# Patient Record
Sex: Female | Born: 1985 | Race: Black or African American | Hispanic: No | Marital: Single | Smoking: Former smoker
Health system: Southern US, Community
[De-identification: ages and names within clinical notes are randomized; demographics above are authoritative.]

## PROBLEM LIST (undated history)

## (undated) ENCOUNTER — Inpatient Hospital Stay (HOSPITAL_COMMUNITY): Payer: Self-pay

## (undated) ENCOUNTER — Emergency Department (HOSPITAL_COMMUNITY): Admission: EM | Discharge status: Against Medical Advice | Payer: Self-pay

## (undated) DIAGNOSIS — R12 Heartburn: Secondary | ICD-10-CM

## (undated) DIAGNOSIS — Z789 Other specified health status: Secondary | ICD-10-CM

## (undated) DIAGNOSIS — O3432 Maternal care for cervical incompetence, second trimester: Secondary | ICD-10-CM

## (undated) DIAGNOSIS — O26899 Other specified pregnancy related conditions, unspecified trimester: Secondary | ICD-10-CM

## (undated) DIAGNOSIS — O3440 Maternal care for other abnormalities of cervix, unspecified trimester: Secondary | ICD-10-CM

## (undated) DIAGNOSIS — O26872 Cervical shortening, second trimester: Secondary | ICD-10-CM

## (undated) DIAGNOSIS — R519 Headache, unspecified: Secondary | ICD-10-CM

## (undated) DIAGNOSIS — O2230 Deep phlebothrombosis in pregnancy, unspecified trimester: Secondary | ICD-10-CM

## (undated) DIAGNOSIS — R51 Headache: Secondary | ICD-10-CM

## (undated) HISTORY — PX: OTHER SURGICAL HISTORY: SHX169

## (undated) HISTORY — PX: INDUCED ABORTION: SHX677

## (undated) HISTORY — PX: AUGMENTATION MAMMAPLASTY: SUR837

## (undated) HISTORY — PX: WISDOM TOOTH EXTRACTION: SHX21

---
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
## 2007-01-24 ENCOUNTER — Emergency Department (HOSPITAL_COMMUNITY): Admission: EM | Admit: 2007-01-24 | Discharge: 2007-01-24 | Payer: Self-pay | Admitting: Emergency Medicine

## 2007-08-02 ENCOUNTER — Ambulatory Visit (HOSPITAL_COMMUNITY): Admission: RE | Admit: 2007-08-02 | Discharge: 2007-08-02 | Payer: Self-pay | Admitting: Obstetrics and Gynecology

## 2007-08-29 ENCOUNTER — Inpatient Hospital Stay (HOSPITAL_COMMUNITY): Admission: AD | Admit: 2007-08-29 | Discharge: 2007-08-29 | Payer: Self-pay | Admitting: Obstetrics and Gynecology

## 2007-09-02 ENCOUNTER — Inpatient Hospital Stay (HOSPITAL_COMMUNITY): Admission: AD | Admit: 2007-09-02 | Discharge: 2007-09-04 | Payer: Self-pay | Admitting: Obstetrics and Gynecology

## 2007-09-02 ENCOUNTER — Encounter (INDEPENDENT_AMBULATORY_CARE_PROVIDER_SITE_OTHER): Payer: Self-pay | Admitting: Obstetrics and Gynecology

## 2008-08-10 ENCOUNTER — Emergency Department (HOSPITAL_COMMUNITY): Admission: EM | Admit: 2008-08-10 | Discharge: 2008-08-10 | Payer: Self-pay | Admitting: Emergency Medicine

## 2010-07-23 ENCOUNTER — Inpatient Hospital Stay (INDEPENDENT_AMBULATORY_CARE_PROVIDER_SITE_OTHER)
Admission: RE | Admit: 2010-07-23 | Discharge: 2010-07-23 | Disposition: A | Discharge status: Home / Self Care | Payer: Self-pay | Source: Ambulatory Visit | Attending: Emergency Medicine | Admitting: Emergency Medicine

## 2010-07-23 DIAGNOSIS — J020 Streptococcal pharyngitis: Secondary | ICD-10-CM

## 2010-07-23 LAB — POCT URINALYSIS DIPSTICK
Bilirubin Urine: NEGATIVE
Hgb urine dipstick: NEGATIVE
Ketones, ur: NEGATIVE mg/dL
Nitrite: NEGATIVE
Protein, ur: 30 mg/dL — AB
Specific Gravity, Urine: 1.025 (ref 1.005–1.030)
Urine Glucose, Fasting: NEGATIVE mg/dL
Urobilinogen, UA: 1 mg/dL (ref 0.0–1.0)
pH: 6 (ref 5.0–8.0)

## 2010-07-23 LAB — POCT RAPID STREP A (OFFICE): Streptococcus, Group A Screen (Direct): POSITIVE — AB

## 2010-07-23 LAB — POCT PREGNANCY, URINE: Preg Test, Ur: NEGATIVE

## 2010-10-21 NOTE — Discharge Summary (Signed)
Erin Boyd, Erin Boyd       ACCOUNT NO.:  [id number]   MEDICAL RECORD NO.:  [id number]          PATIENT TYPE:  INP   LOCATION:  9145                          FACILITY:  [facility]   PHYSICIAN:  Malachi Pro. Ambrose Mantle, M.D. DATE OF BIRTH:  11-21-1985   DATE OF ADMISSION:  09/02/2007  DATE OF DISCHARGE:  09/04/2007                               DISCHARGE SUMMARY   HISTORY OF PRESENT ILLNESS:  A 25 year old black single female para 0-0-  1-0, gravida 2, EDC September 08, 2007, admitted in active labor.  Blood  group and type A positive, negative antibody, nonreactive serology,  rubella none immune, hepatitis B surface antigen negative, HIV negative,  hemoglobin AA, GC and Chlamydia negative, 1-hour glucola 113, group B  strep positive, and at 36 weeks, the Chlamydia was positive.   Ultrasound on February 01, 2007 showed [facility] of September 08, 2007.  Pap smear  was ASCUS-positive, high risk.  Colposcopy showed no significant  lesions.  Ultrasound on April 20, 2007, showed an average gestational  age of [redacted] weeks and 4 days with an EDC of _March 30, 2009.  The patient  was treated with azithromycin on August 08, 2007, for positive Chlamydia.  She began contracting on the day of admission.  Office exam showed the  cervix to be 4 cm.  She came to maternity admission for evaluation.  She  progressed and received an epidural.   PAST MEDICAL HISTORY:  Revealed no known allergies.  No operations, no  illnesses.  She did have a chlamydia in 2008 and again at [redacted] weeks  gestation.   FAMILY HISTORY:  Negative.   SOCIAL HISTORY:  Alcohol, tobacco, and drugs none.   OBSTETRIC HISTORY:  In 2008, she had an early abortion.   PHYSICAL EXAMINATION:  On admission was normal.  VITAL SIGNS:  Normal.  HEART: Normal.  LUNGS: Normal.  ABDOMEN: Soft.  Fundal height 39 cm.  Fetal heart tones normal.  The  cervix was 10 cm when I first examined her in the hospital.  Vertex was  OA at +3 station.   She pushed great and  delivered spontaneously OA over an intact  peritoneum a living female infant of 7 pounds and 7 ounces with Apgars  of 9 at 1 and 9 at 5 minutes.  The placenta was intact.  The uterus was  normal.  Blood loss about 400 mL.  Bilateral labial lacerations were  hemostatic and not repaired.  Postpartum, the patient did well and was  discharged on the second postpartum day.   LABORATORY DATA:  She had an initial hemoglobin of 12.2, hematocrit of  36.3, white count 10,900, and platelet count 180,000.  Followup  hemoglobin 11.3.   FINAL DIAGNOSIS:  Intrauterine pregnancy, 39 weeks, delivered  occipitoanterior.   OPERATION:  Spontaneous delivery OA.   FINAL CONDITION:  Improved.   INSTRUCTIONS:  1. Include a regular discharge instruction booklet.  2. The patient is advised to return to the office in 6 weeks.  3. Percocet 5/325, 30 tablets 1 q.4-6 h. as needed for pain.  4. Motrin 600 mg, 30 tablets 1 q.6 h. as needed for pain  given at      discharge.      Malachi Pro. Ambrose Mantle, M.D.  Electronically Signed     TFH/MEDQ  D:  09/04/2007  T:  09/05/2007  Job:  [id number]

## 2011-02-26 ENCOUNTER — Inpatient Hospital Stay (HOSPITAL_COMMUNITY)
Admission: AD | Admit: 2011-02-26 | Discharge: 2011-02-27 | Disposition: A | Discharge status: Home / Self Care | Payer: Self-pay | Source: Ambulatory Visit | Attending: Obstetrics and Gynecology | Admitting: Obstetrics and Gynecology

## 2011-02-26 DIAGNOSIS — N760 Acute vaginitis: Secondary | ICD-10-CM

## 2011-02-26 DIAGNOSIS — A499 Bacterial infection, unspecified: Secondary | ICD-10-CM

## 2011-02-26 DIAGNOSIS — B9689 Other specified bacterial agents as the cause of diseases classified elsewhere: Secondary | ICD-10-CM

## 2011-02-26 DIAGNOSIS — R109 Unspecified abdominal pain: Secondary | ICD-10-CM | POA: Insufficient documentation

## 2011-02-26 NOTE — Progress Notes (Signed)
Pt LMP 01/09/2011, G2 P1, having lower abd and back pain, pain with urination and vaginal discharge.

## 2011-02-27 ENCOUNTER — Encounter (HOSPITAL_COMMUNITY): Payer: Self-pay | Admitting: *Deleted

## 2011-02-27 LAB — URINALYSIS, ROUTINE W REFLEX MICROSCOPIC
Glucose, UA: NEGATIVE mg/dL
Ketones, ur: NEGATIVE mg/dL
Protein, ur: NEGATIVE mg/dL
Urobilinogen, UA: 1 mg/dL (ref 0.0–1.0)

## 2011-02-27 LAB — CBC
HCT: 39.7 % (ref 36.0–46.0)
MCHC: 32.5 g/dL (ref 30.0–36.0)
MCV: 90.2 fL (ref 78.0–100.0)
RDW: 14 % (ref 11.5–15.5)

## 2011-02-27 LAB — WET PREP, GENITAL

## 2011-02-27 LAB — URINE MICROSCOPIC-ADD ON

## 2011-02-27 MED ORDER — METRONIDAZOLE 500 MG PO TABS: 500.0000 mg | ORAL_TABLET | Freq: Two times a day (BID) | ORAL | Status: AC

## 2011-02-27 MED ORDER — IBUPROFEN 800 MG PO TABS: 800.0000 mg | ORAL_TABLET | Freq: Once | ORAL | Status: DC

## 2011-02-27 NOTE — Progress Notes (Signed)
Patient states she has been having lower abdominal and back pain . Pt also noticed blood in her urine

## 2011-02-27 NOTE — ED Provider Notes (Signed)
History     Chief Complaint  Patient presents with  . Abdominal Pain  . Back Pain  . Dysuria  . Vaginal Discharge   HPI C/O low abd and back pain, + dysuria. No fever, chills, nausea/vomiting. Unsure if pregnant, LMP 02/05/11.   OB History    Grav Para Term Preterm Abortions TAB SAB Ect Mult Living   2 1 1  0 1 0 1 0 0 1      No past medical history on file.  Past Surgical History  Procedure Date  . Pediatric dental surgery     No family history on file.  History  Substance Use Topics  . Smoking status: Current Everyday Smoker -- 0.2 packs/day  . Smokeless tobacco: Never Used  . Alcohol Use: 2.4 oz/week    4 Glasses of wine per week    Allergies: No Known Allergies  No prescriptions prior to admission    Review of Systems  Constitutional: Negative.  Negative for fever and chills.  Respiratory: Negative.   Cardiovascular: Negative.   Gastrointestinal: Positive for abdominal pain. Negative for nausea, vomiting, diarrhea and constipation.  Genitourinary: Positive for dysuria and hematuria. Negative for urgency, frequency and flank pain.       Negative for vaginal bleeding, vaginal discharge, dyspareunia  Musculoskeletal: Positive for back pain.  Neurological: Negative.   Psychiatric/Behavioral: Negative.    Physical Exam   Blood pressure 123/67, pulse 69, temperature 98.8 F (37.1 C), temperature source Oral, resp. rate 18, height 5' 9.5" (1.765 m), weight 98.068 kg (216 lb 3.2 oz), last menstrual period 01/09/2011.  Physical Exam  Nursing note and vitals reviewed. Constitutional: She is oriented to person, place, and time. She appears well-developed and well-nourished. No distress.  HENT:  Head: Normocephalic and atraumatic.  Cardiovascular: Normal rate.   Respiratory: Effort normal.  GI: Soft. Bowel sounds are normal. She exhibits no mass. There is no tenderness. There is no rebound and no guarding.  Genitourinary: There is no rash or lesion on the right  labia. There is no rash or lesion on the left labia. Uterus is not enlarged and not tender. Cervix exhibits no motion tenderness, no discharge and no friability. Right adnexum displays no mass, no tenderness and no fullness. Left adnexum displays no mass, no tenderness and no fullness. No tenderness or bleeding around the vagina. Vaginal discharge (white) found.  Musculoskeletal: Normal range of motion.  Neurological: She is alert and oriented to person, place, and time.  Skin: Skin is warm and dry.  Psychiatric: She has a normal mood and affect.    MAU Course  Procedures  Results for orders placed during the hospital encounter of 02/26/11 (from the past 24 hour(s))  URINALYSIS, ROUTINE W REFLEX MICROSCOPIC     Status: Abnormal   Collection Time   02/26/11 11:50 PM      Component Value Range   Color, Urine YELLOW  YELLOW    Appearance HAZY (*) CLEAR    Specific Gravity, Urine 1.015  1.005 - 1.030    pH 7.5  5.0 - 8.0    Glucose, UA NEGATIVE  NEGATIVE (mg/dL)   Hgb urine dipstick SMALL (*) NEGATIVE    Bilirubin Urine NEGATIVE  NEGATIVE    Ketones, ur NEGATIVE  NEGATIVE (mg/dL)   Protein, ur NEGATIVE  NEGATIVE (mg/dL)   Urobilinogen, UA 1.0  0.0 - 1.0 (mg/dL)   Nitrite NEGATIVE  NEGATIVE    Leukocytes, UA SMALL (*) NEGATIVE   URINE MICROSCOPIC-ADD ON  Status: Abnormal   Collection Time   02/26/11 11:50 PM      Component Value Range   Squamous Epithelial / LPF FEW (*) RARE    WBC, UA 11-20  <3 (WBC/hpf)   RBC / HPF 3-6  <3 (RBC/hpf)   Bacteria, UA MANY (*) RARE   CBC     Status: Normal   Collection Time   02/27/11  1:05 AM      Component Value Range   WBC 8.2  4.0 - 10.5 (K/uL)   RBC 4.40  3.87 - 5.11 (MIL/uL)   Hemoglobin 12.9  12.0 - 15.0 (g/dL)   HCT 16.1  09.6 - 04.5 (%)   MCV 90.2  78.0 - 100.0 (fL)   MCH 29.3  26.0 - 34.0 (pg)   MCHC 32.5  30.0 - 36.0 (g/dL)   RDW 40.9  81.1 - 91.4 (%)   Platelets 183  150 - 400 (K/uL)  WET PREP, GENITAL     Status: Abnormal    Collection Time   02/27/11  1:20 AM      Component Value Range   Yeast, Wet Prep NONE SEEN  NONE SEEN    Trich, Wet Prep NONE SEEN  NONE SEEN    Clue Cells, Wet Prep MODERATE (*) NONE SEEN    WBC, Wet Prep HPF POC MODERATE (*) NONE SEEN   POCT PREGNANCY, URINE     Status: Normal   Collection Time   02/27/11  1:52 AM      Component Value Range   Preg Test, Ur NEGATIVE       Assessment and Plan  Bacterial vaginosis - rx flagyl F/U PRN   Abdelaziz Westenberger 02/27/2011, 3:32 AM

## 2011-02-28 LAB — GC/CHLAMYDIA PROBE AMP, GENITAL: Chlamydia, DNA Probe: NEGATIVE

## 2011-03-02 LAB — CBC
HCT: 33 — ABNORMAL LOW
HCT: 36.3
Hemoglobin: 11.3 — ABNORMAL LOW
MCV: 88.9
RBC: 3.74 — ABNORMAL LOW
RBC: 4.08
RDW: 14.4
WBC: 10.9 — ABNORMAL HIGH

## 2011-03-02 LAB — RPR: RPR Ser Ql: NONREACTIVE

## 2011-03-04 NOTE — ED Provider Notes (Signed)
Agree with above note.  Erin Boyd 03/04/2011 9:11 AM

## 2011-04-15 ENCOUNTER — Emergency Department (INDEPENDENT_AMBULATORY_CARE_PROVIDER_SITE_OTHER)
Admission: EM | Admit: 2011-04-15 | Discharge: 2011-04-15 | Disposition: A | Discharge status: Home / Self Care | Payer: Self-pay | Source: Home / Self Care | Attending: Emergency Medicine | Admitting: Emergency Medicine

## 2011-04-15 ENCOUNTER — Encounter (HOSPITAL_COMMUNITY): Payer: Self-pay | Admitting: *Deleted

## 2011-04-15 DIAGNOSIS — N898 Other specified noninflammatory disorders of vagina: Secondary | ICD-10-CM

## 2011-04-15 DIAGNOSIS — R05 Cough: Secondary | ICD-10-CM

## 2011-04-15 DIAGNOSIS — M778 Other enthesopathies, not elsewhere classified: Secondary | ICD-10-CM

## 2011-04-15 DIAGNOSIS — R059 Cough, unspecified: Secondary | ICD-10-CM

## 2011-04-15 DIAGNOSIS — M65849 Other synovitis and tenosynovitis, unspecified hand: Secondary | ICD-10-CM

## 2011-04-15 LAB — POCT URINALYSIS DIP (DEVICE)
Ketones, ur: NEGATIVE mg/dL
Leukocytes, UA: NEGATIVE
Nitrite: NEGATIVE
Protein, ur: NEGATIVE mg/dL
Urobilinogen, UA: 1 mg/dL (ref 0.0–1.0)
pH: 5.5 (ref 5.0–8.0)

## 2011-04-15 LAB — WET PREP, GENITAL
Trich, Wet Prep: NONE SEEN
Yeast Wet Prep HPF POC: NONE SEEN

## 2011-04-15 LAB — POCT PREGNANCY, URINE: Preg Test, Ur: NEGATIVE

## 2011-04-15 MED ORDER — DICLOFENAC SODIUM 75 MG PO TBEC: 75.0000 mg | DELAYED_RELEASE_TABLET | ORAL | Status: DC

## 2011-04-15 MED ORDER — BENZONATATE 100 MG PO CAPS: 100.0000 mg | ORAL_CAPSULE | Freq: Three times a day (TID) | ORAL | Status: AC | PRN

## 2011-04-15 MED ORDER — METRONIDAZOLE 500 MG PO TABS: 500.0000 mg | ORAL_TABLET | Freq: Two times a day (BID) | ORAL | Status: AC

## 2011-04-15 NOTE — ED Provider Notes (Addendum)
History     CSN: [id number] Arrival date & time: 04/15/2011 11:08 AM   First MD Initiated Contact with Patient 04/15/11 1124      Chief Complaint  Patient presents with  . Vaginal Discharge    pt with onset of vaginal discharge with odor x 5 days - dry cough x 2 days and pain right thumb  -  . Cough   COUGH, x 1 day "dry",,,,no fevers no congestion (CHPI Comments: R thumb pain and swelling...with tingling sensation points to dorsal aspect ...of R thumb, the swelling has gone down.....     Patient is a 25 y.o. female presenting with vaginal discharge, cough, and vaginal itching. The history is provided by the patient.  Vaginal Discharge This is a new problem. The current episode started more than 1 week ago. The problem occurs constantly. The problem has not changed since onset.Pertinent negatives include no chest pain, no abdominal pain and no headaches. The symptoms are aggravated by nothing. She has tried nothing for the symptoms. The treatment provided no relief.  Cough This is a new problem. The current episode started 12 to 24 hours ago. The problem has not changed since onset.There has been no fever. Pertinent negatives include no chest pain, no chills, no ear congestion, no headaches and no myalgias. She has tried nothing for the symptoms. She is not a smoker.  Vaginal Itching This is a new problem. The problem occurs constantly. Pertinent negatives include no chest pain, no abdominal pain and no headaches.   Having a strong odor with this discharge,,, History reviewed. No pertinent past medical history.  Past Surgical History  Procedure Date  . Pediatric dental surgery     History reviewed. No pertinent family history.  History  Substance Use Topics  . Smoking status: Current Everyday Smoker -- 0.2 packs/day  . Smokeless tobacco: Never Used  . Alcohol Use: 2.4 oz/week    4 Glasses of wine per week    OB History    Grav Para Term Preterm Abortions TAB SAB Ect Mult  Living   2 1 1  0 1 0 1 0 0 1      Review of Systems  Constitutional: Negative.  Negative for fever and chills.  Respiratory: Positive for cough.   Cardiovascular: Negative for chest pain.  Gastrointestinal: Negative for abdominal pain.  Genitourinary: Positive for vaginal discharge.  Musculoskeletal: Negative for myalgias.  Neurological: Negative for weakness and headaches.    Allergies  Review of patient's allergies indicates no known allergies.  Home Medications  No current outpatient prescriptions on file.  LMP 03/25/2011  Physical Exam  Nursing note reviewed. Constitutional: She appears well-developed and well-nourished.  Genitourinary: Vaginal discharge found.  Musculoskeletal:       Right wrist: She exhibits tenderness. She exhibits normal range of motion, no crepitus and no deformity.       Arms: Neurological: She is alert.  Skin: Skin is warm. No erythema.    ED Course  Procedures (including critical care time)   Labs Reviewed  POCT URINALYSIS DIPSTICK  POCT PREGNANCY, URINE  WET PREP, GENITAL  GC/CHLAMYDIA PROBE AMP, GENITAL   No results found.   No diagnosis found.    MDM  3 CONDITIONS- COUGH < 24 HRS- THUMB PAIN- AND VAGINAL DISCHARGE        Jimmie Molly, MD 04/15/11 1224  Jimmie Molly, MD 04/15/11 1227  Jimmie Molly, MD 04/15/11 1749  Jimmie Molly, MD 04/15/11 1752  Jimmie Molly, MD 04/15/11 1755

## 2011-04-16 LAB — GC/CHLAMYDIA PROBE AMP, GENITAL
Chlamydia, DNA Probe: NEGATIVE
GC Probe Amp, Genital: NEGATIVE

## 2011-04-17 NOTE — ED Notes (Signed)
GC neg., Chlamydia neg., Wet prep: Mod. Clue cells, mod. WBC's. Pt. adequately treated with Flagyl. No further treatment needed.

## 2011-06-03 ENCOUNTER — Inpatient Hospital Stay (HOSPITAL_BASED_OUTPATIENT_CLINIC_OR_DEPARTMENT_OTHER)
Admission: EM | Admit: 2011-06-03 | Discharge: 2011-06-03 | Disposition: A | Discharge status: Home / Self Care | Payer: Self-pay | Attending: Obstetrics and Gynecology | Admitting: Obstetrics and Gynecology

## 2011-06-03 ENCOUNTER — Encounter (HOSPITAL_COMMUNITY): Payer: Self-pay | Admitting: *Deleted

## 2011-06-03 ENCOUNTER — Inpatient Hospital Stay (HOSPITAL_COMMUNITY): Payer: Self-pay

## 2011-06-03 ENCOUNTER — Other Ambulatory Visit: Payer: Self-pay | Admitting: Obstetrics and Gynecology

## 2011-06-03 ENCOUNTER — Inpatient Hospital Stay (HOSPITAL_COMMUNITY)
Admission: AD | Admit: 2011-06-03 | Discharge: 2011-06-03 | Disposition: A | Discharge status: Home / Self Care | Payer: Self-pay | Source: Ambulatory Visit | Attending: Obstetrics and Gynecology | Admitting: Obstetrics and Gynecology

## 2011-06-03 ENCOUNTER — Encounter (HOSPITAL_BASED_OUTPATIENT_CLINIC_OR_DEPARTMENT_OTHER): Payer: Self-pay | Admitting: *Deleted

## 2011-06-03 DIAGNOSIS — O209 Hemorrhage in early pregnancy, unspecified: Secondary | ICD-10-CM | POA: Insufficient documentation

## 2011-06-03 DIAGNOSIS — O26899 Other specified pregnancy related conditions, unspecified trimester: Secondary | ICD-10-CM

## 2011-06-03 DIAGNOSIS — R102 Pelvic and perineal pain: Secondary | ICD-10-CM

## 2011-06-03 DIAGNOSIS — N949 Unspecified condition associated with female genital organs and menstrual cycle: Secondary | ICD-10-CM

## 2011-06-03 HISTORY — DX: Other specified health status: Z78.9

## 2011-06-03 LAB — CBC
HCT: 37 % (ref 36.0–46.0)
MCH: 30.6 pg (ref 26.0–34.0)
MCV: 87.7 fL (ref 78.0–100.0)
MCV: 89.4 fL (ref 78.0–100.0)
Platelets: 169 10*3/uL (ref 150–400)
RBC: 4.22 MIL/uL (ref 3.87–5.11)
RDW: 13.2 % (ref 11.5–15.5)
RDW: 13.2 % (ref 11.5–15.5)
WBC: 7.8 10*3/uL (ref 4.0–10.5)
WBC: 7.9 10*3/uL (ref 4.0–10.5)

## 2011-06-03 LAB — WET PREP, GENITAL
Clue Cells Wet Prep HPF POC: NONE SEEN
Trich, Wet Prep: NONE SEEN
Yeast Wet Prep HPF POC: NONE SEEN

## 2011-06-03 LAB — URINALYSIS, ROUTINE W REFLEX MICROSCOPIC
Bilirubin Urine: NEGATIVE
Glucose, UA: NEGATIVE mg/dL
Ketones, ur: NEGATIVE mg/dL
Protein, ur: NEGATIVE mg/dL
Urobilinogen, UA: 1 mg/dL (ref 0.0–1.0)

## 2011-06-03 LAB — BASIC METABOLIC PANEL
CO2: 22 mEq/L (ref 19–32)
Calcium: 9.1 mg/dL (ref 8.4–10.5)
Creatinine, Ser: 0.6 mg/dL (ref 0.50–1.10)
GFR calc Af Amer: >90 mL/min (ref >90)
GFR calc non Af Amer: >90 mL/min (ref >90)
Sodium: 138 mEq/L (ref 135–145)

## 2011-06-03 LAB — DIFFERENTIAL
Basophils Absolute: 0 10*3/uL (ref 0.0–0.1)
Eosinophils Relative: 2 % (ref 0–5)
Lymphocytes Relative: 39 % (ref 12–46)
Lymphs Abs: 3.1 10*3/uL (ref 0.7–4.0)
Monocytes Absolute: 0.8 10*3/uL (ref 0.1–1.0)
Neutro Abs: 3.8 10*3/uL (ref 1.7–7.7)

## 2011-06-03 LAB — PROTIME-INR: Prothrombin Time: 12.8 seconds (ref 11.6–15.2)

## 2011-06-03 LAB — ABO/RH: ABO/RH(D): A POS

## 2011-06-03 LAB — URINE MICROSCOPIC-ADD ON

## 2011-06-03 MED ORDER — ONDANSETRON HCL 4 MG/2ML IJ SOLN
4.0000 mg | Freq: Once | INTRAMUSCULAR | Status: AC
  Administered 2011-06-03: 4 mg via INTRAVENOUS

## 2011-06-03 MED ORDER — KETOROLAC TROMETHAMINE 60 MG/2ML IM SOLN
60.0000 mg | Freq: Once | INTRAMUSCULAR | Status: AC
  Administered 2011-06-03: 60 mg via INTRAMUSCULAR
  Filled 2011-06-03: qty 2

## 2011-06-03 MED ORDER — OXYCODONE-ACETAMINOPHEN 5-325 MG PO TABS: 1.0000 | ORAL_TABLET | ORAL | Status: AC | PRN

## 2011-06-03 MED ORDER — ONDANSETRON HCL 4 MG/2ML IJ SOLN
INTRAMUSCULAR | Status: AC
  Administered 2011-06-03: 4 mg via INTRAVENOUS
  Filled 2011-06-03: qty 2

## 2011-06-03 MED ORDER — MORPHINE SULFATE 4 MG/ML IJ SOLN
4.0000 mg | Freq: Once | INTRAMUSCULAR | Status: AC
  Administered 2011-06-03: 4 mg via INTRAVENOUS
  Filled 2011-06-03: qty 1

## 2011-06-03 NOTE — ED Provider Notes (Signed)
History     Chief Complaint  Patient presents with  . Abdominal Pain  . Vaginal Bleeding   HPI Erin Boyd 25 y.o. G3P1 at [redacted]w[redacted]d sent from [facility] for ultrasound and further evaluation.  [facility] had consulted with Dr. Senaida Ores who accepted client for transfer.   OB History    Grav Para Term Preterm Abortions TAB SAB Ect Mult Living   3 1 1  0 1 0 1 0 0 1      History reviewed. No pertinent past medical history.  Past Surgical History  Procedure Date  . Pediatric dental surgery     History reviewed. No pertinent family history.  History  Substance Use Topics  . Smoking status: Current Everyday Smoker -- 0.2 packs/day  . Smokeless tobacco: Never Used  . Alcohol Use: 2.4 oz/week    4 Glasses of wine per week    Allergies: No Known Allergies   (Not in a hospital admission)  Review of Systems  Gastrointestinal: Positive for abdominal pain. Negative for nausea, vomiting and diarrhea.   Physical Exam   Blood pressure 114/52, pulse 68, temperature 98.8 F (37.1 C), temperature source Oral, resp. rate 18, height 5\' 9"  (1.753 m), weight 220 lb (99.791 kg), last menstrual period 03/28/2011, SpO2 98.00%.  Physical Exam  Nursing note and vitals reviewed. Constitutional: She is oriented to person, place, and time. She appears well-developed and well-nourished.       Client very drowsy.    HENT:  Head: Normocephalic.  Eyes: EOM are normal.  Neck: Neck supple.  Musculoskeletal: Normal range of motion.  Neurological: She is alert and oriented to person, place, and time.  Skin: Skin is warm and dry.  Psychiatric: She has a normal mood and affect.    MAU Course  Procedures TRANSVAGINAL OBSTETRIC US  Technique: Transvaginal ultrasound was performed for complete  evaluation of the gestation as well as the maternal uterus, adnexal  regions, and pelvic cul-de-sac.  Comparison: Prior ultrasound of pregnancy performed 06/30/2006    Intrauterine gestational sac: Visualized/normal in shape.  Yolk sac: No  Embryo: No  Cardiac Activity: N/A  MSD: 10.9 mm 5 w 6 d [place] EDC: 01/28/2012  Subchorionic hemorrhage: None seen.  Maternal uterus/adnexae:  The uterus is otherwise unremarkable in appearance.  The patient demonstrates significant left-sided tenderness.  Thorough evaluation of the left adnexa demonstrates no evidence for  ectopic pregnancy.  The ovaries are unremarkable in appearance. The right ovary  measures 3.6 x 2.7 x 1.6 cm, while the left ovary measures 3.7 x  2.3 x 2.2 cm. There is no evidence for ovarian torsion; no  suspicious adnexal masses are seen.  No free fluid is seen within the pelvic cul-de-sac.  IMPRESSION:  1. Single intrauterine gestational sac noted, with a mean sac  diameter of 10.9 mm. No embryo is yet seen. This corresponds to a  gestational age of [redacted] weeks 6 days, which does not match the  gestational age by LMP, and reflects a new estimated date of  delivery of January 28, 2012.  2. Significant left-sided adnexal tenderness; no corresponding  abnormality characterized on ultrasound. No evidence for ectopic  pregnancy at this time  MDM Dr. Ambrose Mantle in to see client and discussed plan of care.  Client decided to be seen in the office on Friday for repeat quant there.  Assessment and Plan  Bleeding in early pregnancy.  Plan Be seen in the office on Friday for repeat quant.  Call the office today to schedule an appointment. Note for work given for today. BURLESON,TERRI 06/03/2011, 8:43 AM   Nolene Bernheim, NP 06/03/11 [phone]

## 2011-06-03 NOTE — Progress Notes (Signed)
Pt presents to [facility].  Arrives for lower abdominal pain and cramping and bleeding that started around 11pm last night.

## 2011-06-03 NOTE — Progress Notes (Signed)
Pt states was seen this am for bleeding, now having clots. Noted clot tennis ball sized. Changing pad q30 minutes.

## 2011-06-03 NOTE — ED Notes (Signed)
Pt given zofran prior to transfer d/t nausea.

## 2011-06-03 NOTE — ED Provider Notes (Signed)
History     Chief Complaint  Patient presents with  . Vaginal Bleeding   HPI Erin Boyd 25 y.o. left earlier this morning. 5w 6d gestation.   Went to bed and slept.  Awakened with heavy bleeding and clotting.  Returns for further evaluation.   OB History    Grav Para Term Preterm Abortions TAB SAB Ect Mult Living   4 1 1  0 1 0 1 0 0 1      Past Medical History  Diagnosis Date  . No pertinent past medical history     Past Surgical History  Procedure Date  . Pediatric dental surgery   . Induced abortion     No family history on file.  History  Substance Use Topics  . Smoking status: Current Everyday Smoker -- 0.2 packs/day  . Smokeless tobacco: Never Used  . Alcohol Use: 2.4 oz/week    4 Glasses of wine per week    Allergies: No Known Allergies  Prescriptions prior to admission  Medication Sig Dispense Refill  . ibuprofen (ADVIL,MOTRIN) 200 MG tablet Take 400 mg by mouth every 6 (six) hours as needed. Takes for headache         ROS Physical Exam   Blood pressure 117/55, pulse 69, temperature 98.5 F (36.9 C), resp. rate 18, height 5\' 9"  (1.753 m), weight 220 lb 6 oz (99.961 kg), last menstrual period 03/28/2011.  Physical Exam  MAU Course  Procedures  MDM Care assumed by M. Mayford Knife, CNM  Assessment and Plan    Ugo Thoma 06/03/2011, 7:40 PM   Nolene Bernheim, NP 06/03/11 2000

## 2011-06-03 NOTE — ED Notes (Signed)
Pt was seen in MAU earlier today with u/s and labs done; pt continues to have moderate bleeding and cramping; pt rates her cramps at 5-10; pt had intercourse yesterday; G4P1;

## 2011-06-03 NOTE — ED Provider Notes (Addendum)
History     CSN: [id number]  Arrival date & time 06/03/11  0051   First MD Initiated Contact with Patient 06/03/11 [phone]      Chief Complaint  Patient presents with  . Abdominal Pain  . Vaginal Bleeding    (Consider location/radiation/quality/duration/timing/severity/associated sxs/prior treatment) HPI Comments: The patient is a 25 year old female, G3 P1 A2 (1 spontaneous miscarriage, one induced abortion) with a last menstrual period in late October who tells me that she took a urine pregnancy test a month ago that was positive. She reports that 2 weeks ago she was having some brown vaginal discharge that stopped after a day, and 2 days ago she said that she expelled a "slimy plug" from her vagina, and reports that today at approximately 11 PM, she had onset of left lower abdominal pain, sharp, cramping, and vaginal bleeding with flow approximately equal to the menstrual period for her. She denies any abdominal pain otherwise, nausea, vomiting, diarrhea, reports last normal bowel movement yesterday morning. She is awake, alert, and oriented appropriately, with warm and dry skin, in no apparent distress. Vital signs are stable.  Patient is a 25 y.o. female presenting with abdominal pain and vaginal bleeding. The history is provided by the patient.  Abdominal Pain The primary symptoms of the illness include abdominal pain and vaginal bleeding. The primary symptoms of the illness do not include fever, fatigue, shortness of breath, nausea, vomiting, diarrhea, dysuria or vaginal discharge.  Symptoms associated with the illness do not include chills, diaphoresis, urgency, hematuria or frequency.  Vaginal Bleeding Associated symptoms include abdominal pain. Pertinent negatives include no chest pain, no headaches and no shortness of breath.    History reviewed. No pertinent past medical history.  Past Surgical History  Procedure Date  . Pediatric dental surgery     History reviewed. No  pertinent family history.  History  Substance Use Topics  . Smoking status: Current Everyday Smoker -- 0.2 packs/day  . Smokeless tobacco: Never Used  . Alcohol Use: 2.4 oz/week    4 Glasses of wine per week    OB History    Grav Para Term Preterm Abortions TAB SAB Ect Mult Living   3 1 1  0 1 0 1 0 0 1      Review of Systems  Constitutional: Negative for fever, chills, diaphoresis, activity change, appetite change, fatigue and unexpected weight change.  HENT: Negative for facial swelling.   Eyes: Negative.   Respiratory: Negative.  Negative for cough and shortness of breath.   Cardiovascular: Negative.  Negative for chest pain.  Gastrointestinal: Positive for abdominal pain. Negative for nausea, vomiting, diarrhea, blood in stool, abdominal distention, anal bleeding and rectal pain.  Genitourinary: Positive for vaginal bleeding and menstrual problem. Negative for dysuria, urgency, frequency, hematuria, flank pain, decreased urine volume, vaginal discharge, difficulty urinating, genital sores, vaginal pain and pelvic pain.  Musculoskeletal: Negative.   Skin: Negative for color change, pallor and rash.  Neurological: Negative for weakness, light-headedness, numbness and headaches.  Hematological: Does not bruise/bleed easily.  Psychiatric/Behavioral: Negative.     Allergies  Review of patient's allergies indicates no known allergies.  Home Medications   Current Outpatient Rx  Name Route Sig Dispense Refill  . DICLOFENAC SODIUM 75 MG PO TBEC Oral Take 1 tablet (75 mg total) by mouth 1 day or 1 dose. 10 tablet 0    BP 129/61  Pulse 90  Temp(Src) 97.9 F (36.6 C) (Oral)  Resp 18  Ht 5\' 9"  (1.753 m)  Wt 220 lb (99.791 kg)  BMI 32.49 kg/m2  SpO2 99%  LMP 03/28/2011  Physical Exam  Nursing note and vitals reviewed. Constitutional: She is oriented to person, place, and time. She appears well-developed and well-nourished. No distress.  HENT:  Head: Normocephalic and  atraumatic.  Nose: Nose normal.  Mouth/Throat: Oropharynx is clear and moist. No oropharyngeal exudate.  Eyes: Conjunctivae and EOM are normal. Pupils are equal, round, and reactive to light. Right eye exhibits no discharge. Left eye exhibits no discharge.  Neck: Normal range of motion. Neck supple. No JVD present.  Cardiovascular: Normal rate, regular rhythm, normal heart sounds and intact distal pulses.  Exam reveals no gallop and no friction rub.   No murmur heard. Pulmonary/Chest: Effort normal and breath sounds normal. No respiratory distress. She has no wheezes. She has no rales. She exhibits no tenderness.  Abdominal: Soft. Bowel sounds are normal. She exhibits no distension, no fluid wave, no ascites and no mass. There is tenderness in the left lower quadrant. There is no rebound, no guarding and no CVA tenderness.  Genitourinary: Uterus normal. Pelvic exam was performed with patient supine. There is no rash, tenderness or lesion on the right labia. There is no rash, tenderness or lesion on the left labia. Uterus is not enlarged and not tender. Cervix exhibits no motion tenderness, no discharge and no friability. Right adnexum displays no mass, no tenderness and no fullness. Left adnexum displays tenderness and fullness. Left adnexum displays no mass. There is bleeding around the vagina. No erythema or tenderness around the vagina. No signs of injury around the vagina. No vaginal discharge found.  Musculoskeletal: Normal range of motion. She exhibits no edema and no tenderness.  Neurological: She is alert and oriented to person, place, and time. No cranial nerve deficit. She exhibits normal muscle tone. Coordination normal.  Skin: Skin is warm and dry. No rash noted. No erythema. No pallor.  Psychiatric: She has a normal mood and affect. Her behavior is normal. Judgment and thought content normal.    ED Course  Procedures (including critical care time)  Labs Reviewed  URINALYSIS, ROUTINE  W REFLEX MICROSCOPIC - Abnormal; Notable for the following:    Hgb urine dipstick LARGE (*)    All other components within normal limits  URINE MICROSCOPIC-ADD ON - Abnormal; Notable for the following:    Bacteria, UA FEW (*)    All other components within normal limits  PREGNANCY, URINE  CBC  DIFFERENTIAL  BASIC METABOLIC PANEL  PROTIME-INR  APTT  HCG, QUANTITATIVE, PREGNANCY  ABO/RH   No results found.   No diagnosis found.    MDM  The differential diagnosis for this patient includes spontaneous miscarriage of an intrauterine pregnancy, but more worrisome, the possibility of a left sided ectopic pregnancy. The patient will require transfer to [facility] emergency Department for any emergent ultrasound to exclude this possibility.   4:14 AM I spoke by telephone with on-call OB/GYN Dr. Senaida Ores at [facility] about the patient's presentation, evaluation, and my concern for ectopic pregnancy. Dr. Senaida Ores states her agreement with that concern as being appropriate as well as with having the patient transferred emergently to [facility] for further evaluation including ultrasound to evaluate for ectopic pregnancy. I've inform the patient of this treatment plan and my concern behind it, and she states her understanding of and agreement with this plan of care.     Felisa Bonier, MD 06/03/11 1610  Felisa Bonier, MD 06/03/11 [phone]

## 2011-06-03 NOTE — ED Provider Notes (Signed)
History     Chief Complaint  Patient presents with  . Vaginal Bleeding   HPI    Chief Complaint   Patient presents with   .  Vaginal Bleeding   HPI  Erin Boyd 25 y.o. left earlier this morning. 5w 6d gestation. Went to bed and slept. Awakened with heavy bleeding and clotting. Returns for further evaluation.   OB History    Grav Para Term Preterm Abortions TAB SAB Ect Mult Living   4 1 1  0 1 0 1 0 0 1      Past Medical History  Diagnosis Date  . No pertinent past medical history     Past Surgical History  Procedure Date  . Pediatric dental surgery   . Induced abortion     No family history on file.  History  Substance Use Topics  . Smoking status: Current Everyday Smoker -- 0.2 packs/day  . Smokeless tobacco: Never Used  . Alcohol Use: 2.4 oz/week    4 Glasses of wine per week    Allergies: No Known Allergies  Prescriptions prior to admission  Medication Sig Dispense Refill  . ibuprofen (ADVIL,MOTRIN) 200 MG tablet Take 400 mg by mouth every 6 (six) hours as needed. Takes for headache         ROS See above  Physical Exam   Blood pressure 117/55, pulse 69, temperature 98.5 F (36.9 C), resp. rate 18, height 5\' 9"  (1.753 m), weight 220 lb 6 oz (99.961 kg), last menstrual period 03/28/2011.  Physical Exam  Constitutional: She is oriented to person, place, and time. She appears well-developed and well-nourished. No distress.  HENT:  Head: Normocephalic.  Cardiovascular: Normal rate.   Respiratory: Effort normal.  GI: Soft. She exhibits no distension. There is no tenderness. There is no rebound and no guarding.  Genitourinary: Vaginal discharge (Large clot/tissue in vault. Cervix open one centimeter, long, firm. Uterus small.) found.  Musculoskeletal: Normal range of motion.  Neurological: She is alert and oriented to person, place, and time.  Skin: Skin is warm and dry.  Psychiatric: She has a normal mood and affect.   Results for orders  placed during the hospital encounter of 06/03/11 (from the past 24 hour(s))  CBC     Status: Normal   Collection Time   06/03/11  7:49 PM      Component Value Range   WBC 7.9  4.0 - 10.5 (K/uL)   RBC 4.32  3.87 - 5.11 (MIL/uL)   Hemoglobin 13.2  12.0 - 15.0 (g/dL)   HCT 16.1  09.6 - 04.5 (%)   MCV 89.4  78.0 - 100.0 (fL)   MCH 30.6  26.0 - 34.0 (pg)   MCHC 34.2  30.0 - 36.0 (g/dL)   RDW 40.9  81.1 - 91.4 (%)   Platelets 169  150 - 400 (K/uL)   MAU Course  Procedures  Assessment and Plan  A:  Probably incomplete Abortion at [redacted]w[redacted]d P:  Clot/tissue sent to pathology       Repeat Quant tomorrow.Pt to call office to see if they want to do the quant there or here.       Bleeding precautions      Plan reviewed with Dr Bedelia Person 06/03/2011, 8:47 PM

## 2011-06-03 NOTE — ED Notes (Signed)
C/o lower abd pain with bleeding x 1 hour. Pt states she had + home pregnancy test in November.

## 2011-06-03 NOTE — ED Notes (Signed)
Pt has an OBGYN but has not scheduled an appt for her first OB visit d/t she does not have medicaid at this time. Pt is waiting for it to go through

## 2011-06-03 NOTE — Progress Notes (Signed)
Jenean Lindau, PA at bedside for medical screening.

## 2011-06-03 NOTE — ED Notes (Signed)
Pt is unknown weeks pregnant, but states she is less than two months probably. States approx 2 weeks, she noticed one episode of brownish vaginal discharge, but it went away. Then three days ago she noticed a gush of clear thick fluid. Yesterday she had nausea and a headache all day. Then today, she noticed a moderate amount of bright red bleeding with lower abdominal cramping. Pt is G4P1-one elective abortion and one spontaneous.

## 2011-06-03 NOTE — ED Notes (Signed)
Report given to [facility] on MAU at [facility]

## 2011-06-03 NOTE — Progress Notes (Signed)
Moderate amount of blood noted on pad in room 9.

## 2011-06-03 NOTE — Progress Notes (Signed)
Dr. Senaida Ores notified of pt arrival. Notified of quant result.  Orders for transvaginal US. Call with [place] results.

## 2011-06-05 ENCOUNTER — Inpatient Hospital Stay (HOSPITAL_COMMUNITY)
Admission: AD | Admit: 2011-06-05 | Discharge: 2011-06-05 | Disposition: A | Discharge status: Home / Self Care | Payer: Self-pay | Source: Ambulatory Visit | Attending: Obstetrics and Gynecology | Admitting: Obstetrics and Gynecology

## 2011-06-05 DIAGNOSIS — O039 Complete or unspecified spontaneous abortion without complication: Secondary | ICD-10-CM | POA: Insufficient documentation

## 2011-06-05 LAB — HCG, QUANTITATIVE, PREGNANCY: hCG, Beta Chain, Quant, S: 511 m[IU]/mL — ABNORMAL HIGH (ref <5)

## 2011-06-05 NOTE — Progress Notes (Signed)
Patient to MAU for repeat BHCG. States she is having mild cramping and slight spotting. Had a clot last night and the bleeding slowed down.

## 2012-07-11 ENCOUNTER — Encounter (HOSPITAL_BASED_OUTPATIENT_CLINIC_OR_DEPARTMENT_OTHER): Payer: Self-pay | Admitting: *Deleted

## 2012-07-11 ENCOUNTER — Emergency Department (HOSPITAL_BASED_OUTPATIENT_CLINIC_OR_DEPARTMENT_OTHER)
Admission: EM | Admit: 2012-07-11 | Discharge: 2012-07-11 | Disposition: A | Discharge status: Home / Self Care | Payer: Self-pay | Attending: Emergency Medicine | Admitting: Emergency Medicine

## 2012-07-11 ENCOUNTER — Emergency Department (HOSPITAL_BASED_OUTPATIENT_CLINIC_OR_DEPARTMENT_OTHER): Payer: Self-pay

## 2012-07-11 DIAGNOSIS — R1032 Left lower quadrant pain: Secondary | ICD-10-CM | POA: Insufficient documentation

## 2012-07-11 DIAGNOSIS — Z3202 Encounter for pregnancy test, result negative: Secondary | ICD-10-CM | POA: Insufficient documentation

## 2012-07-11 DIAGNOSIS — R109 Unspecified abdominal pain: Secondary | ICD-10-CM

## 2012-07-11 DIAGNOSIS — F17200 Nicotine dependence, unspecified, uncomplicated: Secondary | ICD-10-CM | POA: Insufficient documentation

## 2012-07-11 LAB — URINALYSIS, ROUTINE W REFLEX MICROSCOPIC
Bilirubin Urine: NEGATIVE
Hgb urine dipstick: NEGATIVE
Protein, ur: NEGATIVE mg/dL
Urobilinogen, UA: 0.2 mg/dL (ref 0.0–1.0)

## 2012-07-11 LAB — COMPREHENSIVE METABOLIC PANEL
BUN: 7 mg/dL (ref 6–23)
CO2: 21 mEq/L (ref 19–32)
Calcium: 9.4 mg/dL (ref 8.4–10.5)
Creatinine, Ser: 0.7 mg/dL (ref 0.50–1.10)
GFR calc Af Amer: >90 mL/min (ref >90)
GFR calc non Af Amer: >90 mL/min (ref >90)
Glucose, Bld: 89 mg/dL (ref 70–99)

## 2012-07-11 LAB — CBC WITH DIFFERENTIAL/PLATELET
Basophils Relative: 0 % (ref 0–1)
Hemoglobin: 14 g/dL (ref 12.0–15.0)
Lymphs Abs: 2.1 10*3/uL (ref 0.7–4.0)
MCHC: 33.7 g/dL (ref 30.0–36.0)
Monocytes Relative: 9 % (ref 3–12)
Neutro Abs: 2.6 10*3/uL (ref 1.7–7.7)
Neutrophils Relative %: 49 % (ref 43–77)
RBC: 4.77 MIL/uL (ref 3.87–5.11)

## 2012-07-11 LAB — URINE MICROSCOPIC-ADD ON

## 2012-07-11 MED ORDER — ACETAMINOPHEN 325 MG PO TABS
975.0000 mg | ORAL_TABLET | Freq: Once | ORAL | Status: AC
  Administered 2012-07-11: 975 mg via ORAL
  Filled 2012-07-11: qty 3

## 2012-07-11 NOTE — ED Notes (Signed)
Has been wearing a body suit to help her loose weight for the past month. C.o abdominal pain.

## 2012-07-11 NOTE — ED Provider Notes (Signed)
History     CSN: [id number]  Arrival date & time 07/11/12  1252   First MD Initiated Contact with Patient 07/11/12 1321      Chief Complaint  Patient presents with  . Abdominal Pain    (Consider location/radiation/quality/duration/timing/severity/associated sxs/prior treatment) HPI This young female presents with abdominal pain.  Pain began seemingly yesterday, though there has been a mild associated complaints for the past several weeks.  Since yesterday she has had pain focally in her left lower corner.  The pain is sore, nonradiating, worse with palpation.  There is no associated nausea, vomiting, diarrhea, vaginal pain or discharge. Note the patient has been wearing a garment designed for weight loss over the past month.  This is a mechanical device which closes tightly over the midsection.  Past Medical History  Diagnosis Date  . No pertinent past medical history     Past Surgical History  Procedure Date  . Pediatric dental surgery   . Induced abortion     No family history on file.  History  Substance Use Topics  . Smoking status: Current Every Day Smoker -- 0.2 packs/day  . Smokeless tobacco: Never Used  . Alcohol Use: 2.4 oz/week    4 Glasses of wine per week    OB History    Grav Para Term Preterm Abortions TAB SAB Ect Mult Living   4 1 1  0 1 0 1 0 0 1      Review of Systems  Constitutional:       Per HPI, otherwise negative  HENT:       Per HPI, otherwise negative  Eyes: Negative.   Respiratory:       Per HPI, otherwise negative  Cardiovascular:       Per HPI, otherwise negative  Gastrointestinal: Negative for vomiting.  Genitourinary: Negative.   Musculoskeletal:       Per HPI, otherwise negative  Skin: Negative.   Neurological: Negative for syncope.    Allergies  Review of patient's allergies indicates no known allergies.  Home Medications   Current Outpatient Rx  Name  Route  Sig  Dispense  Refill  . IBUPROFEN 200 MG PO TABS   Oral   Take 400 mg by mouth every 6 (six) hours as needed. Takes for headache            BP 123/77  Pulse 60  Temp 98.3 F (36.8 C) (Oral)  Resp 18  Ht 5\' 11"  (1.803 m)  Wt 215 lb (97.523 kg)  BMI 29.99 kg/m2  SpO2 100%  LMP 06/16/2012  Breastfeeding? Unknown  Physical Exam  Nursing note and vitals reviewed. Constitutional: She is oriented to person, place, and time. She appears well-developed and well-nourished. No distress.  HENT:  Head: Normocephalic and atraumatic.  Eyes: Conjunctivae normal and EOM are normal.  Cardiovascular: Normal rate and regular rhythm.   Pulmonary/Chest: Effort normal and breath sounds normal. No stridor. No respiratory distress.  Abdominal: She exhibits no distension.    Musculoskeletal: She exhibits no edema.  Neurological: She is alert and oriented to person, place, and time. No cranial nerve deficit.  Skin: Skin is warm and dry.  Psychiatric: She has a normal mood and affect.    ED Course  Procedures (including critical care time)  Labs Reviewed  URINALYSIS, ROUTINE W REFLEX MICROSCOPIC - Abnormal; Notable for the following:    APPearance CLOUDY (*)     Leukocytes, UA SMALL (*)     All other components within normal  limits  URINE MICROSCOPIC-ADD ON - Abnormal; Notable for the following:    Squamous Epithelial / LPF MANY (*)     Bacteria, UA FEW (*)     All other components within normal limits  PREGNANCY, URINE  COMPREHENSIVE METABOLIC PANEL  CBC WITH DIFFERENTIAL  URINE CULTURE   US Transvaginal Non-ob  07/11/2012  *RADIOLOGY REPORT*  Clinical Data: Left lower quadrant abdominal pain.  TRANSABDOMINAL AND TRANSVAGINAL ULTRASOUND OF PELVIS Technique:  Both transabdominal and transvaginal ultrasound examinations of the pelvis were performed. Transabdominal technique was performed for global imaging of the pelvis including uterus, ovaries, adnexal regions, and pelvic cul-de-sac.  It was necessary to proceed with endovaginal exam following the  transabdominal exam to visualize the uterus, endometrium, and ovaries.  Comparison:  06/03/2011  Findings:  Uterus: Anteverted uterus measures 9.9 x 4.6 x 5.4 cm, with unremarkable appearance the myometrium.  Endometrium: Measures 12 mm in thickness, without focal prominence.  Right ovary:  Measures 2.6 x 1.9 x 2.3 cm and appears normal.  Left ovary: Measures 2.4 x 1.7 x 1.7 cm and appears normal.  Other findings: Trace free pelvic fluid noted in the cul-de-sac.  IMPRESSION:  1.  Trace free pelvic fluid in the cul-de-sac, nonspecific and possibly physiologic. 2.  Unremarkable sonographic appearance uterus and ovaries.   Original Report Authenticated By: Gaylyn Rong, M.D.    US Pelvis Complete  07/11/2012  *RADIOLOGY REPORT*  Clinical Data: Left lower quadrant abdominal pain.  TRANSABDOMINAL AND TRANSVAGINAL ULTRASOUND OF PELVIS Technique:  Both transabdominal and transvaginal ultrasound examinations of the pelvis were performed. Transabdominal technique was performed for global imaging of the pelvis including uterus, ovaries, adnexal regions, and pelvic cul-de-sac.  It was necessary to proceed with endovaginal exam following the transabdominal exam to visualize the uterus, endometrium, and ovaries.  Comparison:  06/03/2011  Findings:  Uterus: Anteverted uterus measures 9.9 x 4.6 x 5.4 cm, with unremarkable appearance the myometrium.  Endometrium: Measures 12 mm in thickness, without focal prominence.  Right ovary:  Measures 2.6 x 1.9 x 2.3 cm and appears normal.  Left ovary: Measures 2.4 x 1.7 x 1.7 cm and appears normal.  Other findings: Trace free pelvic fluid noted in the cul-de-sac.  IMPRESSION:  1.  Trace free pelvic fluid in the cul-de-sac, nonspecific and possibly physiologic. 2.  Unremarkable sonographic appearance uterus and ovaries.   Original Report Authenticated By: Gaylyn Rong, M.D.      No diagnosis found.    MDM  This young female presents with new abdominal pain in her left  lower quadrant.  Absent discharge, bleeding, vaginal pain, GYN exam not indicated.  The patient's evaluation, including ultrasound was largely reassuring.  Vital signs are stable.  She is discharged in stable condition to follow up with her primary care physician.  She suffered one here.    Gerhard Munch, MD 07/11/12 [phone]

## 2012-07-12 LAB — URINE CULTURE

## 2012-07-15 ENCOUNTER — Emergency Department (INDEPENDENT_AMBULATORY_CARE_PROVIDER_SITE_OTHER)
Admission: EM | Admit: 2012-07-15 | Discharge: 2012-07-15 | Disposition: A | Discharge status: Home / Self Care | Payer: Self-pay | Source: Home / Self Care | Attending: Family Medicine | Admitting: Family Medicine

## 2012-07-15 ENCOUNTER — Encounter (HOSPITAL_COMMUNITY): Payer: Self-pay | Admitting: Emergency Medicine

## 2012-07-15 DIAGNOSIS — J111 Influenza due to unidentified influenza virus with other respiratory manifestations: Secondary | ICD-10-CM

## 2012-07-15 LAB — POCT I-STAT, CHEM 8
BUN: 6 mg/dL (ref 6–23)
Chloride: 108 mEq/L (ref 96–112)
Creatinine, Ser: 0.7 mg/dL (ref 0.50–1.10)
Sodium: 139 mEq/L (ref 135–145)

## 2012-07-15 LAB — POCT RAPID STREP A: Streptococcus, Group A Screen (Direct): NEGATIVE

## 2012-07-15 MED ORDER — ONDANSETRON HCL 4 MG PO TABS: 4.0000 mg | ORAL_TABLET | Freq: Four times a day (QID) | ORAL | Status: DC

## 2012-07-15 MED ORDER — ONDANSETRON HCL 4 MG/2ML IJ SOLN
4.0000 mg | Freq: Once | INTRAMUSCULAR | Status: AC
  Administered 2012-07-15: 4 mg via INTRAVENOUS

## 2012-07-15 MED ORDER — ONDANSETRON HCL 4 MG/2ML IJ SOLN
INTRAMUSCULAR | Status: AC
  Filled 2012-07-15: qty 2

## 2012-07-15 MED ORDER — SODIUM CHLORIDE 0.9 % IV SOLN
Freq: Once | INTRAVENOUS | Status: AC
  Administered 2012-07-15: 12:00:00 via INTRAVENOUS

## 2012-07-15 NOTE — ED Notes (Signed)
Pt is here for cold/flu like sx since Tuesday Sx include: sore throat, nasal/chest congestion, chills, vomiting this am, diarrhea last night around 2100, cough Has been taking dayquil and mucinex for the discomfort  She is alert w/no signs of acute distress.

## 2012-07-15 NOTE — ED Provider Notes (Signed)
History     CSN: [id number]  Arrival date & time 07/15/12  1018   First MD Initiated Contact with Patient 07/15/12 1034      Chief Complaint  Patient presents with  . URI    (Consider location/radiation/quality/duration/timing/severity/associated sxs/prior treatment) Patient is a 27 y.o. female presenting with URI. The history is provided by the patient.  URI The primary symptoms include fever, fatigue, sore throat, abdominal pain, nausea, vomiting and myalgias. Primary symptoms do not include cough. The problem has been gradually worsening.  Symptoms associated with the illness include congestion and rhinorrhea.    Past Medical History  Diagnosis Date  . No pertinent past medical history     Past Surgical History  Procedure Date  . Pediatric dental surgery   . Induced abortion     No family history on file.  History  Substance Use Topics  . Smoking status: Current Every Day Smoker -- 0.2 packs/day  . Smokeless tobacco: Never Used  . Alcohol Use: 2.4 oz/week    4 Glasses of wine per week    OB History    Grav Para Term Preterm Abortions TAB SAB Ect Mult Living   4 1 1  0 1 0 1 0 0 1      Review of Systems  Constitutional: Positive for fever and fatigue.  HENT: Positive for congestion, sore throat and rhinorrhea.   Respiratory: Negative for cough.   Gastrointestinal: Positive for nausea, vomiting, abdominal pain and diarrhea. Negative for constipation.  Musculoskeletal: Positive for myalgias.    Allergies  Review of patient's allergies indicates no known allergies.  Home Medications   Current Outpatient Rx  Name  Route  Sig  Dispense  Refill  . IBUPROFEN 200 MG PO TABS   Oral   Take 400 mg by mouth every 6 (six) hours as needed. Takes for headache          . ONDANSETRON HCL 4 MG PO TABS   Oral   Take 1 tablet (4 mg total) by mouth every 6 (six) hours.   8 tablet   0     BP 145/99  Pulse 71  Temp 98.9 F (37.2 C) (Oral)  Resp 18  SpO2 100%   LMP 07/15/2012  Breastfeeding? No  Physical Exam  Nursing note and vitals reviewed. Constitutional: She is oriented to person, place, and time. She appears well-developed and well-nourished.  HENT:  Head: Normocephalic.  Right Ear: External ear normal.  Left Ear: External ear normal.  Mouth/Throat: Oropharynx is clear and moist.  Eyes: Conjunctivae normal are normal. Pupils are equal, round, and reactive to light.  Neck: Normal range of motion. Neck supple.  Cardiovascular: Normal rate, regular rhythm, normal heart sounds and intact distal pulses.   Pulmonary/Chest: Effort normal and breath sounds normal.  Abdominal: Soft. Bowel sounds are normal. She exhibits no distension and no mass. There is generalized tenderness. There is no rigidity, no rebound and no guarding.  Lymphadenopathy:    She has no cervical adenopathy.  Neurological: She is alert and oriented to person, place, and time.  Skin: Skin is warm and dry.    ED Course  Procedures (including critical care time)   Labs Reviewed  POCT RAPID STREP A ([facility] CARE ONLY)  POCT I-STAT, CHEM 8   No results found.   1. Influenza-like illness       MDM  Sx improved with ivf and meds.        Linna Hoff, MD 07/15/12  1254 

## 2012-10-10 ENCOUNTER — Emergency Department (INDEPENDENT_AMBULATORY_CARE_PROVIDER_SITE_OTHER)
Admission: EM | Admit: 2012-10-10 | Discharge: 2012-10-10 | Disposition: A | Discharge status: Home / Self Care | Payer: Self-pay | Source: Home / Self Care | Attending: Emergency Medicine | Admitting: Emergency Medicine

## 2012-10-10 ENCOUNTER — Encounter (HOSPITAL_COMMUNITY): Payer: Self-pay

## 2012-10-10 DIAGNOSIS — L02415 Cutaneous abscess of right lower limb: Secondary | ICD-10-CM

## 2012-10-10 DIAGNOSIS — L02419 Cutaneous abscess of limb, unspecified: Secondary | ICD-10-CM

## 2012-10-10 LAB — POCT PREGNANCY, URINE: Preg Test, Ur: NEGATIVE

## 2012-10-10 NOTE — ED Notes (Signed)
Boil on right thigh

## 2012-10-10 NOTE — ED Provider Notes (Signed)
History     CSN: [id number]  Arrival date & time 10/10/12  1659   First MD Initiated Contact with Patient 10/10/12 1835      No chief complaint on file.   (Consider location/radiation/quality/duration/timing/severity/associated sxs/prior treatment) Patient is a 27 y.o. female presenting with abscess. The history is provided by the patient.  Abscess Location:  Leg Leg abscess location:  R upper leg Abscess quality: fluctuance and painful   Red streaking: no   Duration:  4 days Progression:  Worsening Pain details:    Quality:  Sharp   Severity:  Moderate Chronicity:  New Relieved by:  None tried Worsened by:  Nothing tried Ineffective treatments:  None tried Risk factors: no prior abscess     Past Medical History  Diagnosis Date  . No pertinent past medical history     Past Surgical History  Procedure Laterality Date  . Pediatric dental surgery    . Induced abortion      No family history on file.  History  Substance Use Topics  . Smoking status: Current Every Day Smoker -- 0.25 packs/day  . Smokeless tobacco: Never Used  . Alcohol Use: 2.4 oz/week    4 Glasses of wine per week    OB History   Grav Para Term Preterm Abortions TAB SAB Ect Mult Living   4 1 1  0 1 0 1 0 0 1      Review of Systems  Constitutional: Negative.   Skin: Positive for rash.    Allergies  Review of patient's allergies indicates no known allergies.  Home Medications   Current Outpatient Rx  Name  Route  Sig  Dispense  Refill  . ibuprofen (ADVIL,MOTRIN) 200 MG tablet   Oral   Take 400 mg by mouth every 6 (six) hours as needed. Takes for headache          . ondansetron (ZOFRAN) 4 MG tablet   Oral   Take 1 tablet (4 mg total) by mouth every 6 (six) hours.   8 tablet   0     BP 116/76  Pulse 123  Temp(Src) 98.2 F (36.8 C) (Oral)  Resp 20  SpO2 98%  Physical Exam  Nursing note and vitals reviewed. Constitutional: She is oriented to person, place, and time. She  appears well-developed and well-nourished.  Neurological: She is alert and oriented to person, place, and time.  Skin: Skin is warm and dry.  1.5 cm fluctuant round abscess to right thigh     ED Course  INCISION AND DRAINAGE Date/Time: 10/10/2012 7:03 PM Performed by: Linna Hoff Authorized by: Bradd Canary D Consent: Verbal consent obtained. Risks and benefits: risks, benefits and alternatives were discussed Consent given by: patient Type: abscess Body area: lower extremity Location details: right leg Local anesthetic: topical anesthetic Patient sedated: no Scalpel size: 11 Incision type: single straight Complexity: simple Drainage: purulent Drainage amount: moderate Wound treatment: wound left open Packing material: none Patient tolerance: Patient tolerated the procedure well with no immediate complications.   (including critical care time)  Labs Reviewed  CULTURE, ROUTINE-ABSCESS  POCT PREGNANCY, URINE   No results found.   1. Abscess of right thigh       MDM          Linna Hoff, MD 10/10/12 [phone]

## 2012-10-12 LAB — POCT URINALYSIS DIP (DEVICE)
Leukocytes, UA: NEGATIVE
Nitrite: NEGATIVE
Protein, ur: 30 mg/dL — AB
Urobilinogen, UA: 0.2 mg/dL (ref 0.0–1.0)
pH: 5.5 (ref 5.0–8.0)

## 2012-10-14 LAB — CULTURE, ROUTINE-ABSCESS

## 2012-11-23 ENCOUNTER — Encounter (HOSPITAL_BASED_OUTPATIENT_CLINIC_OR_DEPARTMENT_OTHER): Payer: Self-pay

## 2012-11-23 ENCOUNTER — Emergency Department (HOSPITAL_BASED_OUTPATIENT_CLINIC_OR_DEPARTMENT_OTHER)
Admission: EM | Admit: 2012-11-23 | Discharge: 2012-11-23 | Disposition: A | Discharge status: Home / Self Care | Payer: Self-pay | Attending: Emergency Medicine | Admitting: Emergency Medicine

## 2012-11-23 DIAGNOSIS — Z3202 Encounter for pregnancy test, result negative: Secondary | ICD-10-CM | POA: Insufficient documentation

## 2012-11-23 DIAGNOSIS — F17200 Nicotine dependence, unspecified, uncomplicated: Secondary | ICD-10-CM | POA: Insufficient documentation

## 2012-11-23 DIAGNOSIS — N898 Other specified noninflammatory disorders of vagina: Secondary | ICD-10-CM | POA: Insufficient documentation

## 2012-11-23 DIAGNOSIS — L509 Urticaria, unspecified: Secondary | ICD-10-CM | POA: Insufficient documentation

## 2012-11-23 DIAGNOSIS — B9689 Other specified bacterial agents as the cause of diseases classified elsewhere: Secondary | ICD-10-CM

## 2012-11-23 DIAGNOSIS — N760 Acute vaginitis: Secondary | ICD-10-CM | POA: Insufficient documentation

## 2012-11-23 LAB — URINALYSIS, ROUTINE W REFLEX MICROSCOPIC
Bilirubin Urine: NEGATIVE
Hgb urine dipstick: NEGATIVE
Ketones, ur: NEGATIVE mg/dL
Protein, ur: NEGATIVE mg/dL
Specific Gravity, Urine: 1.016 (ref 1.005–1.030)
Urobilinogen, UA: 0.2 mg/dL (ref 0.0–1.0)

## 2012-11-23 LAB — WET PREP, GENITAL: Trich, Wet Prep: NONE SEEN

## 2012-11-23 LAB — PREGNANCY, URINE: Preg Test, Ur: NEGATIVE

## 2012-11-23 MED ORDER — EPINEPHRINE 0.3 MG/0.3ML IJ SOAJ
0.3000 mg | Freq: Once | INTRAMUSCULAR | Status: AC
  Administered 2012-11-23: 0.3 mg via SUBCUTANEOUS
  Filled 2012-11-23: qty 0.3

## 2012-11-23 MED ORDER — DIPHENHYDRAMINE HCL 25 MG PO CAPS
25.0000 mg | ORAL_CAPSULE | Freq: Once | ORAL | Status: AC
  Administered 2012-11-23: 25 mg via ORAL
  Filled 2012-11-23: qty 1

## 2012-11-23 MED ORDER — PREDNISOLONE 5 MG PO TABS
40.0000 mg | ORAL_TABLET | Freq: Once | ORAL | Status: AC
  Filled 2012-11-23: qty 8

## 2012-11-23 MED ORDER — METRONIDAZOLE 500 MG PO TABS: 500.0000 mg | ORAL_TABLET | Freq: Two times a day (BID) | ORAL | Status: DC

## 2012-11-23 MED ORDER — PREDNISONE 20 MG PO TABS
ORAL_TABLET | ORAL | Status: AC
  Administered 2012-11-23: 40 mg
  Filled 2012-11-23: qty 2

## 2012-11-23 MED ORDER — PREDNISONE 10 MG PO TABS: 20.0000 mg | ORAL_TABLET | Freq: Two times a day (BID) | ORAL | Status: DC

## 2012-11-23 NOTE — ED Provider Notes (Signed)
History     CSN: [id number]  Arrival date & time 11/23/12  4098   First MD Initiated Contact with Patient 11/23/12 [phone]      Chief Complaint  Patient presents with  . Urticaria  . Vaginal Discharge    (Consider location/radiation/quality/duration/timing/severity/associated sxs/prior treatment) HPI Comments: The patient presents with complaints of hives on the arms, legs, and torso that started this morning.  She denies any difficulty breathing or airway swelling.  There is a good deal of itching.  She also complains of vaginal discharge but no pelvic pain or bleeding.  She is sexually active with one partner.  LMP one month ago.    Patient is a 27 y.o. female presenting with urticaria. The history is provided by the patient.  Urticaria This is a new problem. The current episode started 3 to 5 hours ago. The problem occurs constantly. The problem has been rapidly worsening. Pertinent negatives include no chest pain and no shortness of breath. Nothing aggravates the symptoms. Nothing relieves the symptoms. She has tried nothing for the symptoms. The treatment provided no relief.    Past Medical History  Diagnosis Date  . No pertinent past medical history     Past Surgical History  Procedure Laterality Date  . Pediatric dental surgery    . Induced abortion      No family history on file.  History  Substance Use Topics  . Smoking status: Current Every Day Smoker -- 0.50 packs/day    Types: Cigarettes  . Smokeless tobacco: Never Used  . Alcohol Use: 2.4 oz/week    4 Glasses of wine per week     Comment: occasional    OB History   Grav Para Term Preterm Abortions TAB SAB Ect Mult Living   4 1 1  0 1 0 1 0 0 1      Review of Systems  Respiratory: Negative for shortness of breath.   Cardiovascular: Negative for chest pain.  All other systems reviewed and are negative.    Allergies  Review of patient's allergies indicates no known allergies.  Home Medications    Current Outpatient Rx  Name  Route  Sig  Dispense  Refill  . metroNIDAZOLE (FLAGYL) 500 MG tablet   Oral   Take 1 tablet (500 mg total) by mouth 2 (two) times daily. One po bid x 7 days   14 tablet   0   . predniSONE (DELTASONE) 10 MG tablet   Oral   Take 2 tablets (20 mg total) by mouth 2 (two) times daily.   12 tablet   0     BP 105/82  Pulse 86  Temp(Src) 98.7 F (37.1 C) (Oral)  Resp 16  Ht 5\' 11"  (1.803 m)  Wt 236 lb 3.2 oz (107.14 kg)  BMI 32.96 kg/m2  SpO2 100%  LMP 11/06/2012  Physical Exam  Nursing note and vitals reviewed. Constitutional: She is oriented to person, place, and time. She appears well-developed and well-nourished. No distress.  HENT:  Head: Normocephalic and atraumatic.  Neck: Normal range of motion. Neck supple.  Cardiovascular: Normal rate and regular rhythm.  Exam reveals no gallop and no friction rub.   No murmur heard. Pulmonary/Chest: Effort normal and breath sounds normal. No respiratory distress. She has no wheezes.  Abdominal: Soft. Bowel sounds are normal. She exhibits no distension. There is no tenderness.  Genitourinary: Uterus normal. Vaginal discharge found.  There is a slight whitish vaginal discharge present but no cmt or adnexal masses  or discomfort.  Musculoskeletal: Normal range of motion.  Neurological: She is alert and oriented to person, place, and time.  Skin: Skin is warm and dry. She is not diaphoretic.    ED Course  Procedures (including critical care time)  Labs Reviewed  WET PREP, GENITAL - Abnormal; Notable for the following:    Clue Cells Wet Prep HPF POC FEW (*)    WBC, Wet Prep HPF POC MODERATE (*)    All other components within normal limits  GC/CHLAMYDIA PROBE AMP  PREGNANCY, URINE  URINALYSIS, ROUTINE W REFLEX MICROSCOPIC   No results found.   1. Urticaria   2. BV (bacterial vaginosis)       MDM  The wet prep is suggestive of BV.  Will treat for this.  She was also given meds for urticaria  which did seem to improve.  Will discharge with flagyl, prednisone, and benadryl.  Return prn.        Geoffery Lyons, MD 11/23/12 2127

## 2012-11-23 NOTE — ED Notes (Signed)
Pt reports she developed hives on legs this am after showering and now has generalized hives.  Denies SHOB and tongue appears WNL.  She also reports a vaginal d/c x 1 week.

## 2012-11-23 NOTE — ED Notes (Signed)
MD at bedside. 

## 2012-11-24 LAB — GC/CHLAMYDIA PROBE AMP
CT Probe RNA: NEGATIVE
GC Probe RNA: NEGATIVE

## 2013-05-22 ENCOUNTER — Emergency Department (HOSPITAL_COMMUNITY)
Admission: EM | Admit: 2013-05-22 | Discharge: 2013-05-23 | Disposition: A | Discharge status: Home / Self Care | Payer: Self-pay | Attending: Emergency Medicine | Admitting: Emergency Medicine

## 2013-05-22 ENCOUNTER — Encounter (HOSPITAL_COMMUNITY): Payer: Self-pay | Admitting: Emergency Medicine

## 2013-05-22 DIAGNOSIS — F17200 Nicotine dependence, unspecified, uncomplicated: Secondary | ICD-10-CM | POA: Insufficient documentation

## 2013-05-22 DIAGNOSIS — Z3202 Encounter for pregnancy test, result negative: Secondary | ICD-10-CM | POA: Insufficient documentation

## 2013-05-22 DIAGNOSIS — N898 Other specified noninflammatory disorders of vagina: Secondary | ICD-10-CM | POA: Insufficient documentation

## 2013-05-22 DIAGNOSIS — Z91040 Latex allergy status: Secondary | ICD-10-CM | POA: Insufficient documentation

## 2013-05-22 DIAGNOSIS — Z792 Long term (current) use of antibiotics: Secondary | ICD-10-CM | POA: Insufficient documentation

## 2013-05-22 DIAGNOSIS — R110 Nausea: Secondary | ICD-10-CM | POA: Insufficient documentation

## 2013-05-22 DIAGNOSIS — N390 Urinary tract infection, site not specified: Secondary | ICD-10-CM | POA: Insufficient documentation

## 2013-05-22 DIAGNOSIS — Z79899 Other long term (current) drug therapy: Secondary | ICD-10-CM | POA: Insufficient documentation

## 2013-05-22 LAB — URINE MICROSCOPIC-ADD ON

## 2013-05-22 LAB — URINALYSIS, ROUTINE W REFLEX MICROSCOPIC
Nitrite: NEGATIVE
Protein, ur: 100 mg/dL — AB
Specific Gravity, Urine: 1.026 (ref 1.005–1.030)
Urobilinogen, UA: 0.2 mg/dL (ref 0.0–1.0)
pH: 5.5 (ref 5.0–8.0)

## 2013-05-22 LAB — POCT PREGNANCY, URINE: Preg Test, Ur: NEGATIVE

## 2013-05-22 MED ORDER — ONDANSETRON 8 MG PO TBDP
8.0000 mg | ORAL_TABLET | Freq: Once | ORAL | Status: AC
  Administered 2013-05-22: 8 mg via ORAL
  Filled 2013-05-22: qty 1

## 2013-05-22 NOTE — ED Notes (Signed)
Pt reports she has lower abdominal burning sensation since today, reports burning with urination and bloody stool x1 tonight. Pt reports no previous episodes of abdominal pain like this. Pt reports nausea but denies vomiting or diarrhea. Pt a&o x4, ambulatory to triage, NAD noted.

## 2013-05-23 LAB — CBC WITH DIFFERENTIAL/PLATELET
Basophils Absolute: 0 10*3/uL (ref 0.0–0.1)
Basophils Relative: 0 % (ref 0–1)
Hemoglobin: 12.7 g/dL (ref 12.0–15.0)
Lymphocytes Relative: 27 % (ref 12–46)
MCHC: 33.5 g/dL (ref 30.0–36.0)
Monocytes Relative: 10 % (ref 3–12)
Neutro Abs: 6.2 10*3/uL (ref 1.7–7.7)
Neutrophils Relative %: 62 % (ref 43–77)
WBC: 10.1 10*3/uL (ref 4.0–10.5)

## 2013-05-23 LAB — WET PREP, GENITAL
Trich, Wet Prep: NONE SEEN
Yeast Wet Prep HPF POC: NONE SEEN

## 2013-05-23 LAB — COMPREHENSIVE METABOLIC PANEL
ALT: 17 U/L (ref 0–35)
AST: 18 U/L (ref 0–37)
Albumin: 3.4 g/dL — ABNORMAL LOW (ref 3.5–5.2)
Alkaline Phosphatase: 49 U/L (ref 39–117)
Calcium: 9.1 mg/dL (ref 8.4–10.5)
GFR calc Af Amer: >90 mL/min (ref >90)
Potassium: 3.8 mEq/L (ref 3.5–5.1)
Sodium: 135 mEq/L (ref 135–145)
Total Protein: 6.8 g/dL (ref 6.0–8.3)

## 2013-05-23 LAB — GC/CHLAMYDIA PROBE AMP
CT Probe RNA: NEGATIVE
GC Probe RNA: NEGATIVE

## 2013-05-23 MED ORDER — CIPROFLOXACIN HCL 500 MG PO TABS: 500.0000 mg | ORAL_TABLET | Freq: Two times a day (BID) | ORAL | Status: DC

## 2013-05-23 MED ORDER — LIDOCAINE HCL 1 % IJ SOLN
INTRAMUSCULAR | Status: AC
  Administered 2013-05-23: 20 mL
  Filled 2013-05-23: qty 20

## 2013-05-23 MED ORDER — KETOROLAC TROMETHAMINE 30 MG/ML IJ SOLN
30.0000 mg | Freq: Once | INTRAMUSCULAR | Status: AC
  Administered 2013-05-23: 30 mg via INTRAVENOUS
  Filled 2013-05-23: qty 1

## 2013-05-23 MED ORDER — SODIUM CHLORIDE 0.9 % IV BOLUS (SEPSIS): 1000.0000 mL | INTRAVENOUS | Status: DC

## 2013-05-23 MED ORDER — KETOROLAC TROMETHAMINE 60 MG/2ML IM SOLN: 60.0000 mg | Freq: Once | INTRAMUSCULAR | Status: DC

## 2013-05-23 MED ORDER — CEFTRIAXONE SODIUM 1 G IJ SOLR
1.0000 g | Freq: Once | INTRAMUSCULAR | Status: AC
  Administered 2013-05-23: 1 g via INTRAMUSCULAR
  Filled 2013-05-23: qty 10

## 2013-05-23 MED ORDER — PHENAZOPYRIDINE HCL 200 MG PO TABS: 200.0000 mg | ORAL_TABLET | Freq: Three times a day (TID) | ORAL | Status: DC

## 2013-05-23 NOTE — ED Provider Notes (Signed)
Medical screening examination/treatment/procedure(s) were performed by non-physician practitioner and as supervising physician I was immediately available for consultation/collaboration.    Candiace West, MD 05/23/13 0625 

## 2013-05-23 NOTE — ED Provider Notes (Signed)
CSN: [id number]     Arrival date & time 05/22/13  2122 History   First MD Initiated Contact with Patient 05/22/13 2340     Chief Complaint  Patient presents with  . Abdominal Pain   (Consider location/radiation/quality/duration/timing/severity/associated sxs/prior Treatment) HPI Pt is a 27yo female c/o 1 day hx of lower abdominal pain and cramping with burning sensation, associated with dysuria and hematuria. Pt reports when she peed earlier today the tissue paper was light pink but throughout the day it became darker in color.  Also c/o increased frequency and urgency. Reports nausea but no vomiting.  Lower abdominal pain is 8/10. Also c/o lower back pain but states she thought it was due to lifting weights earlier.  Pt is sexually active, not on birth control. Last had intercourse 4 days ago. Pt states she is not concerned for STDs. Reports vaginal odor but no pain or discharge. Denies fever, vomiting or diarrhea. Denies hx of UTI. No pain medication PTA.  Past Medical History  Diagnosis Date  . No pertinent past medical history    Past Surgical History  Procedure Laterality Date  . Pediatric dental surgery    . Induced abortion     History reviewed. No pertinent family history. History  Substance Use Topics  . Smoking status: Current Every Day Smoker -- 0.50 packs/day    Types: Cigarettes  . Smokeless tobacco: Never Used  . Alcohol Use: 2.4 oz/week    4 Glasses of wine per week     Comment: occasional   OB History   Grav Para Term Preterm Abortions TAB SAB Ect Mult Living   4 1 1  0 1 0 1 0 0 1     Review of Systems  Constitutional: Negative for fever and chills.  Gastrointestinal: Positive for nausea and abdominal pain ( lower abdomen). Negative for vomiting and diarrhea.  Genitourinary: Positive for dysuria, urgency, frequency, hematuria, vaginal discharge ( "odor") and pelvic pain. Negative for flank pain, decreased urine volume, vaginal bleeding, vaginal pain and  menstrual problem.  Musculoskeletal: Positive for back pain ( lower back).  All other systems reviewed and are negative.    Allergies  Latex  Home Medications   Current Outpatient Rx  Name  Route  Sig  Dispense  Refill  . ciprofloxacin (CIPRO) 500 MG tablet   Oral   Take 1 tablet (500 mg total) by mouth 2 (two) times daily.   6 tablet   0   . phenazopyridine (PYRIDIUM) 200 MG tablet   Oral   Take 1 tablet (200 mg total) by mouth 3 (three) times daily.   6 tablet   0    BP 128/75  Pulse 81  Temp(Src) 98.1 F (36.7 C) (Oral)  Resp 18  Ht 5\' 11"  (1.803 m)  Wt 230 lb (104.327 kg)  BMI 32.09 kg/m2  SpO2 98%  LMP 05/10/2013 Physical Exam  Nursing note and vitals reviewed. Constitutional: She appears well-developed and well-nourished. No distress.  HENT:  Head: Normocephalic and atraumatic.  Eyes: Conjunctivae are normal. No scleral icterus.  Neck: Normal range of motion.  Cardiovascular: Normal rate, regular rhythm and normal heart sounds.   Pulmonary/Chest: Effort normal and breath sounds normal. No respiratory distress. She has no wheezes. She has no rales. She exhibits no tenderness.  Abdominal: Soft. Bowel sounds are normal. She exhibits no distension and no mass. There is tenderness ( lower abdomen). There is no rebound, no guarding and no CVA tenderness.  Genitourinary: Uterus normal. No labial  fusion. There is no rash, tenderness, lesion or injury on the right labia. There is no rash, tenderness, lesion or injury on the left labia. Cervix exhibits no motion tenderness, no discharge and no friability. Right adnexum displays no mass, no tenderness and no fullness. Left adnexum displays no mass, no tenderness and no fullness. No erythema, tenderness or bleeding around the vagina. No foreign body around the vagina. No signs of injury around the vagina. No vaginal discharge found.  No vaginal discharge, no CMT, adnexal tenderness or masses  Musculoskeletal: Normal range of  motion.  Neurological: She is alert.  Skin: Skin is warm and dry. She is not diaphoretic.    ED Course  Procedures (including critical care time) Labs Review Labs Reviewed  WET PREP, GENITAL - Abnormal; Notable for the following:    Clue Cells Wet Prep HPF POC FEW (*)    WBC, Wet Prep HPF POC FEW (*)    All other components within normal limits  COMPREHENSIVE METABOLIC PANEL - Abnormal; Notable for the following:    Glucose, Bld 110 (*)    Albumin 3.4 (*)    Total Bilirubin 0.2 (*)    All other components within normal limits  URINALYSIS, ROUTINE W REFLEX MICROSCOPIC - Abnormal; Notable for the following:    Color, Urine RED (*)    APPearance TURBID (*)    Hgb urine dipstick LARGE (*)    Bilirubin Urine SMALL (*)    Protein, ur 100 (*)    Leukocytes, UA LARGE (*)    All other components within normal limits  URINE MICROSCOPIC-ADD ON - Abnormal; Notable for the following:    Bacteria, UA MANY (*)    All other components within normal limits  URINE CULTURE  GC/CHLAMYDIA PROBE AMP  CBC WITH DIFFERENTIAL  LIPASE, BLOOD  POCT PREGNANCY, URINE   Imaging Review No results found.  EKG Interpretation   None       MDM   1. UTI (urinary tract infection)    Pt with signs and symptoms consistent with UTI.  Pelvic exam: unremarkable.   Urine preg: negative UA: significant for UTI.    Will give 1st dose antibiotics here: 1g Rocephin.  Rx: Cipro. All labs/imaging/findings discussed with patient. All questions answered and concerns addressed. Will discharge pt home and have pt f/u with [facility] and [facility] info provided. Return precautions given. Pt verbalized understanding and agreement with tx plan. Vitals: unremarkable. Discharged in stable condition.    Discussed pt with attending during ED encounter and agrees with plan.     Junius Finner, PA-C 05/23/13 [phone]

## 2013-05-24 LAB — URINE CULTURE: Colony Count: >=100000

## 2013-07-12 ENCOUNTER — Encounter (HOSPITAL_COMMUNITY): Payer: Self-pay | Admitting: Emergency Medicine

## 2013-07-12 ENCOUNTER — Emergency Department (HOSPITAL_COMMUNITY)
Admission: EM | Admit: 2013-07-12 | Discharge: 2013-07-12 | Disposition: A | Discharge status: Home / Self Care | Payer: Self-pay | Attending: Emergency Medicine | Admitting: Emergency Medicine

## 2013-07-12 DIAGNOSIS — Z91040 Latex allergy status: Secondary | ICD-10-CM | POA: Insufficient documentation

## 2013-07-12 DIAGNOSIS — Z3202 Encounter for pregnancy test, result negative: Secondary | ICD-10-CM | POA: Insufficient documentation

## 2013-07-12 DIAGNOSIS — Z79899 Other long term (current) drug therapy: Secondary | ICD-10-CM | POA: Insufficient documentation

## 2013-07-12 DIAGNOSIS — N390 Urinary tract infection, site not specified: Secondary | ICD-10-CM | POA: Insufficient documentation

## 2013-07-12 DIAGNOSIS — F17200 Nicotine dependence, unspecified, uncomplicated: Secondary | ICD-10-CM | POA: Insufficient documentation

## 2013-07-12 LAB — URINE MICROSCOPIC-ADD ON

## 2013-07-12 LAB — URINALYSIS, ROUTINE W REFLEX MICROSCOPIC
Bilirubin Urine: NEGATIVE
GLUCOSE, UA: NEGATIVE mg/dL
Hgb urine dipstick: NEGATIVE
Ketones, ur: NEGATIVE mg/dL
Nitrite: NEGATIVE
PROTEIN: NEGATIVE mg/dL
Specific Gravity, Urine: 1.015 (ref 1.005–1.030)
UROBILINOGEN UA: 0.2 mg/dL (ref 0.0–1.0)
pH: 5.5 (ref 5.0–8.0)

## 2013-07-12 LAB — GLUCOSE, CAPILLARY: Glucose-Capillary: 100 mg/dL — ABNORMAL HIGH (ref 70–99)

## 2013-07-12 LAB — POCT PREGNANCY, URINE: Preg Test, Ur: NEGATIVE

## 2013-07-12 MED ORDER — SULFAMETHOXAZOLE-TRIMETHOPRIM 800-160 MG PO TABS: 1.0000 | ORAL_TABLET | Freq: Two times a day (BID) | ORAL | Status: DC

## 2013-07-12 MED ORDER — PHENAZOPYRIDINE HCL 200 MG PO TABS: 200.0000 mg | ORAL_TABLET | Freq: Three times a day (TID) | ORAL | Status: DC

## 2013-07-12 NOTE — ED Provider Notes (Signed)
CSN: [id number]     Arrival date & time 07/12/13  1354 History   First MD Initiated Contact with Patient 07/12/13 1406 This chart was scribed for non-physician practitioner Wylene Simmer, PA-C working with Richardean Canal, MD by Valera Castle, ED scribe. This patient was seen in room WTR5/WTR5 and the patient's care was started at 2:36 PM.     Chief Complaint  Patient presents with  . Urinary Tract Infection   (Consider location/radiation/quality/duration/timing/severity/associated sxs/prior Treatment) The history is provided by the patient. No language interpreter was used.   HPI Comments: Erin Boyd is a 28 y.o. female who presents to the Emergency Department complaining of gradually worsening dysuria, stating it burns to urinate, with associated worsening urinary frequency, onset yesterday. She reports h/o first UTI 2 months ago. She reports she drinks lots of water every day. She reports associated lower, left back pain. She denies fever, chills, hematuria, vaginal bleeding, vaginal discharge, and any other associated symptoms.   PCP - No PCP Per Patient  Past Medical History  Diagnosis Date  . No pertinent past medical history    Past Surgical History  Procedure Laterality Date  . Pediatric dental surgery    . Induced abortion     No family history on file. History  Substance Use Topics  . Smoking status: Current Every Day Smoker -- 0.50 packs/day    Types: Cigarettes  . Smokeless tobacco: Never Used  . Alcohol Use: 2.4 oz/week    4 Glasses of wine per week     Comment: occasional   OB History   Grav Para Term Preterm Abortions TAB SAB Ect Mult Living   4 1 1  0 1 0 1 0 0 1     Review of Systems  All other systems reviewed and are negative.   Allergies  Latex  Home Medications   Current Outpatient Rx  Name  Route  Sig  Dispense  Refill  . phenazopyridine (PYRIDIUM) 200 MG tablet   Oral   Take 1 tablet (200 mg total) by mouth 3 (three) times daily.  6 tablet   0   . tetrahydrozoline (VISINE) 0.05 % ophthalmic solution   Both Eyes   Place 2 drops into both eyes daily.          BP 126/74  Pulse 99  Temp(Src) 98.2 F (36.8 C) (Oral)  Resp 16  SpO2 100%  LMP 07/09/2013  Physical Exam  Nursing note and vitals reviewed. Constitutional: She is oriented to person, place, and time. She appears well-developed and well-nourished. No distress.  HENT:  Head: Normocephalic and atraumatic.  Mouth/Throat: Oropharynx is clear and moist.  Eyes: Conjunctivae are normal. No scleral icterus.  Pulmonary/Chest: Effort normal.  Abdominal:  No cva tenderness  Musculoskeletal: Normal range of motion. She exhibits no edema and no tenderness.  Neurological: She is alert and oriented to person, place, and time. She exhibits normal muscle tone. Coordination normal.  Skin: Skin is warm and dry. No rash noted. No erythema. No pallor.  Psychiatric: She has a normal mood and affect. Her behavior is normal. Judgment and thought content normal.    ED Course  Procedures (including critical care time)  DIAGNOSTIC STUDIES: Oxygen Saturation is 100% on room air, normal by my interpretation.    COORDINATION OF CARE: 2:40 PM-Discussed treatment plan which includes glucose screening with pt at bedside and pt agreed to plan.   Results for orders placed during the hospital encounter of 07/12/13  URINALYSIS, ROUTINE W  REFLEX MICROSCOPIC      Result Value Range   Color, Urine YELLOW  YELLOW   APPearance CLOUDY (*) CLEAR   Specific Gravity, Urine 1.015  1.005 - 1.030   pH 5.5  5.0 - 8.0   Glucose, UA NEGATIVE  NEGATIVE mg/dL   Hgb urine dipstick NEGATIVE  NEGATIVE   Bilirubin Urine NEGATIVE  NEGATIVE   Ketones, ur NEGATIVE  NEGATIVE mg/dL   Protein, ur NEGATIVE  NEGATIVE mg/dL   Urobilinogen, UA 0.2  0.0 - 1.0 mg/dL   Nitrite NEGATIVE  NEGATIVE   Leukocytes, UA SMALL (*) NEGATIVE  URINE MICROSCOPIC-ADD ON      Result Value Range   Squamous  Epithelial / LPF FEW (*) RARE   WBC, UA 7-10  <3 WBC/hpf   RBC / HPF 0-2  <3 RBC/hpf   Bacteria, UA RARE  RARE   Urine-Other MUCOUS PRESENT    POCT PREGNANCY, URINE      Result Value Range   Preg Test, Ur NEGATIVE  NEGATIVE   No results found.   Medications - No data to display  MDM  UTI  Patient here with symptoms c/w UTI - CBG 100, urine culture sent - no clinical suspicion for pyelonephritis - no SIRS criteria.  I personally performed the services described in this documentation, which was scribed in my presence. The recorded information has been reviewed and is accurate.   Izola PriceFrances C. Marisue HumbleSanford, New JerseyPA-C 07/12/13 [phone]

## 2013-07-12 NOTE — Discharge Instructions (Signed)
Antibiotic Medication °Antibiotics are among the most frequently prescribed medicines. Antibiotics cure illness by assisting our body to injure or kill the bacteria that cause infection. While antibiotics are useful to treat a wide variety of infections they are useless against viruses. Antibiotics cannot cure colds, flu, or other viral infections.  °There are many types of antibiotics available. Your caregiver will decide which antibiotic will be useful for an illness. Never take or give someone else's antibiotics or left over medicine. °Your caregiver may also take into account: °· Allergies. °· The cost of the medicine. °· Dosing schedules. °· Taste. °· Common side effects when choosing an antibiotic for an infection. °Ask your caregiver if you have questions about why a certain medicine was chosen. °HOME CARE INSTRUCTIONS °Read all instructions and labels on medicine bottles carefully. Some antibiotics should be taken on an empty stomach while others should be taken with food. Taking antibiotics incorrectly may reduce how well they work. Some antibiotics need to be kept in the refrigerator. Others should be kept at room temperature. Ask your caregiver or pharmacist if you do not understand how to give the medicine. °Be sure to give the amount of medicine your caregiver has prescribed. Even if you feel better and your symptoms improve, bacteria may still remain alive in the body. Taking all of the medicine will prevent: °· The infection from returning and becoming harder to treat. °· Complications from partially treated infections. °If there is any medicine left over after you have taken the medicine as your caregiver has instructed, throw the medicine away. °Be sure to tell your caregiver if you: °· Are allergic to any medicines. °· Are pregnant or intend to become pregnant while using this medicine. °· Are breastfeeding. °· Are taking any other prescription, non-prescription medicine, or herbal  remedies. °· Have any other medical conditions or problems you have not already discussed. °If you are taking birth control pills, they may not work while you are on antibiotics. To avoid unwanted pregnancy: °· Continue taking your birth control pills as usual. °· Use a second form of birth control (such as condoms) while you are taking antibiotic medicine. °· When you finish taking the antibiotic medicine, continue using the second form of birth control until you are finished with your current 1 month cycle of birth control pills. °Try not to miss any doses of medicine. If you miss a dose, take it as soon as possible. However, if it is almost time for the next dose and the dosing schedule is: °· 2 doses a day, take the missed dose and the next dose 5 to 6 hours apart. °· 3 or more doses a day, take the missed dose and the next dose 2 to 4 hours apart, then go back to the normal schedule. °· If you are unable to make up a missed dose, take the next scheduled dose on time and complete the missed dose at the end of the prescribed time for your medicine. °SIDE EFFECTS TO TAKING ANTIBIOTICS °Common side effects to antibiotic use include: °· Soft stools or diarrhea. °· Mild stomach upset. °· Sun sensitivity. °SEEK MEDICAL CARE IF:  °· If you get worse or do not improve within a few days of starting the medicine. °· Vomiting develops. °· Diaper rash or rash on the genitals appears. °· Vaginal itching occurs. °· White patches appear on the tongue or in the mouth. °· Severe watery diarrhea and abdominal cramps occur. °· Signs of an allergy develop (hives, unknown   itchy rash appears). STOP TAKING THE ANTIBIOTIC. °SEEK IMMEDIATE MEDICAL CARE IF:  °· Urine turns dark or blood colored. °· Skin turns yellow. °· Easy bruising or bleeding occurs. °· Joint pain or muscle aches occur. °· Fever returns. °· Severe headache occurs. °· Signs of an allergy develop (trouble breathing, wheezing, swelling of the lips, face or tongue,  fainting, or blisters on the skin or in the mouth). STOP TAKING THE ANTIBIOTIC. °Document Released: 02/05/2004 Document Revised: 08/17/2011 Document Reviewed: 02/14/2009 °ExitCare® Patient Information ©2014 [employer]. ° °Urinary Tract Infection °Urinary tract infections (UTIs) can develop anywhere along your urinary tract. Your urinary tract is your body's drainage system for removing wastes and extra water. Your urinary tract includes two kidneys, two ureters, a bladder, and a urethra. Your kidneys are a pair of bean-shaped organs. Each kidney is about the size of your fist. They are located below your ribs, one on each side of your spine. °CAUSES °Infections are caused by microbes, which are microscopic organisms, including fungi, viruses, and bacteria. These organisms are so small that they can only be seen through a microscope. Bacteria are the microbes that most commonly cause UTIs. °SYMPTOMS  °Symptoms of UTIs may vary by age and gender of the patient and by the location of the infection. Symptoms in young women typically include a frequent and intense urge to urinate and a painful, burning feeling in the bladder or urethra during urination. Older women and men are more likely to be tired, shaky, and weak and have muscle aches and abdominal pain. A fever may mean the infection is in your kidneys. Other symptoms of a kidney infection include pain in your back or sides below the ribs, nausea, and vomiting. °DIAGNOSIS °To diagnose a UTI, your caregiver will ask you about your symptoms. Your caregiver also will ask to provide a urine sample. The urine sample will be tested for bacteria and white blood cells. White blood cells are made by your body to help fight infection. °TREATMENT  °Typically, UTIs can be treated with medication. Because most UTIs are caused by a bacterial infection, they usually can be treated with the use of antibiotics. The choice of antibiotic and length of treatment depend on your  symptoms and the type of bacteria causing your infection. °HOME CARE INSTRUCTIONS °· If you were prescribed antibiotics, take them exactly as your caregiver instructs you. Finish the medication even if you feel better after you have only taken some of the medication. °· Drink enough water and fluids to keep your urine clear or pale yellow. °· Avoid caffeine, tea, and carbonated beverages. They tend to irritate your bladder. °· Empty your bladder often. Avoid holding urine for long periods of time. °· Empty your bladder before and after sexual intercourse. °· After a bowel movement, women should cleanse from front to back. Use each tissue only once. °SEEK MEDICAL CARE IF:  °· You have back pain. °· You develop a fever. °· Your symptoms do not begin to resolve within 3 days. °SEEK IMMEDIATE MEDICAL CARE IF:  °· You have severe back pain or lower abdominal pain. °· You develop chills. °· You have nausea or vomiting. °· You have continued burning or discomfort with urination. °MAKE SURE YOU:  °· Understand these instructions. °· Will watch your condition. °· Will get help right away if you are not doing well or get worse. °Document Released: 03/04/2005 Document Revised: 11/24/2011 Document Reviewed: 07/03/2011 °ExitCare® Patient Information ©2014 [employer]. ° °

## 2013-07-12 NOTE — ED Notes (Signed)
Per pt, states she has UTI in December-started having symptoms yesterday, burning after urination

## 2013-07-12 NOTE — ED Notes (Signed)
CBG 100 

## 2013-07-12 NOTE — Progress Notes (Signed)
[facility] provided pt with a list of primary care resources, [facility] information, and a [facility] [employer] Card application.  °

## 2013-07-13 LAB — URINE CULTURE: Special Requests: NORMAL

## 2013-07-13 NOTE — ED Provider Notes (Signed)
Medical screening examination/treatment/procedure(s) were performed by non-physician practitioner and as supervising physician I was immediately available for consultation/collaboration.  EKG Interpretation   None         David H Yao, MD 07/13/13 0652 

## 2013-08-08 ENCOUNTER — Encounter (HOSPITAL_BASED_OUTPATIENT_CLINIC_OR_DEPARTMENT_OTHER): Payer: Self-pay | Admitting: Emergency Medicine

## 2013-08-08 ENCOUNTER — Emergency Department (HOSPITAL_BASED_OUTPATIENT_CLINIC_OR_DEPARTMENT_OTHER)
Admission: EM | Admit: 2013-08-08 | Discharge: 2013-08-08 | Disposition: A | Discharge status: Home / Self Care | Payer: Self-pay | Attending: Emergency Medicine | Admitting: Emergency Medicine

## 2013-08-08 DIAGNOSIS — J3489 Other specified disorders of nose and nasal sinuses: Secondary | ICD-10-CM | POA: Insufficient documentation

## 2013-08-08 DIAGNOSIS — Z3202 Encounter for pregnancy test, result negative: Secondary | ICD-10-CM | POA: Insufficient documentation

## 2013-08-08 DIAGNOSIS — R109 Unspecified abdominal pain: Secondary | ICD-10-CM | POA: Insufficient documentation

## 2013-08-08 DIAGNOSIS — Z79899 Other long term (current) drug therapy: Secondary | ICD-10-CM | POA: Insufficient documentation

## 2013-08-08 DIAGNOSIS — R6889 Other general symptoms and signs: Secondary | ICD-10-CM | POA: Insufficient documentation

## 2013-08-08 DIAGNOSIS — J029 Acute pharyngitis, unspecified: Secondary | ICD-10-CM | POA: Insufficient documentation

## 2013-08-08 DIAGNOSIS — R197 Diarrhea, unspecified: Secondary | ICD-10-CM | POA: Insufficient documentation

## 2013-08-08 DIAGNOSIS — A5901 Trichomonal vulvovaginitis: Secondary | ICD-10-CM | POA: Insufficient documentation

## 2013-08-08 DIAGNOSIS — R05 Cough: Secondary | ICD-10-CM | POA: Insufficient documentation

## 2013-08-08 DIAGNOSIS — Z91040 Latex allergy status: Secondary | ICD-10-CM | POA: Insufficient documentation

## 2013-08-08 DIAGNOSIS — R059 Cough, unspecified: Secondary | ICD-10-CM | POA: Insufficient documentation

## 2013-08-08 DIAGNOSIS — Z87891 Personal history of nicotine dependence: Secondary | ICD-10-CM | POA: Insufficient documentation

## 2013-08-08 DIAGNOSIS — Z792 Long term (current) use of antibiotics: Secondary | ICD-10-CM | POA: Insufficient documentation

## 2013-08-08 LAB — URINALYSIS, ROUTINE W REFLEX MICROSCOPIC
BILIRUBIN URINE: NEGATIVE
GLUCOSE, UA: NEGATIVE mg/dL
Ketones, ur: NEGATIVE mg/dL
Nitrite: NEGATIVE
PROTEIN: NEGATIVE mg/dL
Specific Gravity, Urine: 1.016 (ref 1.005–1.030)
UROBILINOGEN UA: 0.2 mg/dL (ref 0.0–1.0)
pH: 5.5 (ref 5.0–8.0)

## 2013-08-08 LAB — WET PREP, GENITAL: Yeast Wet Prep HPF POC: NONE SEEN

## 2013-08-08 LAB — PREGNANCY, URINE: Preg Test, Ur: NEGATIVE

## 2013-08-08 LAB — URINE MICROSCOPIC-ADD ON

## 2013-08-08 MED ORDER — AZITHROMYCIN 250 MG PO TABS
1000.0000 mg | ORAL_TABLET | Freq: Once | ORAL | Status: AC
  Administered 2013-08-08: 1000 mg via ORAL
  Filled 2013-08-08: qty 4

## 2013-08-08 MED ORDER — AZITHROMYCIN 1 G PO PACK: 1.0000 g | PACK | Freq: Once | ORAL | Status: DC

## 2013-08-08 MED ORDER — CEFTRIAXONE SODIUM 250 MG IJ SOLR
250.0000 mg | Freq: Once | INTRAMUSCULAR | Status: AC
  Administered 2013-08-08: 250 mg via INTRAMUSCULAR
  Filled 2013-08-08: qty 250

## 2013-08-08 MED ORDER — METRONIDAZOLE 500 MG PO TABS: 2000.0000 mg | ORAL_TABLET | Freq: Three times a day (TID) | ORAL | Status: DC

## 2013-08-08 NOTE — ED Notes (Signed)
MD at bedside. 

## 2013-08-08 NOTE — ED Provider Notes (Signed)
I saw and evaluated the patient, reviewed the resident's note and I agree with the findings and plan.   EKG Interpretation None      Pt with discharge and some mild burnign with urination and intermittent protected sex.  UA positive for trich and pt treated for STI.  Gwyneth SproutWhitney Londin Antone, MD 08/08/13 1535

## 2013-08-08 NOTE — ED Provider Notes (Signed)
CSN: [id number]     Arrival date & time 08/08/13  0907 History   First MD Initiated Contact with Patient 08/08/13 [phone]     Chief Complaint  Patient presents with  . Dysuria   HPI Erin Boyd is a 28 y.o. female presents the ED with dysuria, with red/cloudish color. Patient has had 2 prior UTI in the past 2 months. She has noticed last night and this morning a sharp pain with urination. She has noticed diarrhea that started last night, watery X3. She is with a new partner over the past two months, and has unprotected sex. She is not on BC. She iendorses frequency of urination. She endorses LLQ pain and back pain. She denies fever. No history of kidney stones in pt or family. Patient is able to eat and drink without complication. She denies vomit or nausea. She has not had STI in the past, that she can recall. She endorses increase in vaginal discharge with odd odor.  In addition she has felt congested with sinus pressure for 3 days. No fevers. She endorses a sore throat that started last night, with mild headache, cough and sneeze.  Past Medical History  Diagnosis Date  . No pertinent past medical history    Past Surgical History  Procedure Laterality Date  . Pediatric dental surgery    . Induced abortion     No family history on file. History  Substance Use Topics  . Smoking status: Former Smoker -- 0.50 packs/day    Types: Cigarettes  . Smokeless tobacco: Never Used  . Alcohol Use: 0.0 oz/week     Comment: occasional   OB History   Grav Para Term Preterm Abortions TAB SAB Ect Mult Living   4 1 1  0 1 0 1 0 0 1     Review of Systems  Constitutional: Negative for fever, chills, activity change, appetite change, fatigue and unexpected weight change.  HENT: Positive for congestion, sinus pressure, sneezing and sore throat. Negative for ear pain and rhinorrhea.   Eyes: Negative for pain and redness.  Respiratory: Positive for cough. Negative for shortness of breath and  wheezing.   Cardiovascular: Negative for chest pain.  Gastrointestinal: Positive for abdominal pain and diarrhea. Negative for nausea and vomiting.  Genitourinary: Positive for dysuria, frequency, hematuria, flank pain and vaginal discharge. Negative for genital sores.    Allergies  Latex  Home Medications   Current Outpatient Rx  Name  Route  Sig  Dispense  Refill  . phenazopyridine (PYRIDIUM) 200 MG tablet   Oral   Take 1 tablet (200 mg total) by mouth 3 (three) times daily.   6 tablet   0   . phenazopyridine (PYRIDIUM) 200 MG tablet   Oral   Take 1 tablet (200 mg total) by mouth 3 (three) times daily.   6 tablet   0   . sulfamethoxazole-trimethoprim (SEPTRA DS) 800-160 MG per tablet   Oral   Take 1 tablet by mouth every 12 (twelve) hours.   10 tablet   0   . tetrahydrozoline (VISINE) 0.05 % ophthalmic solution   Both Eyes   Place 2 drops into both eyes daily.          BP 126/76  Pulse 82  Temp(Src) 97.6 F (36.4 C) (Oral)  Resp 18  Ht 5\' 10"  (1.778 m)  Wt 230 lb (104.327 kg)  BMI 33.00 kg/m2  SpO2 100%  LMP 07/09/2013 Physical Exam Gen: Afebrile. NAD.  HEENT: AT. . Bilateral  TM visualized and normal in appearance. Bilateral eyes without injections or icterus. MMM. Bilateral nares with erythema and swelling of the left turbinate. Throat with mild erythema, no exudates. Moderate facial pressure over left maxillary and frontal sinus.  CV: RRR  Chest: CTAB, no wheeze or crackles Abd: Soft. Mild suprapubic tenderness. Mild LLQ tenderness NTND. BS present. No masses.   Neuro: PERLA. EOMi. Alert. Grossly intact.   GYN:  External genitalia within normal limits.  Vaginal mucosa pink, moist, normal rugae.  Nonfriable cervix without lesions, moderate thin red tinged frothy discharge, no discharge, No bleeding on speculum.   Bimanual exam revealed normal, nongravid uterus.  Positive  cervical motion tenderness. No adnexal masses bilaterally.     ED Course   Procedures (including critical care time) Labs Review Labs Reviewed  WET PREP, GENITAL - Abnormal; Notable for the following:    Trich, Wet Prep FEW (*)    Clue Cells Wet Prep HPF POC MODERATE (*)    WBC, Wet Prep HPF POC MODERATE (*)    All other components within normal limits  URINALYSIS, ROUTINE W REFLEX MICROSCOPIC - Abnormal; Notable for the following:    Hgb urine dipstick TRACE (*)    Leukocytes, UA SMALL (*)    All other components within normal limits  URINE MICROSCOPIC-ADD ON - Abnormal; Notable for the following:    Bacteria, UA FEW (*)    All other components within normal limits  GC/CHLAMYDIA PROBE AMP  PREGNANCY, URINE   Imaging Review No results found.   EKG Interpretation None      MDM   Final diagnoses:  None   Patient with positive trichmonas and positive BV per wet prep. Negative pregnancy test and urine without concern for UTI. Gonorrhea and chlamydia cultures were obtained today. Patient was treated with azith 1 g and ceftriaxone IM. She was sent with a prescription for flagyl for BV and trich.  Sinus infection: likely viral, she was advised to use nasal spray. Patient was encouraged to find a PCP and follow up for test of cure. She was encourage to refrain from sexual activity for 7 days, and inform her partner of need of treatment.      Natalia Leatherwood, DO 08/08/13 1124

## 2013-08-08 NOTE — ED Notes (Signed)
Pt reports dysuria and possible blood in urine, flank pain, cough and congestion, sore throat.

## 2013-08-08 NOTE — Discharge Instructions (Signed)
Trichomoniasis Trichomoniasis is an infection, caused by the Trichomonas organism, that affects both women and men. In women, the outer female genitalia and the vagina are affected. In men, the penis is mainly affected, but the prostate and other reproductive organs can also be involved. Trichomoniasis is a sexually transmitted disease (STD) and is most often passed to another person through sexual contact. The majority of people who get trichomoniasis do so from a sexual encounter and are also at risk for other STDs. CAUSES   Sexual intercourse with an infected partner.  It can be present in swimming pools or hot tubs. SYMPTOMS   Abnormal gray-green frothy vaginal discharge in women.  Vaginal itching and irritation in women.  Itching and irritation of the area outside the vagina in women.  Penile discharge with or without pain in males.  Inflammation of the urethra (urethritis), causing painful urination.  Bleeding after sexual intercourse. RELATED COMPLICATIONS  Pelvic inflammatory disease.  Infection of the uterus (endometritis).  Infertility.  Tubal (ectopic) pregnancy.  It can be associated with other STDs, including gonorrhea and chlamydia, hepatitis B, and HIV. COMPLICATIONS DURING PREGNANCY  Early (premature) delivery.  Premature rupture of the membranes (PROM).  Low birth weight. DIAGNOSIS   Visualization of Trichomonas under the microscope from the vagina discharge.  Ph of the vagina greater than 4.5, tested with a test tape.  Trich Rapid Test.  Culture of the organism, but this is not usually needed.  It may be found on a Pap test.  Having a "strawberry cervix,"which means the cervix looks very red like a strawberry. TREATMENT   You may be given medication to fight the infection. Inform your caregiver if you could be or are pregnant. Some medications used to treat the infection should not be taken during pregnancy.  Over-the-counter medications or  creams to decrease itching or irritation may be recommended.  Your sexual partner will need to be treated if infected. HOME CARE INSTRUCTIONS   Take all medication prescribed by your caregiver.  Take over-the-counter medication for itching or irritation as directed by your caregiver.  Do not have sexual intercourse while you have the infection.  Do not douche or wear tampons.  Discuss your infection with your partner, as your partner may have acquired the infection from you. Or, your partner may have been the person who transmitted the infection to you.  Have your sex partner examined and treated if necessary.  Practice safe, informed, and protected sex.  See your caregiver for other STD testing. SEEK MEDICAL CARE IF:   You still have symptoms after you finish the medication.  You have an oral temperature above 102 F (38.9 C).  You develop belly (abdominal) pain.  You have pain when you urinate.  You have bleeding after sexual intercourse.  You develop a rash.  The medication makes you sick or makes you throw up (vomit). Document Released: 11/18/2000 Document Revised: 08/17/2011 Document Reviewed: 12/14/2008 [facility] Patient Information 2014 [place], [place].  Safe Sex Safe sex is about reducing the risk of giving or getting a sexually transmitted disease (STD). STDs are spread through sexual contact involving the genitals, mouth, or rectum. Some STDS can be cured and others cannot. Safe sex can also prevent unintended pregnancies.  SAFE SEX PRACTICES  Limit your sexual activity to only one partner who is only having sex with you.  Talk to your partner about their past partners, past STDs, and drug use.  Use a condom every time you have sexual intercourse. This  vaginal, oral, and anal sexual activity. Both females and males should wear condoms during oral sex. Only use latex or polyurethane condoms and water-based lubricants. Petroleum-based lubricants or oils  used to lubricate a condom will weaken the condom and increase the chance that it will break. The condom should be in place from the beginning to the end of sexual activity. Wearing a condom reduces, but does not completely eliminate, your risk of getting or giving a STD. STDs can be spread by contact with skin of surrounding areas. °· Get vaccinated for hepatitis B and HPV. °· Avoid alcohol and recreational drugs which can affect your judgement. You may forget to use a condom or participate in high-risk sex. °· For females, avoid douching after sexual intercourse. Douching can spread an infection farther into the reproductive tract. °· Check your body for signs of sores, blisters, rashes, or unusual discharge. See your caregiver if you notice any of these signs. °· Avoid sexual contact if you have symptoms of an infection or are being treated for an STD. If you or your partner has herpes, avoid sexual contact when blisters are present. Use condoms at all other times. °· See your caregiver for regular screenings, examinations, and tests for STDs. Before having sex with a new partner, each of you should be screened for STDs and talk about the results with your partner. °BENEFITS OF SAFE SEX  °· There is less of a chance of getting or giving an STD. °· You can prevent unwanted or unintended pregnancies. °· By discussing safer sex concerns with your partner, you may increase feelings of intimacy, comfort, trust, and honesty between the both of you. °Document Released: 07/02/2004 Document Revised: 02/17/2012 Document Reviewed: 11/16/2011 °ExitCare® Patient Information ©2014 [employer]. ° °

## 2013-08-08 NOTE — ED Notes (Signed)
Pelvic cart to bedside 

## 2013-08-09 LAB — GC/CHLAMYDIA PROBE AMP
CT Probe RNA: NEGATIVE
GC Probe RNA: NEGATIVE

## 2013-08-09 NOTE — ED Notes (Signed)
Pt presented to ED requesting a work note and to be held out for 3 days. Pt was given note to return to work today since no work note initiated by EDP at time of visit.

## 2013-12-11 ENCOUNTER — Encounter (HOSPITAL_COMMUNITY): Payer: Self-pay

## 2013-12-11 ENCOUNTER — Other Ambulatory Visit: Payer: Self-pay

## 2013-12-11 ENCOUNTER — Observation Stay (HOSPITAL_COMMUNITY)
Admission: EM | Admit: 2013-12-11 | Discharge: 2013-12-12 | Disposition: A | Discharge status: Home / Self Care | Payer: Self-pay | Attending: Internal Medicine | Admitting: Internal Medicine

## 2013-12-11 ENCOUNTER — Emergency Department (HOSPITAL_COMMUNITY): Payer: Self-pay

## 2013-12-11 DIAGNOSIS — A498 Other bacterial infections of unspecified site: Secondary | ICD-10-CM | POA: Insufficient documentation

## 2013-12-11 DIAGNOSIS — F1994 Other psychoactive substance use, unspecified with psychoactive substance-induced mood disorder: Secondary | ICD-10-CM | POA: Insufficient documentation

## 2013-12-11 DIAGNOSIS — E872 Acidosis, unspecified: Secondary | ICD-10-CM | POA: Insufficient documentation

## 2013-12-11 DIAGNOSIS — I456 Pre-excitation syndrome: Secondary | ICD-10-CM | POA: Insufficient documentation

## 2013-12-11 DIAGNOSIS — N3000 Acute cystitis without hematuria: Secondary | ICD-10-CM

## 2013-12-11 DIAGNOSIS — T50901A Poisoning by unspecified drugs, medicaments and biological substances, accidental (unintentional), initial encounter: Secondary | ICD-10-CM

## 2013-12-11 DIAGNOSIS — T40904A Poisoning by unspecified psychodysleptics [hallucinogens], undetermined, initial encounter: Secondary | ICD-10-CM | POA: Insufficient documentation

## 2013-12-11 DIAGNOSIS — T43624A Poisoning by amphetamines, undetermined, initial encounter: Principal | ICD-10-CM | POA: Insufficient documentation

## 2013-12-11 DIAGNOSIS — T43601A Poisoning by unspecified psychostimulants, accidental (unintentional), initial encounter: Secondary | ICD-10-CM | POA: Insufficient documentation

## 2013-12-11 DIAGNOSIS — F4323 Adjustment disorder with mixed anxiety and depressed mood: Secondary | ICD-10-CM

## 2013-12-11 DIAGNOSIS — Y33XXXS Other specified events, undetermined intent, sequela: Secondary | ICD-10-CM

## 2013-12-11 DIAGNOSIS — T50901S Poisoning by unspecified drugs, medicaments and biological substances, accidental (unintentional), sequela: Secondary | ICD-10-CM

## 2013-12-11 DIAGNOSIS — E860 Dehydration: Secondary | ICD-10-CM | POA: Insufficient documentation

## 2013-12-11 DIAGNOSIS — Z87891 Personal history of nicotine dependence: Secondary | ICD-10-CM | POA: Insufficient documentation

## 2013-12-11 DIAGNOSIS — T50904S Poisoning by unspecified drugs, medicaments and biological substances, undetermined, sequela: Secondary | ICD-10-CM

## 2013-12-11 DIAGNOSIS — F1910 Other psychoactive substance abuse, uncomplicated: Secondary | ICD-10-CM

## 2013-12-11 DIAGNOSIS — N390 Urinary tract infection, site not specified: Secondary | ICD-10-CM | POA: Insufficient documentation

## 2013-12-11 LAB — SALICYLATE LEVEL: Salicylate Lvl: <2 mg/dL — ABNORMAL LOW (ref 2.8–20.0)

## 2013-12-11 LAB — RAPID URINE DRUG SCREEN, HOSP PERFORMED
AMPHETAMINES: POSITIVE — AB
BENZODIAZEPINES: NOT DETECTED
Barbiturates: NOT DETECTED
Cocaine: NOT DETECTED
Opiates: NOT DETECTED
Tetrahydrocannabinol: POSITIVE — AB

## 2013-12-11 LAB — URINE MICROSCOPIC-ADD ON

## 2013-12-11 LAB — COMPREHENSIVE METABOLIC PANEL
ALT: 26 U/L (ref 0–35)
ANION GAP: 26 — AB (ref 5–15)
AST: 41 U/L — ABNORMAL HIGH (ref 0–37)
Albumin: 4.1 g/dL (ref 3.5–5.2)
Alkaline Phosphatase: 55 U/L (ref 39–117)
BUN: 8 mg/dL (ref 6–23)
CALCIUM: 10.1 mg/dL (ref 8.4–10.5)
CO2: 14 meq/L — AB (ref 19–32)
CREATININE: 0.77 mg/dL (ref 0.50–1.10)
Chloride: 97 mEq/L (ref 96–112)
Glucose, Bld: 112 mg/dL — ABNORMAL HIGH (ref 70–99)
Potassium: 3.8 mEq/L (ref 3.7–5.3)
Sodium: 137 mEq/L (ref 137–147)
TOTAL PROTEIN: 8.3 g/dL (ref 6.0–8.3)
Total Bilirubin: 0.3 mg/dL (ref 0.3–1.2)

## 2013-12-11 LAB — CBC WITH DIFFERENTIAL/PLATELET
Basophils Absolute: 0 10*3/uL (ref 0.0–0.1)
Basophils Relative: 0 % (ref 0–1)
EOS ABS: 0 10*3/uL (ref 0.0–0.7)
EOS PCT: 0 % (ref 0–5)
HEMATOCRIT: 41.7 % (ref 36.0–46.0)
Hemoglobin: 13.9 g/dL (ref 12.0–15.0)
LYMPHS ABS: 1.7 10*3/uL (ref 0.7–4.0)
Lymphocytes Relative: 21 % (ref 12–46)
MCH: 29.3 pg (ref 26.0–34.0)
MCHC: 33.3 g/dL (ref 30.0–36.0)
MCV: 87.8 fL (ref 78.0–100.0)
MONO ABS: 0.7 10*3/uL (ref 0.1–1.0)
Monocytes Relative: 8 % (ref 3–12)
Neutro Abs: 5.6 10*3/uL (ref 1.7–7.7)
Neutrophils Relative %: 71 % (ref 43–77)
Platelets: 270 10*3/uL (ref 150–400)
RBC: 4.75 MIL/uL (ref 3.87–5.11)
RDW: 13.7 % (ref 11.5–15.5)
WBC: 8 10*3/uL (ref 4.0–10.5)

## 2013-12-11 LAB — URINALYSIS, ROUTINE W REFLEX MICROSCOPIC
Bilirubin Urine: NEGATIVE
Glucose, UA: NEGATIVE mg/dL
NITRITE: POSITIVE — AB
Protein, ur: NEGATIVE mg/dL
Specific Gravity, Urine: 1.015 (ref 1.005–1.030)
Urobilinogen, UA: 0.2 mg/dL (ref 0.0–1.0)
pH: 5 (ref 5.0–8.0)

## 2013-12-11 LAB — ACETAMINOPHEN LEVEL: Acetaminophen (Tylenol), Serum: <15 ug/mL (ref 10–30)

## 2013-12-11 LAB — ETHANOL: ALCOHOL ETHYL (B): 26 mg/dL — AB (ref 0–11)

## 2013-12-11 LAB — MRSA PCR SCREENING: MRSA BY PCR: NEGATIVE

## 2013-12-11 LAB — POC URINE PREG, ED: Preg Test, Ur: NEGATIVE

## 2013-12-11 LAB — I-STAT CG4 LACTIC ACID, ED: LACTIC ACID, VENOUS: 4.06 mmol/L — AB (ref 0.5–2.2)

## 2013-12-11 MED ORDER — SODIUM CHLORIDE 0.9 % IV BOLUS (SEPSIS)
1000.0000 mL | Freq: Once | INTRAVENOUS | Status: AC
  Administered 2013-12-11: 1000 mL via INTRAVENOUS

## 2013-12-11 MED ORDER — SODIUM CHLORIDE 0.9 % IV SOLN
INTRAVENOUS | Status: DC
  Administered 2013-12-11: 07:00:00 via INTRAVENOUS

## 2013-12-11 MED ORDER — CIPROFLOXACIN HCL 500 MG PO TABS
500.0000 mg | ORAL_TABLET | Freq: Two times a day (BID) | ORAL | Status: DC
  Administered 2013-12-11 – 2013-12-12 (×2): 500 mg via ORAL
  Filled 2013-12-11 (×5): qty 1

## 2013-12-11 MED ORDER — CHLORHEXIDINE GLUCONATE 0.12 % MT SOLN
15.0000 mL | Freq: Two times a day (BID) | OROMUCOSAL | Status: DC
Start: 2013-12-11 — End: 2013-12-12
  Administered 2013-12-11 – 2013-12-12 (×2): 15 mL via OROMUCOSAL
  Filled 2013-12-11 (×4): qty 15

## 2013-12-11 MED ORDER — SODIUM CHLORIDE 0.9 % IV SOLN
INTRAVENOUS | Status: DC
  Administered 2013-12-11: 05:00:00 via INTRAVENOUS

## 2013-12-11 MED ORDER — ONDANSETRON HCL 4 MG PO TABS: 4.0000 mg | ORAL_TABLET | Freq: Four times a day (QID) | ORAL | Status: DC | PRN

## 2013-12-11 MED ORDER — ENOXAPARIN SODIUM 40 MG/0.4ML ~~LOC~~ SOLN
40.0000 mg | SUBCUTANEOUS | Status: DC
  Administered 2013-12-11: 40 mg via SUBCUTANEOUS
  Filled 2013-12-11 (×2): qty 0.4

## 2013-12-11 MED ORDER — SENNOSIDES-DOCUSATE SODIUM 8.6-50 MG PO TABS: 1.0000 | ORAL_TABLET | Freq: Every evening | ORAL | Status: DC | PRN

## 2013-12-11 MED ORDER — THIAMINE HCL 100 MG/ML IJ SOLN
Freq: Once | INTRAVENOUS | Status: AC
  Administered 2013-12-11: 10:00:00 via INTRAVENOUS
  Filled 2013-12-11: qty 1000

## 2013-12-11 MED ORDER — ACETAMINOPHEN 325 MG PO TABS: 650.0000 mg | ORAL_TABLET | Freq: Four times a day (QID) | ORAL | Status: DC | PRN

## 2013-12-11 MED ORDER — BIOTENE DRY MOUTH MT LIQD
15.0000 mL | Freq: Two times a day (BID) | OROMUCOSAL | Status: DC
  Administered 2013-12-11 – 2013-12-12 (×3): 15 mL via OROMUCOSAL

## 2013-12-11 MED ORDER — ONDANSETRON HCL 4 MG/2ML IJ SOLN: 4.0000 mg | Freq: Four times a day (QID) | INTRAMUSCULAR | Status: DC | PRN

## 2013-12-11 MED ORDER — ACETAMINOPHEN 650 MG RE SUPP: 650.0000 mg | Freq: Four times a day (QID) | RECTAL | Status: DC | PRN

## 2013-12-11 MED ORDER — CIPROFLOXACIN HCL 500 MG PO TABS: 500.0000 mg | ORAL_TABLET | Freq: Two times a day (BID) | ORAL | Status: DC

## 2013-12-11 NOTE — H&P (Signed)
[facility]          History and Physical    PCP:   No PCP Per Patient   Chief Complaint:  Drug and alcohol overdose  HPI: Patient is a 28 year old young lady without significant past medical history. Apparently she was found in a hotel room after a "friend" called EMS because she was foaming at the mouth and was incoherent. The friend says that they were using some type of drug but comes in a crystal form that they called "smiley", which we are assuming is ecstasy. Upon arrival she was nonresponsive and hypoxemic, however her O2 saturations have responded and she is now on room air, she can understand questions that are asked to her and can nod and shake her head, however is unable or unwilling to talk. She also has her hands clenched in a tight fist and is unable to move her legs. Emergency department has asked the hospitalist team to admit her for observation. She adamantly denies that this was done as a suicide attempt.  Allergies:   Allergies  Allergen Reactions  . Latex Rash      Past Medical History  Diagnosis Date  . No pertinent past medical history     Past Surgical History  Procedure Laterality Date  . Pediatric dental surgery    . Induced abortion      Prior to Admission medications   Medication Sig Start Date End Date Taking? Authorizing Provider  tetrahydrozoline (VISINE) 0.05 % ophthalmic solution Place 2 drops into both eyes 2 (two) times daily as needed (dry eyes).     Historical Provider, MD    Social History:  reports that she has quit smoking. Her smoking use included Cigarettes. She smoked 0.50 packs per day. She has never used smokeless tobacco. She reports that she drinks alcohol. She reports that she does not use illicit drugs.  No family history on file.  Review of Systems:  Unable to obtain currently given inability to speak.  Physical Exam: Blood pressure 133/77, pulse 90, temperature 100 F (37.8 C), temperature  source Axillary, resp. rate 12, height 5' 10"  (1.778 m), weight 103.9 kg (229 lb 0.9 oz), SpO2 100.00%. General: Awake, can follow commands, currently nonverbal. HEENT: Normocephalic, atraumatic, pupils equal round and reactive to light, extraocular movements are intact. Neck: Supple, no JVD, no lymphadenopathy, no bruits, no goiter. Cardiovascular: Regular rate and rhythm, no murmurs, rubs or gallops. Lungs: Clear to auscultation bilaterally.  Abdomen: Soft, nontender, nondistended, positive bowel sounds, no masses organomegaly noted. Extremities: No clubbing, cyanosis or edema, positive pedal pulses. 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  Labs on Admission:  Results for orders placed during the hospital encounter of 12/11/13 (from the past 48 hour(s))  URINE RAPID DRUG SCREEN (HOSP PERFORMED)     Status: Abnormal   Collection Time    12/11/13  5:09 AM      Result Value Ref Range   Opiates NONE DETECTED  NONE DETECTED   Cocaine NONE DETECTED  NONE DETECTED   Benzodiazepines NONE DETECTED  NONE DETECTED   Amphetamines POSITIVE (*) NONE DETECTED   Tetrahydrocannabinol POSITIVE (*) NONE DETECTED   Barbiturates NONE DETECTED  NONE DETECTED   Comment:            DRUG SCREEN FOR MEDICAL PURPOSES  ONLY.  IF CONFIRMATION IS NEEDED     FOR ANY PURPOSE, NOTIFY LAB     WITHIN 5 DAYS.                LOWEST DETECTABLE LIMITS     FOR URINE DRUG SCREEN     Drug Class       Cutoff (ng/mL)     Amphetamine      1000     Barbiturate      200     Benzodiazepine   161     Tricyclics       096     Opiates          300     Cocaine          300     THC              50  POC URINE PREG, ED     Status: None   Collection Time    12/11/13  5:13 AM      Result Value Ref Range   Preg Test, Ur NEGATIVE  NEGATIVE   Comment:            THE SENSITIVITY OF THIS     METHODOLOGY IS >24 mIU/mL  ETHANOL      Status: Abnormal   Collection Time    12/11/13  5:15 AM      Result Value Ref Range   Alcohol, Ethyl (B) 26 (*) 0 - 11 mg/dL   Comment:            LOWEST DETECTABLE LIMIT FOR     SERUM ALCOHOL IS 11 mg/dL     FOR MEDICAL PURPOSES ONLY  SALICYLATE LEVEL     Status: Abnormal   Collection Time    12/11/13  5:15 AM      Result Value Ref Range   Salicylate Lvl <0.4 (*) 2.8 - 20.0 mg/dL  ACETAMINOPHEN LEVEL     Status: None   Collection Time    12/11/13  5:15 AM      Result Value Ref Range   Acetaminophen (Tylenol), Serum <15.0  10 - 30 ug/mL   Comment:            THERAPEUTIC CONCENTRATIONS VARY     SIGNIFICANTLY. A RANGE OF 10-30     ug/mL MAY BE AN EFFECTIVE     CONCENTRATION FOR MANY PATIENTS.     HOWEVER, SOME ARE BEST TREATED     AT CONCENTRATIONS OUTSIDE THIS     RANGE.     ACETAMINOPHEN CONCENTRATIONS     >150 ug/mL AT 4 HOURS AFTER     INGESTION AND >50 ug/mL AT 12     HOURS AFTER INGESTION ARE     OFTEN ASSOCIATED WITH TOXIC     REACTIONS.  CBC WITH DIFFERENTIAL     Status: None   Collection Time    12/11/13  5:15 AM      Result Value Ref Range   WBC 8.0  4.0 - 10.5 K/uL   RBC 4.75  3.87 - 5.11 MIL/uL   Hemoglobin 13.9  12.0 - 15.0 g/dL   HCT 41.7  36.0 - 46.0 %   MCV 87.8  78.0 - 100.0 fL   MCH 29.3  26.0 - 34.0 pg   MCHC 33.3  30.0 - 36.0 g/dL   RDW 13.7  11.5 - 15.5 %   Platelets 270  150 - 400 K/uL   Neutrophils Relative %  71  43 - 77 %   Neutro Abs 5.6  1.7 - 7.7 K/uL   Lymphocytes Relative 21  12 - 46 %   Lymphs Abs 1.7  0.7 - 4.0 K/uL   Monocytes Relative 8  3 - 12 %   Monocytes Absolute 0.7  0.1 - 1.0 K/uL   Eosinophils Relative 0  0 - 5 %   Eosinophils Absolute 0.0  0.0 - 0.7 K/uL   Basophils Relative 0  0 - 1 %   Basophils Absolute 0.0  0.0 - 0.1 K/uL  COMPREHENSIVE METABOLIC PANEL     Status: Abnormal   Collection Time    12/11/13  5:15 AM      Result Value Ref Range   Sodium 137  137 - 147 mEq/L   Potassium 3.8  3.7 - 5.3 mEq/L    Comment: SLIGHT HEMOLYSIS     HEMOLYSIS AT THIS LEVEL MAY AFFECT RESULT   Chloride 97  96 - 112 mEq/L   CO2 14 (*) 19 - 32 mEq/L   Glucose, Bld 112 (*) 70 - 99 mg/dL   BUN 8  6 - 23 mg/dL   Creatinine, Ser 0.77  0.50 - 1.10 mg/dL   Calcium 10.1  8.4 - 10.5 mg/dL   Total Protein 8.3  6.0 - 8.3 g/dL   Albumin 4.1  3.5 - 5.2 g/dL   AST 41 (*) 0 - 37 U/L   Comment: SLIGHT HEMOLYSIS     HEMOLYSIS AT THIS LEVEL MAY AFFECT RESULT   ALT 26  0 - 35 U/L   Comment: SLIGHT HEMOLYSIS     HEMOLYSIS AT THIS LEVEL MAY AFFECT RESULT   Alkaline Phosphatase 55  39 - 117 U/L   Total Bilirubin 0.3  0.3 - 1.2 mg/dL   GFR calc non Af Amer >90  >90 mL/min   GFR calc Af Amer >90  >90 mL/min   Comment: (NOTE)     The eGFR has been calculated using the CKD EPI equation.     This calculation has not been validated in all clinical situations.     eGFR's persistently <90 mL/min signify possible Chronic Kidney     Disease.   Anion gap 26 (*) 5 - 15  I-STAT CG4 LACTIC ACID, ED     Status: Abnormal   Collection Time    12/11/13  6:53 AM      Result Value Ref Range   Lactic Acid, Venous 4.06 (*) 0.5 - 2.2 mmol/L    Radiological Exams on Admission: Ct Head Wo Contrast  12/11/2013   CLINICAL DATA:  Altered mental status.  EXAM: CT HEAD WITHOUT CONTRAST  TECHNIQUE: Contiguous axial images were obtained from the base of the skull through the vertex without intravenous contrast.  COMPARISON:  None.  FINDINGS: The ventricles and sulci are normal. No intraparenchymal hemorrhage, mass effect nor midline shift. No acute large vascular territory infarcts.  No abnormal extra-axial fluid collections. Basal cisterns are patent.  No skull fracture. The included ocular globes and orbital contents are non-suspicious. The mastoid aircells and included paranasal sinuses are well-aerated.  IMPRESSION: No acute intracranial process ; normal noncontrast CT of the head.   Electronically Signed   By: Elon Alas   On: 12/11/2013  06:14    Assessment/Plan Principal Problem:   Drug overdose Active Problems:   Metabolic acidosis    Drug overdose -When asked if this was done as a suicide attempt she adamantly shakes her head no. -The friend  that was with her states that they were using ecstasy and alcohol. -Her UDS confirms the presence of amphetamines, marijuana, her alcohol level is 26. -Vital signs including oxygenation are currently stable. -Will admit to the hospital for observation and psychiatric evaluation.  Metabolic acidosis -This is related to lactic acidosis likely from poor tissue perfusion from dehydration and drug use.  DVT prophylaxis -Lovenox  CODE STATUS -Full code   Time Spent on Admission: 70 minutes  Erin Boyd,Erin Boyd [facility] Pager: [phone] 12/11/2013, 9:28 AM

## 2013-12-11 NOTE — ED Provider Notes (Signed)
CSN: [id number]     Arrival date & time 12/11/13  0457 History   First MD Initiated Contact with Patient 12/11/13 [phone]     Chief Complaint  Patient presents with  . Ingestion     (Consider location/radiation/quality/duration/timing/severity/associated sxs/prior Treatment) Patient is a 28 y.o. female presenting with Ingested Medication. The history is provided by the EMS personnel. The history is limited by the condition of the patient.  Ingestion   patient was found to help to room these decreased level of consciousness. According to EMS, she had use alcohol this evening as well has used ecstasy. Her CBC was 121. She became more arousable and agitated when EMS tried to talk to her. IV established and patient transported here. No further history obtainable  Past Medical History  Diagnosis Date  . No pertinent past medical history    Past Surgical History  Procedure Laterality Date  . Pediatric dental surgery    . Induced abortion     No family history on file. History  Substance Use Topics  . Smoking status: Former Smoker -- 0.50 packs/day    Types: Cigarettes  . Smokeless tobacco: Never Used  . Alcohol Use: 0.0 oz/week     Comment: occasional   OB History   Grav Para Term Preterm Abortions TAB SAB Ect Mult Living   4 1 1  0 1 0 1 0 0 1     Review of Systems  Unable to perform ROS     Allergies  Latex  Home Medications   Prior to Admission medications   Medication Sig Start Date End Date Taking? Authorizing Provider  metroNIDAZOLE (FLAGYL) 500 MG tablet Take 4 tablets (2,000 mg total) by mouth 3 (three) times daily. 08/08/13   Renee A Kuneff, DO  phenazopyridine (PYRIDIUM) 200 MG tablet Take 1 tablet (200 mg total) by mouth 3 (three) times daily. 05/23/13   Junius FinnerErin O'Malley, PA-C  phenazopyridine (PYRIDIUM) 200 MG tablet Take 1 tablet (200 mg total) by mouth 3 (three) times daily. 07/12/13   Izola PriceFrances C. Sanford, PA-C  sulfamethoxazole-trimethoprim (SEPTRA DS) 800-160 MG per  tablet Take 1 tablet by mouth every 12 (twelve) hours. 07/12/13   Izola PriceFrances C. Sanford, PA-C  tetrahydrozoline (VISINE) 0.05 % ophthalmic solution Place 2 drops into both eyes daily.    Historical Provider, MD   BP 154/64  Pulse 116  Temp(Src) 99.1 F (37.3 C) (Oral)  Resp 30  SpO2 100% Physical Exam  Nursing note and vitals reviewed. Constitutional: She is oriented to person, place, and time. She appears well-developed and well-nourished.  Non-toxic appearance. No distress.  HENT:  Head: Normocephalic and atraumatic.  Eyes: Conjunctivae, EOM and lids are normal. Pupils are equal, round, and reactive to light.  Neck: Normal range of motion. Neck supple. No tracheal deviation present. No mass present.  Cardiovascular: Regular rhythm and normal heart sounds.  Tachycardia present.  Exam reveals no gallop.   No murmur heard. Pulmonary/Chest: Effort normal and breath sounds normal. No stridor. No respiratory distress. She has no decreased breath sounds. She has no wheezes. She has no rhonchi. She has no rales.  Abdominal: Soft. Normal appearance and bowel sounds are normal. She exhibits no distension. There is no tenderness. There is no rebound and no CVA tenderness.  Musculoskeletal: Normal range of motion. She exhibits no edema and no tenderness.  Neurological: She is alert and oriented to person, place, and time. GCS eye subscore is 3. GCS verbal subscore is 3. GCS motor subscore is 5.  Withdraws to pain in all 4 extremities  Skin: Skin is warm and dry. No abrasion and no rash noted.    ED Course  Procedures (including critical care time) Labs Review Labs Reviewed  ETHANOL  URINE RAPID DRUG SCREEN (HOSP PERFORMED)  SALICYLATE LEVEL  ACETAMINOPHEN LEVEL  CBC WITH DIFFERENTIAL  COMPREHENSIVE METABOLIC PANEL  POC URINE PREG, ED    Imaging Review No results found.   EKG Interpretation None      MDM   Final diagnoses:  None   Patient reassessed multiple times and is able to  protect her airway. Head CT without acute findings. Patient is arousable to deep stimulation. Suspect that patient's current symptoms her combination of the effects of alcohol in combination with ecstasy. We'll continue to monitor and discharge when at baseline    7:22 AM Patient reassessed and more responsive now. Still would not follow commands. The eyes are open and she is looking at her mother. Given 2 L of saline. Her lactate was elevated. Suspect dehydration. Head CT was negative. Drug screen positive for amphetamines and THC. Will admit to step down for observation for [facility]   CRITICAL CARE Performed by: Toy BakerALLEN,Divante Kotch T Total critical care time: 60 Critical care time was exclusive of separately billable procedures and treating other patients. Critical care was necessary to treat or prevent imminent or life-threatening deterioration. Critical care was time spent personally by me on the following activities: development of treatment plan with patient and/or surrogate as well as nursing, discussions with consultants, evaluation of patient's response to treatment, examination of patient, obtaining history from patient or surrogate, ordering and performing treatments and interventions, ordering and review of laboratory studies, ordering and review of radiographic studies, pulse oximetry and re-evaluation of patient's condition.   Toy BakerAnthony T Keandra Medero, MD 12/11/13 [phone]

## 2013-12-11 NOTE — Progress Notes (Signed)
  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 Dr Clearence PedSchorr notified.

## 2013-12-11 NOTE — ED Notes (Signed)
Spoke with mother and confirmed identity of pt. Mother is on her way to hospital.

## 2013-12-11 NOTE — Significant Event (Addendum)
Called by telemetry to review abnormal rhythm strip and EKG that midlevel obtained.  Patient is asymptomatic.  Rhythm strip demonstrates a WPW pattern.  Patient does not carry a diagnosis of WPW.  Given that patient is asymptomatic at this time, no need for urgent consultation at this point, day team may wish to call cardiology in AM.    On further review, there may be some question if "unresponsive episode" was due to OD vs cardiogenic syncope related to WPW.
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
## 2013-12-11 NOTE — ED Notes (Signed)
Patient transported to CT 

## 2013-12-11 NOTE — ED Notes (Signed)
Family at bedside. 

## 2013-12-11 NOTE — ED Notes (Signed)
Bed: RESA Expected date:  Expected time:  Means of arrival:  Comments: EMS 30F ETOH/ OD

## 2013-12-11 NOTE — Progress Notes (Signed)
Dr Philip AspenHernandez Acosta states patient does not need suicide sitters ,because this was not intentional by the patient.
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
## 2013-12-11 NOTE — ED Notes (Addendum)
Patient's mother and stepfather contacted and are at the bedside and are requesting to have patient XXX with password Ladybug.

## 2013-12-11 NOTE — Progress Notes (Signed)
Clinical Social Work  CSW received a referral to complete psychosocial assessment. CSW went to room but patient on the phone. Mom present and reports patient is trying to feel better and "put together all the pieces" from last night. CSW will follow up at later time to complete psychosocial assessment.  RinglingHolly Ilyanna Baillargeon, Kentucky[place]CS[place] [phone]

## 2013-12-12 ENCOUNTER — Telehealth: Payer: Self-pay | Admitting: Internal Medicine

## 2013-12-12 DIAGNOSIS — N3000 Acute cystitis without hematuria: Secondary | ICD-10-CM

## 2013-12-12 DIAGNOSIS — N390 Urinary tract infection, site not specified: Secondary | ICD-10-CM

## 2013-12-12 LAB — COMPREHENSIVE METABOLIC PANEL
ALK PHOS: 43 U/L (ref 39–117)
ALT: 17 U/L (ref 0–35)
AST: 21 U/L (ref 0–37)
Albumin: 2.9 g/dL — ABNORMAL LOW (ref 3.5–5.2)
Anion gap: 11 (ref 5–15)
BUN: 10 mg/dL (ref 6–23)
CALCIUM: 8.7 mg/dL (ref 8.4–10.5)
CO2: 21 mEq/L (ref 19–32)
Chloride: 107 mEq/L (ref 96–112)
Creatinine, Ser: 0.85 mg/dL (ref 0.50–1.10)
GFR calc Af Amer: >90 mL/min (ref >90)
Glucose, Bld: 95 mg/dL (ref 70–99)
Potassium: 3.8 mEq/L (ref 3.7–5.3)
SODIUM: 139 meq/L (ref 137–147)
Total Bilirubin: 0.4 mg/dL (ref 0.3–1.2)
Total Protein: 5.9 g/dL — ABNORMAL LOW (ref 6.0–8.3)

## 2013-12-12 LAB — CBC
HCT: 39.5 % (ref 36.0–46.0)
HEMOGLOBIN: 12.6 g/dL (ref 12.0–15.0)
MCH: 28.9 pg (ref 26.0–34.0)
MCHC: 31.9 g/dL (ref 30.0–36.0)
MCV: 90.6 fL (ref 78.0–100.0)
PLATELETS: 157 10*3/uL (ref 150–400)
RBC: 4.36 MIL/uL (ref 3.87–5.11)
RDW: 14.2 % (ref 11.5–15.5)
WBC: 8.4 10*3/uL (ref 4.0–10.5)

## 2013-12-12 MED ORDER — CIPROFLOXACIN HCL 500 MG PO TABS: 500.0000 mg | ORAL_TABLET | Freq: Two times a day (BID) | ORAL | Status: DC

## 2013-12-12 NOTE — Telephone Encounter (Signed)
Per Rosann Auerbachrish - Pt needs a 30 day monitor for ?WPW. Dr Johney FrameAllred is ordering, but results to Dr Ladona Ridgelaylor.  Spoke with Trish to let her know pt is self pay and I will need to send her the application for [employer].   Spoke with patient while she is still in the hospital and explain the policy and process for getting the monitor.  Faxed the [employer] application to the Nursing Station  and her nurse will give it to her.    I will defer the order x 2 months until I receive the application back.
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
## 2013-12-12 NOTE — Progress Notes (Signed)
Utilization Review Completed.Antwian Santaana T7/12/2013  
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
## 2013-12-12 NOTE — Progress Notes (Signed)
[facility] PROGRESS NOTE  Erin Boyd A XXXMiley-Muhammad XLK:[id number] DOB: 04/15/1986 DOA: 12/11/2013 PCP: No PCP Per Patient  Assessment/Plan: Drug Overdose -Patient now states she did not do any drugs (UDS positive for THC and amphetamines). -She does admit to occasional ETOH use. -Appreciate psych consultation. -She is completely alert/oriented.  Metabolic Acidosis -Resolved.  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 -Have asked cards for consultation. -Await their recommendations. -Unclear if unresponsive episode that led to admission related to drugs/ETOH or maybe some arrhythmia related to WPW?  UTI -Continue cipro pending cx data.    Code Status: Full Code Family Communication: Patient only  Disposition Plan: Home when ready; await cards evaluation   Consultants:  Cardiology   Antibiotics:  Cipro   Subjective: No complaints. Anxious to go home.  Objective: Filed Vitals:   12/11/13 1200 12/11/13 1412 12/11/13 2105 12/12/13 0601  BP: 140/83 119/71 133/76 121/73  Pulse: 84 68 69 64  Temp: 98.8 F (37.1 C) 98.3 F (36.8 C) 98.2 F (36.8 C) 97.9 F (36.6 C)  TempSrc:  Oral Oral Oral  Resp: 15 16 16 16   Height:  5\' 10"  (1.778 m)    Weight:  104 kg (229 lb 4.5 oz)    SpO2: 100% 99% 99% 98%    Intake/Output Summary (Last 24 hours) at 12/12/13 1020 Last data filed at 12/11/13 1440  Gross per 24 hour  Intake    500 ml  Output    950 ml  Net   -450 ml   Filed Weights   12/11/13 0810 12/11/13 1412  Weight: 103.9 kg (229 lb 0.9 oz) 104 kg (229 lb 4.5 oz)    Exam:   General:  AA Ox3  Cardiovascular: RRR, no M/R/G  Respiratory: CTA B  Abdomen: S/NT/ND/+BS  Extremities: no C/C/E, +pulses   Neurologic:  Non-focal  Data Reviewed: Basic Metabolic Panel:  Recent Labs Lab 12/11/13 0515 12/12/13 0520  NA 137 139  K 3.8 3.8  CL 97 107  CO2 14* 21  GLUCOSE 112* 95  BUN 8 10  CREATININE 0.77 0.85  CALCIUM 10.1 8.7    Liver Function Tests:  Recent Labs Lab 12/11/13 0515 12/12/13 0520  AST 41* 21  ALT 26 17  ALKPHOS 55 43  BILITOT 0.3 0.4  PROT 8.3 5.9*  ALBUMIN 4.1 2.9*   No results found for this basename: LIPASE, AMYLASE,  in the last 168 hours No results found for this basename: AMMONIA,  in the last 168 hours CBC:  Recent Labs Lab 12/11/13 0515 12/12/13 0520  WBC 8.0 8.4  NEUTROABS 5.6  --   HGB 13.9 12.6  HCT 41.7 39.5  MCV 87.8 90.6  PLT 270 157   Cardiac Enzymes: No results found for this basename: CKTOTAL, CKMB, CKMBINDEX, TROPONINI,  in the last 168 hours BNP (last 3 results) No results found for this basename: PROBNP,  in the last 8760 hours CBG: No results found for this basename: GLUCAP,  in the last 168 hours  Recent Results (from the past 240 hour(s))  MRSA PCR SCREENING     Status: None   Collection Time    12/11/13  8:15 AM      Result Value Ref Range Status   MRSA by PCR NEGATIVE  NEGATIVE Final   Comment:            The GeneXpert MRSA Assay (FDA     approved for NASAL specimens     only), is  one component of a     comprehensive MRSA colonization     surveillance program. It is not     intended to diagnose MRSA     infection nor to guide or     monitor treatment for     MRSA infections.     Studies: Ct Head Wo Contrast  12/11/2013   CLINICAL DATA:  Altered mental status.  EXAM: CT HEAD WITHOUT CONTRAST  TECHNIQUE: Contiguous axial images were obtained from the base of the skull through the vertex without intravenous contrast.  COMPARISON:  None.  FINDINGS: The ventricles and sulci are normal. No intraparenchymal hemorrhage, mass effect nor midline shift. No acute large vascular territory infarcts.  No abnormal extra-axial fluid collections. Basal cisterns are patent.  No skull fracture. The included ocular globes and orbital contents are non-suspicious. The mastoid aircells and included paranasal sinuses are well-aerated.  IMPRESSION: No acute intracranial  process ; normal noncontrast CT of the head.   Electronically Signed   By: Awilda Metroourtnay  Bloomer   On: 12/11/2013 06:14    Scheduled Meds: . antiseptic oral rinse  15 mL Mouth Rinse q12n4p  . chlorhexidine  15 mL Mouth Rinse BID  . ciprofloxacin  500 mg Oral BID  . enoxaparin (LOVENOX) injection  40 mg Subcutaneous Q24H   Continuous Infusions:   Principal Problem:   Drug overdose Active Problems:   Metabolic acidosis   Wolff-Parkinson-White (WPW) pattern   UTI (urinary tract infection)    Time spent: 35 minutes. Greater than 50% of this time was spent in direct contact with the patient coordinating care.    Erin Boyd,Erin Boyd  [facility] Pager [phone]  If 7PM-7AM, please contact night-coverage at [email or web address], password [email or web address] 12/12/2013, 10:20 AM  LOS: 1 day

## 2013-12-13 LAB — URINE CULTURE: Colony Count: >=100000

## 2014-04-09 ENCOUNTER — Encounter (HOSPITAL_COMMUNITY): Payer: Self-pay

## 2014-04-16 ENCOUNTER — Emergency Department (HOSPITAL_COMMUNITY): Payer: Self-pay

## 2014-04-16 ENCOUNTER — Emergency Department (HOSPITAL_COMMUNITY)
Admission: EM | Admit: 2014-04-16 | Discharge: 2014-04-16 | Disposition: A | Discharge status: Home / Self Care | Payer: Self-pay | Attending: Emergency Medicine | Admitting: Emergency Medicine

## 2014-04-16 ENCOUNTER — Encounter (HOSPITAL_COMMUNITY): Payer: Self-pay | Admitting: Emergency Medicine

## 2014-04-16 ENCOUNTER — Emergency Department (INDEPENDENT_AMBULATORY_CARE_PROVIDER_SITE_OTHER)
Admission: EM | Admit: 2014-04-16 | Discharge: 2014-04-16 | Disposition: A | Discharge status: Other Acute Inpatient Hospital | Payer: Self-pay | Source: Home / Self Care | Attending: Family Medicine | Admitting: Family Medicine

## 2014-04-16 DIAGNOSIS — R1110 Vomiting, unspecified: Secondary | ICD-10-CM

## 2014-04-16 DIAGNOSIS — Z3202 Encounter for pregnancy test, result negative: Secondary | ICD-10-CM | POA: Insufficient documentation

## 2014-04-16 DIAGNOSIS — Z87891 Personal history of nicotine dependence: Secondary | ICD-10-CM | POA: Insufficient documentation

## 2014-04-16 DIAGNOSIS — B9689 Other specified bacterial agents as the cause of diseases classified elsewhere: Secondary | ICD-10-CM

## 2014-04-16 DIAGNOSIS — R1031 Right lower quadrant pain: Secondary | ICD-10-CM

## 2014-04-16 DIAGNOSIS — R109 Unspecified abdominal pain: Secondary | ICD-10-CM

## 2014-04-16 DIAGNOSIS — N760 Acute vaginitis: Secondary | ICD-10-CM | POA: Insufficient documentation

## 2014-04-16 DIAGNOSIS — Z91040 Latex allergy status: Secondary | ICD-10-CM | POA: Insufficient documentation

## 2014-04-16 DIAGNOSIS — R197 Diarrhea, unspecified: Secondary | ICD-10-CM

## 2014-04-16 DIAGNOSIS — K529 Noninfective gastroenteritis and colitis, unspecified: Secondary | ICD-10-CM | POA: Insufficient documentation

## 2014-04-16 DIAGNOSIS — Z792 Long term (current) use of antibiotics: Secondary | ICD-10-CM | POA: Insufficient documentation

## 2014-04-16 LAB — URINALYSIS, ROUTINE W REFLEX MICROSCOPIC
GLUCOSE, UA: NEGATIVE mg/dL
KETONES UR: 40 mg/dL — AB
Leukocytes, UA: NEGATIVE
Nitrite: NEGATIVE
PH: 5 (ref 5.0–8.0)
Protein, ur: 30 mg/dL — AB
Specific Gravity, Urine: 1.027 (ref 1.005–1.030)
Urobilinogen, UA: 0.2 mg/dL (ref 0.0–1.0)

## 2014-04-16 LAB — WET PREP, GENITAL
TRICH WET PREP: NONE SEEN
YEAST WET PREP: NONE SEEN

## 2014-04-16 LAB — CBC WITH DIFFERENTIAL/PLATELET
Basophils Absolute: 0 10*3/uL (ref 0.0–0.1)
Basophils Relative: 0 % (ref 0–1)
EOS ABS: 0 10*3/uL (ref 0.0–0.7)
EOS PCT: 0 % (ref 0–5)
HEMATOCRIT: 44 % (ref 36.0–46.0)
HEMOGLOBIN: 15.1 g/dL — AB (ref 12.0–15.0)
LYMPHS ABS: 0.4 10*3/uL — AB (ref 0.7–4.0)
Lymphocytes Relative: 5 % — ABNORMAL LOW (ref 12–46)
MCH: 30 pg (ref 26.0–34.0)
MCHC: 34.3 g/dL (ref 30.0–36.0)
MCV: 87.5 fL (ref 78.0–100.0)
MONO ABS: 0.2 10*3/uL (ref 0.1–1.0)
MONOS PCT: 2 % — AB (ref 3–12)
Neutro Abs: 7.2 10*3/uL (ref 1.7–7.7)
Neutrophils Relative %: 93 % — ABNORMAL HIGH (ref 43–77)
Platelets: 181 10*3/uL (ref 150–400)
RBC: 5.03 MIL/uL (ref 3.87–5.11)
RDW: 13.1 % (ref 11.5–15.5)
WBC: 7.7 10*3/uL (ref 4.0–10.5)

## 2014-04-16 LAB — COMPREHENSIVE METABOLIC PANEL
ALT: 20 U/L (ref 0–35)
AST: 24 U/L (ref 0–37)
Albumin: 3.9 g/dL (ref 3.5–5.2)
Alkaline Phosphatase: 52 U/L (ref 39–117)
Anion gap: 16 — ABNORMAL HIGH (ref 5–15)
BILIRUBIN TOTAL: 0.3 mg/dL (ref 0.3–1.2)
BUN: 8 mg/dL (ref 6–23)
CO2: 21 meq/L (ref 19–32)
Calcium: 9.6 mg/dL (ref 8.4–10.5)
Chloride: 101 mEq/L (ref 96–112)
Creatinine, Ser: 0.71 mg/dL (ref 0.50–1.10)
GFR calc Af Amer: >90 mL/min (ref >90)
GLUCOSE: 141 mg/dL — AB (ref 70–99)
Potassium: 3.7 mEq/L (ref 3.7–5.3)
SODIUM: 138 meq/L (ref 137–147)
Total Protein: 7.8 g/dL (ref 6.0–8.3)

## 2014-04-16 LAB — URINE MICROSCOPIC-ADD ON

## 2014-04-16 LAB — LIPASE, BLOOD: Lipase: 18 U/L (ref 11–59)

## 2014-04-16 LAB — POC URINE PREG, ED: Preg Test, Ur: NEGATIVE

## 2014-04-16 MED ORDER — SODIUM CHLORIDE 0.9 % IV BOLUS (SEPSIS)
1000.0000 mL | Freq: Once | INTRAVENOUS | Status: AC
  Administered 2014-04-16: 1000 mL via INTRAVENOUS

## 2014-04-16 MED ORDER — ONDANSETRON 4 MG PO TBDP
8.0000 mg | ORAL_TABLET | Freq: Once | ORAL | Status: AC
  Administered 2014-04-16: 8 mg via ORAL
  Filled 2014-04-16: qty 2

## 2014-04-16 MED ORDER — ONDANSETRON 4 MG PO TBDP: 4.0000 mg | ORAL_TABLET | Freq: Three times a day (TID) | ORAL | Status: DC | PRN

## 2014-04-16 MED ORDER — METRONIDAZOLE 0.75 % VA GEL: 1.0000 | Freq: Two times a day (BID) | VAGINAL | Status: AC

## 2014-04-16 MED ORDER — IOHEXOL 300 MG/ML  SOLN
100.0000 mL | Freq: Once | INTRAMUSCULAR | Status: AC | PRN
  Administered 2014-04-16: 100 mL via INTRAVENOUS

## 2014-04-16 MED ORDER — HYDROMORPHONE HCL 1 MG/ML IJ SOLN
1.0000 mg | Freq: Once | INTRAMUSCULAR | Status: AC
  Administered 2014-04-16: 1 mg via INTRAVENOUS
  Filled 2014-04-16: qty 1

## 2014-04-16 MED ORDER — ONDANSETRON HCL 4 MG/2ML IJ SOLN
4.0000 mg | Freq: Once | INTRAMUSCULAR | Status: AC
  Administered 2014-04-16: 4 mg via INTRAVENOUS
  Filled 2014-04-16: qty 2

## 2014-04-16 MED ORDER — IOHEXOL 300 MG/ML  SOLN
25.0000 mL | INTRAMUSCULAR | Status: AC
  Administered 2014-04-16: 25 mL via ORAL

## 2014-04-16 NOTE — ED Notes (Signed)
Spoke with lab regarding CBC. States labwork needs to be redrawn.

## 2014-04-16 NOTE — ED Notes (Signed)
Pt has had episode of emesis with abd pain

## 2014-04-16 NOTE — ED Notes (Signed)
Attempted IV x 2.  Second RN to attempt.  

## 2014-04-16 NOTE — ED Provider Notes (Signed)
CSN: [id number]     Arrival date & time 04/16/14  1240 History   First MD Initiated Contact with Patient 04/16/14 1548     Chief Complaint  Patient presents with  . Emesis     (Consider location/radiation/quality/duration/timing/severity/associated sxs/prior Treatment) Patient is a 28 y.o. female presenting with vomiting. The history is provided by the patient. No language interpreter was used.  Emesis Severity:  Moderate Duration:  12 hours Timing:  Intermittent Number of daily episodes:  "too many to count" Quality:  Stomach contents Feeding tolerance: nothing. Progression:  Unchanged Chronicity:  New Recent urination:  Normal Relieved by:  Nothing Exacerbated by: PO. Associated symptoms: abdominal pain, chills and diarrhea   Abdominal pain:    Location:  Periumbilical and RLQ   Quality:  Aching and dull   Severity:  Severe   Onset quality:  Gradual   Duration:  12 hours   Timing:  Constant   Progression:  Worsening   Chronicity:  New Diarrhea:    Quality:  Semi-solid   Number of occurrences:  "too many to count"   Timing:  Intermittent   Progression:  Unchanged Risk factors: no diabetes, no prior abdominal surgery, no sick contacts and no suspect food intake     Past Medical History  Diagnosis Date  . No pertinent past medical history    Past Surgical History  Procedure Laterality Date  . Pediatric dental surgery    . Induced abortion     No family history on file. History  Substance Use Topics  . Smoking status: Former Smoker -- 0.50 packs/day    Types: Cigarettes  . Smokeless tobacco: Never Used  . Alcohol Use: 0.0 oz/week     Comment: occasional   OB History    Gravida Para Term Preterm AB TAB SAB Ectopic Multiple Living   4 1 1  0 1 0 1 0 0 1     Review of Systems  Constitutional: Positive for chills. Negative for fever.  Respiratory: Negative for chest tightness and shortness of breath.   Gastrointestinal: Positive for nausea, vomiting,  abdominal pain and diarrhea. Negative for abdominal distention.  Genitourinary: Positive for flank pain. Negative for dysuria, hematuria, vaginal bleeding and vaginal discharge.  Musculoskeletal: Negative for gait problem.  All other systems reviewed and are negative.     Allergies  Latex  Home Medications   Prior to Admission medications   Medication Sig Start Date End Date Taking? Authorizing Provider  ciprofloxacin (CIPRO) 500 MG tablet Take 1 tablet (500 mg total) by mouth 2 (two) times daily. For 5 days Patient not taking: Reported on 04/16/2014 12/12/13   Henderson Cloud, MD   BP 127/71 mmHg  Pulse 67  Temp(Src) 98.1 F (36.7 C) (Oral)  Resp 18  SpO2 100% Physical Exam  Constitutional: She is oriented to person, place, and time. She appears well-developed and well-nourished.  HENT:  Head: Normocephalic.  Mouth/Throat: Oropharynx is clear and moist.  Eyes: Conjunctivae are normal.  Cardiovascular: Normal rate, regular rhythm and intact distal pulses.   Pulmonary/Chest: Effort normal and breath sounds normal.  Abdominal: Soft. Bowel sounds are normal. She exhibits no distension. There is tenderness in the right lower quadrant, epigastric area and periumbilical area. There is tenderness at McBurney's point. There is no rigidity, no rebound, no guarding and no CVA tenderness. No hernia.  + rovsings  Neurological: She is alert and oriented to person, place, and time.  Skin: Skin is warm.  Vitals reviewed.  ED Course  Procedures (including critical care time) Labs Review Labs Reviewed  WET PREP, GENITAL - Abnormal; Notable for the following:    Clue Cells Wet Prep HPF POC RARE (*)    WBC, Wet Prep HPF POC RARE (*)    All other components within normal limits  COMPREHENSIVE METABOLIC PANEL - Abnormal; Notable for the following:    Glucose, Bld 141 (*)    Anion gap 16 (*)    All other components within normal limits  URINALYSIS, ROUTINE W REFLEX MICROSCOPIC -  Abnormal; Notable for the following:    APPearance TURBID (*)    Hgb urine dipstick TRACE (*)    Bilirubin Urine SMALL (*)    Ketones, ur 40 (*)    Protein, ur 30 (*)    All other components within normal limits  CBC WITH DIFFERENTIAL - Abnormal; Notable for the following:    Hemoglobin 15.1 (*)    Neutrophils Relative % 93 (*)    Lymphocytes Relative 5 (*)    Lymphs Abs 0.4 (*)    Monocytes Relative 2 (*)    All other components within normal limits  URINE MICROSCOPIC-ADD ON - Abnormal; Notable for the following:    Squamous Epithelial / LPF FEW (*)    Bacteria, UA FEW (*)    All other components within normal limits  GC/CHLAMYDIA PROBE AMP  LIPASE, BLOOD  CBC WITH DIFFERENTIAL  POC URINE PREG, ED    Imaging Review Ct Abdomen Pelvis W Contrast  04/16/2014   CLINICAL DATA:  Right lower quadrant abdominal pain with nausea, vomiting, and diarrhea for 1 day  EXAM: CT ABDOMEN AND PELVIS WITH CONTRAST  TECHNIQUE: Multidetector CT imaging of the abdomen and pelvis was performed using the standard protocol following bolus administration of intravenous contrast. Oral contrast was also administered.  CONTRAST:  100mL OMNIPAQUE IOHEXOL 300 MG/ML  SOLN  COMPARISON:  None.  FINDINGS: Lung bases are clear.  Liver is prominent measuring 22.0 cm in length. There is hepatic steatosis. No focal liver lesions are identified. The gallbladder wall is not thickened. There is no biliary duct dilatation.  Spleen, pancreas, and adrenals appear normal. Kidneys bilaterally show no evidence of mass or hydronephrosis on either side. There are no appreciable renal or ureteral calculi on either side.  In the pelvis, the urinary bladder is midline. There is borderline thickening of the wall of the urinary bladder. There is no pelvic mass or fluid collection. The appendix appears unremarkable. Terminal ileum appears normal.  There is no bowel obstruction.  No free air or portal venous air.  There is no ascites,  adenopathy, or abscess in the abdomen or pelvis. Aorta appears unremarkable. No aneurysm seen. There are no blastic or lytic bone lesions.  IMPRESSION: Prominent liver with hepatic steatosis.  No bowel obstruction. No abscess. Periappendiceal region appears normal. No renal or ureteral calculus. No hydronephrosis.  Borderline thickening of the wall of the urinary bladder. Question a degree of cystitis.   Electronically Signed   By: Bretta BangWilliam  Woodruff M.D.   On: 04/16/2014 18:45     EKG Interpretation None      MDM   Final diagnoses:  Abdominal pain  Gastroenteritis  BV (bacterial vaginosis)    28 y/o female with n/v/d and abdominal pain. NBNB emesis and too many to count episodes of loose stools. Unable to tolerate PO. No fevers, vaginal bleeding/discharge, or dysuria. Focal RLQ tenderness with + rovsings. Labs unremarkable. Will get CT a/p to r/o appendicitis.  CT without appendicitis or additional concerning findings. Wet prep with clue cells and given vaginal itching, will treat for BV. Likely early gastroenteritis. Plan for zofran for nausea. Pt comfortable on reassessment. Appropriate for discharge with PCP f/u if symptoms persist or return to ED.   Abagail KitchensMegan Markel Mergenthaler, MD 04/16/14 [id number]  Enid SkeensJoshua M Zavitz, MD 04/17/14 [phone]

## 2014-04-16 NOTE — ED Provider Notes (Signed)
Erin Boyd is a 28 y.o. female who presents to Urgent Care today for Abdominal pain and vomiting and diarrhea. Patient developed diarrhea yesterday afternoon and awoke at 4:00 this morning with vomiting. She has had intractable vomiting since then. She's noted a little bit of blood in her stool. She additionally notes severe diffuse abdominal pain. No fevers or chills. No history of significant abdominal surgery.   Past Medical History  Diagnosis Date  . No pertinent past medical history    Past Surgical History  Procedure Laterality Date  . Pediatric dental surgery    . Induced abortion     History  Substance Use Topics  . Smoking status: Former Smoker -- 0.50 packs/day    Types: Cigarettes  . Smokeless tobacco: Never Used  . Alcohol Use: 0.0 oz/week     Comment: occasional   ROS as above Medications: No current facility-administered medications for this encounter.   Current Outpatient Prescriptions  Medication Sig Dispense Refill  . ciprofloxacin (CIPRO) 500 MG tablet Take 1 tablet (500 mg total) by mouth 2 (two) times daily. For 5 days 10 tablet 0  . tetrahydrozoline (VISINE) 0.05 % ophthalmic solution Place 2 drops into both eyes 2 (two) times daily as needed (dry eyes).      Allergies  Allergen Reactions  . Latex Rash     Exam:  Temp(Src) 98.3 F (36.8 C) (Oral)  Resp 24  SpO2 100% Gen: in pain appearing HEENT: EOMI,  MMM Lungs: Normal work of breathing. CTABL Heart: RRR no MRG VZD:GLOVFIEPPIAbd:hypoactive bowel sounds. Guarding and rebound present in right lower quadrant. Tender palpation no masses palpated. Patient is unable to stand up due to abdominal pain. Exts: Brisk capillary refill, warm and well perfused.   No results found for this or any previous visit (from the past 24 hour(s)). No results found.  Assessment and Plan: 28 y.o. female with severe abdominal pain with vomiting and diarrhea. Concerning for appendicitis. Transfer to the emergency  department via shuttle for further evaluation and management.  Discussed warning signs or symptoms. Please see discharge instructions. Patient expresses understanding.     Rodolph BongEvan S Antony Sian, MD 04/16/14 1201

## 2014-04-16 NOTE — ED Notes (Signed)
Called [place] ED spoke with 1st nurse Jody RN, gave Chief complaint and reason for pt to be transferred to ED via shuttle

## 2014-04-16 NOTE — ED Notes (Signed)
Vomiting and diarrhea since yesterday  lmp LAST WEEK

## 2014-04-16 NOTE — Discharge Instructions (Signed)

## 2014-04-17 ENCOUNTER — Emergency Department (HOSPITAL_BASED_OUTPATIENT_CLINIC_OR_DEPARTMENT_OTHER)
Admission: EM | Admit: 2014-04-17 | Discharge: 2014-04-17 | Disposition: A | Discharge status: Home / Self Care | Payer: Self-pay | Attending: Emergency Medicine | Admitting: Emergency Medicine

## 2014-04-17 ENCOUNTER — Encounter (HOSPITAL_BASED_OUTPATIENT_CLINIC_OR_DEPARTMENT_OTHER): Payer: Self-pay | Admitting: *Deleted

## 2014-04-17 DIAGNOSIS — R630 Anorexia: Secondary | ICD-10-CM | POA: Insufficient documentation

## 2014-04-17 DIAGNOSIS — Z79899 Other long term (current) drug therapy: Secondary | ICD-10-CM | POA: Insufficient documentation

## 2014-04-17 DIAGNOSIS — Z87891 Personal history of nicotine dependence: Secondary | ICD-10-CM | POA: Insufficient documentation

## 2014-04-17 DIAGNOSIS — R197 Diarrhea, unspecified: Secondary | ICD-10-CM

## 2014-04-17 DIAGNOSIS — Z792 Long term (current) use of antibiotics: Secondary | ICD-10-CM | POA: Insufficient documentation

## 2014-04-17 DIAGNOSIS — R1084 Generalized abdominal pain: Secondary | ICD-10-CM

## 2014-04-17 DIAGNOSIS — R112 Nausea with vomiting, unspecified: Secondary | ICD-10-CM | POA: Insufficient documentation

## 2014-04-17 DIAGNOSIS — Z91040 Latex allergy status: Secondary | ICD-10-CM | POA: Insufficient documentation

## 2014-04-17 DIAGNOSIS — R1110 Vomiting, unspecified: Secondary | ICD-10-CM

## 2014-04-17 LAB — BASIC METABOLIC PANEL
ANION GAP: 16 — AB (ref 5–15)
BUN: 9 mg/dL (ref 6–23)
CALCIUM: 9.5 mg/dL (ref 8.4–10.5)
CO2: 21 mEq/L (ref 19–32)
Chloride: 102 mEq/L (ref 96–112)
Creatinine, Ser: 0.8 mg/dL (ref 0.50–1.10)
GFR calc Af Amer: >90 mL/min (ref >90)
GLUCOSE: 104 mg/dL — AB (ref 70–99)
POTASSIUM: 3.6 meq/L — AB (ref 3.7–5.3)
SODIUM: 139 meq/L (ref 137–147)

## 2014-04-17 LAB — GC/CHLAMYDIA PROBE AMP
CT PROBE, AMP APTIMA: NEGATIVE
GC PROBE AMP APTIMA: NEGATIVE

## 2014-04-17 MED ORDER — METOCLOPRAMIDE HCL 10 MG PO TABS: 10.0000 mg | ORAL_TABLET | Freq: Three times a day (TID) | ORAL | Status: DC | PRN

## 2014-04-17 MED ORDER — KETOROLAC TROMETHAMINE 15 MG/ML IJ SOLN
15.0000 mg | Freq: Once | INTRAMUSCULAR | Status: AC
  Administered 2014-04-17: 15 mg via INTRAVENOUS
  Filled 2014-04-17: qty 1

## 2014-04-17 MED ORDER — SODIUM CHLORIDE 0.9 % IV BOLUS (SEPSIS)
1000.0000 mL | Freq: Once | INTRAVENOUS | Status: AC
Start: 2014-04-17 — End: 2014-04-17
  Administered 2014-04-17: 1000 mL via INTRAVENOUS

## 2014-04-17 MED ORDER — METOCLOPRAMIDE HCL 5 MG/ML IJ SOLN
10.0000 mg | Freq: Once | INTRAMUSCULAR | Status: AC
  Administered 2014-04-17: 10 mg via INTRAVENOUS
  Filled 2014-04-17: qty 2

## 2014-04-17 MED ORDER — FENTANYL CITRATE 0.05 MG/ML IJ SOLN
50.0000 ug | Freq: Once | INTRAMUSCULAR | Status: AC
  Administered 2014-04-17: 50 ug via INTRAVENOUS
  Filled 2014-04-17: qty 2

## 2014-04-17 NOTE — ED Provider Notes (Signed)
CSN: [id number]     Arrival date & time 04/17/14  1548 History   First MD Initiated Contact with Patient 04/17/14 1602     Chief Complaint  Patient presents with  . Abdominal Pain  . Emesis     (Consider location/radiation/quality/duration/timing/severity/associated sxs/prior Treatment) HPI Comments: 28 year old female with history of WPW, urine infection presents with recurrent vomiting and diarrhea for 3 days. Myself along with the resident physician saw her yesterday [facility] for similar concerns. Patient had improvement in the ER however feels that her symptoms have persisted since being home. Subjective low-grade fever. Diffuse cramping and she feels diarrhea is improving however vomiting is worse. Nonfocal pain, no significant abdominal surgery history, past smoker. Patient denies recent antibiotics, travel, sick contacts or new foods. No specific foods worsen as she has not had an appetite for any type of food except for soup.  Patient is a 28 y.o. female presenting with abdominal pain and vomiting. The history is provided by the patient.  Abdominal Pain Associated symptoms: diarrhea, nausea and vomiting   Associated symptoms: no chest pain, no chills, no dysuria, no fever, no shortness of breath, no vaginal bleeding and no vaginal discharge   Emesis Associated symptoms: abdominal pain and diarrhea   Associated symptoms: no chills and no headaches     Past Medical History  Diagnosis Date  . No pertinent past medical history    Past Surgical History  Procedure Laterality Date  . Pediatric dental surgery    . Induced abortion     No family history on file. History  Substance Use Topics  . Smoking status: Former Smoker -- 0.50 packs/day    Types: Cigarettes  . Smokeless tobacco: Never Used  . Alcohol Use: 0.0 oz/week     Comment: occasional   OB History    Gravida Para Term Preterm AB TAB SAB Ectopic Multiple Living   4 1 1  0 1 0 1 0 0 1     Review of Systems   Constitutional: Positive for appetite change. Negative for fever and chills.  HENT: Negative for congestion.   Eyes: Negative for visual disturbance.  Respiratory: Negative for shortness of breath.   Cardiovascular: Negative for chest pain.  Gastrointestinal: Positive for nausea, vomiting, abdominal pain and diarrhea. Negative for blood in stool.  Genitourinary: Negative for dysuria, flank pain, vaginal bleeding and vaginal discharge.  Musculoskeletal: Negative for back pain, neck pain and neck stiffness.  Skin: Negative for rash.  Neurological: Negative for light-headedness and headaches.      Allergies  Latex  Home Medications   Prior to Admission medications   Medication Sig Start Date End Date Taking? Authorizing Provider  ciprofloxacin (CIPRO) 500 MG tablet Take 1 tablet (500 mg total) by mouth 2 (two) times daily. For 5 days Patient not taking: Reported on 04/16/2014 12/12/13   Henderson Cloud, MD  metoCLOPramide (REGLAN) 10 MG tablet Take 1 tablet (10 mg total) by mouth every 8 (eight) hours as needed for nausea. 04/17/14   Enid Skeens, MD  metroNIDAZOLE (METROGEL VAGINAL) 0.75 % vaginal gel Place 1 Applicatorful vaginally 2 (two) times daily. 04/16/14 04/23/14  Abagail Kitchens, MD  ondansetron (ZOFRAN ODT) 4 MG disintegrating tablet Take 1 tablet (4 mg total) by mouth every 8 (eight) hours as needed for nausea or vomiting. 04/16/14   Abagail Kitchens, MD   BP 130/100 mmHg  Pulse 87  Temp(Src) 100 F (37.8 C) (Oral)  Resp 18  Ht 5\' 11"  (1.803 m)  Wt 229 lb (103.874 kg)  BMI 31.95 kg/m2  SpO2 100%  LMP 04/13/2014 Physical Exam  Constitutional: She is oriented to person, place, and time. She appears well-developed and well-nourished.  HENT:  Head: Normocephalic and atraumatic.  Mild dry mucous membranes  Eyes: Conjunctivae are normal. Right eye exhibits no discharge. Left eye exhibits no discharge.  Neck: Normal range of motion. Neck supple. No tracheal deviation  present.  Cardiovascular: Normal rate and regular rhythm.   Pulmonary/Chest: Effort normal and breath sounds normal.  Abdominal: Soft. She exhibits no distension. There is tenderness. There is no guarding.  Diffuse mild tenderness no guarding no rigidity  Musculoskeletal: She exhibits no edema.  Neurological: She is alert and oriented to person, place, and time.  Skin: Skin is warm. No rash noted.  Psychiatric: She has a normal mood and affect.  Nursing note and vitals reviewed.   ED Course  Procedures (including critical care time) Labs Review Labs Reviewed  BASIC METABOLIC PANEL - Abnormal; Notable for the following:    Potassium 3.6 (*)    Glucose, Bld 104 (*)    Anion gap 16 (*)    All other components within normal limits  URINALYSIS, ROUTINE W REFLEX MICROSCOPIC  PREGNANCY, URINE  GI PATHOGEN PANEL BY PCR, STOOL    Imaging Review Ct Abdomen Pelvis W Contrast  04/16/2014   CLINICAL DATA:  Right lower quadrant abdominal pain with nausea, vomiting, and diarrhea for 1 day  EXAM: CT ABDOMEN AND PELVIS WITH CONTRAST  TECHNIQUE: Multidetector CT imaging of the abdomen and pelvis was performed using the standard protocol following bolus administration of intravenous contrast. Oral contrast was also administered.  CONTRAST:  100mL OMNIPAQUE IOHEXOL 300 MG/ML  SOLN  COMPARISON:  None.  FINDINGS: Lung bases are clear.  Liver is prominent measuring 22.0 cm in length. There is hepatic steatosis. No focal liver lesions are identified. The gallbladder wall is not thickened. There is no biliary duct dilatation.  Spleen, pancreas, and adrenals appear normal. Kidneys bilaterally show no evidence of mass or hydronephrosis on either side. There are no appreciable renal or ureteral calculi on either side.  In the pelvis, the urinary bladder is midline. There is borderline thickening of the wall of the urinary bladder. There is no pelvic mass or fluid collection. The appendix appears unremarkable.  Terminal ileum appears normal.  There is no bowel obstruction.  No free air or portal venous air.  There is no ascites, adenopathy, or abscess in the abdomen or pelvis. Aorta appears unremarkable. No aneurysm seen. There are no blastic or lytic bone lesions.  IMPRESSION: Prominent liver with hepatic steatosis.  No bowel obstruction. No abscess. Periappendiceal region appears normal. No renal or ureteral calculus. No hydronephrosis.  Borderline thickening of the wall of the urinary bladder. Question a degree of cystitis.   Electronically Signed   By: Bretta BangWilliam  Woodruff M.D.   On: 04/16/2014 18:45     EKG Interpretation None      MDM   Final diagnoses:  Diarrhea  Intractable vomiting with nausea, vomiting of unspecified type  Abdominal pain, generalized   Patient with recurrent vomiting and diarrhea, clinically gastroenteritis. Patient had a CT scan yesterday with no acute findings. Patient does not have any risk factors for hepatitis and no travel recently. Plan for supportive care, IV fluids, pain meds, nausea meds and electrolyte check.  Patient improved significantly in the ER. Labs unremarkable. Patient had a CT scan yesterday no indication for repeat today. Outpatient follow-up.  Results and differential diagnosis were discussed with the patient/parent/guardian. Close follow up outpatient was discussed, comfortable with the plan.   Medications  sodium chloride 0.9 % bolus 1,000 mL (0 mLs Intravenous Stopped 04/17/14 1818)  metoCLOPramide (REGLAN) injection 10 mg (10 mg Intravenous Given 04/17/14 1646)  ketorolac (TORADOL) 15 MG/ML injection 15 mg (15 mg Intravenous Given 04/17/14 1646)  fentaNYL (SUBLIMAZE) injection 50 mcg (50 mcg Intravenous Given 04/17/14 1700)    Filed Vitals:   04/17/14 1553  BP: 130/100  Pulse: 87  Temp: 100 F (37.8 C)  TempSrc: Oral  Resp: 18  Height: 5\' 11"  (1.803 m)  Weight: 229 lb (103.874 kg)  SpO2: 100%    Final diagnoses:  Diarrhea   Intractable vomiting with nausea, vomiting of unspecified type  Abdominal pain, generalized       Enid SkeensJoshua M Shonette Rhames, MD 04/17/14 1849

## 2014-04-17 NOTE — ED Notes (Signed)
Pt unable to urinate at this time.  

## 2014-04-17 NOTE — Discharge Instructions (Signed)
If you were given medicines take as directed.  If you are on coumadin or contraceptives realize their levels and effectiveness is altered by many different medicines.  If you have any reaction (rash, tongues swelling, other) to the medicines stop taking and see a physician.   Please follow up as directed and return to the ER or see a physician for new or worsening symptoms.  Thank you. Filed Vitals:   04/17/14 1553  BP: 130/100  Pulse: 87  Temp: 100 F (37.8 C)  TempSrc: Oral  Resp: 18  Height: 5\' 11"  (1.803 m)  Weight: 229 lb (103.874 kg)  SpO2: 100%

## 2014-04-17 NOTE — ED Notes (Signed)
MD at bedside. 

## 2014-04-17 NOTE — ED Notes (Signed)
Abdominal pain and vomiting. [facility]. Diagnosed with a virus.

## 2014-04-17 NOTE — ED Notes (Signed)
Pt has not had any emesis while here in the ED.

## 2014-04-18 LAB — GI PATHOGEN PANEL BY PCR, STOOL
C DIFFICILE TOXIN A/B: POSITIVE
CAMPYLOBACTER BY PCR: NEGATIVE
CRYPTOSPORIDIUM BY PCR: POSITIVE
E COLI (STEC): NEGATIVE
E coli (ETEC) LT/ST: NEGATIVE
E coli 0157 by PCR: NEGATIVE
G lamblia by PCR: NEGATIVE
NOROVIRUS G1/G2: NEGATIVE
Rotavirus A by PCR: NEGATIVE
Salmonella by PCR: NEGATIVE
Shigella by PCR: NEGATIVE

## 2014-05-11 ENCOUNTER — Encounter (HOSPITAL_BASED_OUTPATIENT_CLINIC_OR_DEPARTMENT_OTHER): Payer: Self-pay

## 2014-05-11 ENCOUNTER — Emergency Department (HOSPITAL_BASED_OUTPATIENT_CLINIC_OR_DEPARTMENT_OTHER)
Admission: EM | Admit: 2014-05-11 | Discharge: 2014-05-11 | Disposition: A | Discharge status: Home / Self Care | Payer: Self-pay | Attending: Emergency Medicine | Admitting: Emergency Medicine

## 2014-05-11 DIAGNOSIS — J020 Streptococcal pharyngitis: Secondary | ICD-10-CM | POA: Insufficient documentation

## 2014-05-11 DIAGNOSIS — Z792 Long term (current) use of antibiotics: Secondary | ICD-10-CM | POA: Insufficient documentation

## 2014-05-11 DIAGNOSIS — Z791 Long term (current) use of non-steroidal anti-inflammatories (NSAID): Secondary | ICD-10-CM | POA: Insufficient documentation

## 2014-05-11 DIAGNOSIS — Z91040 Latex allergy status: Secondary | ICD-10-CM | POA: Insufficient documentation

## 2014-05-11 DIAGNOSIS — Z3202 Encounter for pregnancy test, result negative: Secondary | ICD-10-CM | POA: Insufficient documentation

## 2014-05-11 DIAGNOSIS — Z87891 Personal history of nicotine dependence: Secondary | ICD-10-CM | POA: Insufficient documentation

## 2014-05-11 LAB — URINALYSIS, ROUTINE W REFLEX MICROSCOPIC
BILIRUBIN URINE: NEGATIVE
Glucose, UA: NEGATIVE mg/dL
HGB URINE DIPSTICK: NEGATIVE
Ketones, ur: NEGATIVE mg/dL
Leukocytes, UA: NEGATIVE
Nitrite: NEGATIVE
PROTEIN: NEGATIVE mg/dL
Specific Gravity, Urine: 1.004 — ABNORMAL LOW (ref 1.005–1.030)
Urobilinogen, UA: 1 mg/dL (ref 0.0–1.0)
pH: 6 (ref 5.0–8.0)

## 2014-05-11 LAB — RAPID STREP SCREEN (MED CTR MEBANE ONLY): STREPTOCOCCUS, GROUP A SCREEN (DIRECT): POSITIVE — AB

## 2014-05-11 LAB — PREGNANCY, URINE: Preg Test, Ur: NEGATIVE

## 2014-05-11 MED ORDER — KETOROLAC TROMETHAMINE 60 MG/2ML IM SOLN
60.0000 mg | Freq: Once | INTRAMUSCULAR | Status: AC
  Administered 2014-05-11: 60 mg via INTRAMUSCULAR

## 2014-05-11 MED ORDER — NAPROXEN 500 MG PO TABS: 500.0000 mg | ORAL_TABLET | Freq: Two times a day (BID) | ORAL | Status: DC

## 2014-05-11 MED ORDER — PENICILLIN G BENZATHINE 1200000 UNIT/2ML IM SUSP
1.2000 10*6.[IU] | Freq: Once | INTRAMUSCULAR | Status: AC
  Administered 2014-05-11: 1.2 10*6.[IU] via INTRAMUSCULAR

## 2014-05-11 MED ORDER — ACETAMINOPHEN 325 MG PO TABS
650.0000 mg | ORAL_TABLET | Freq: Once | ORAL | Status: AC
  Administered 2014-05-11: 650 mg via ORAL

## 2014-05-11 NOTE — Discharge Instructions (Signed)
Please read and follow all provided instructions.  Your diagnoses today include:  1. Strep throat     Tests performed today include:  Strep test: was POSITIVE for strep throat  Vital signs. See below for your results today.   Medications prescribed:  You were given a one-time shot of penicillin to treat your strep throat.    Naproxen - anti-inflammatory pain medication  Do not exceed 500mg  naproxen every 12 hours, take with food  You have been prescribed an anti-inflammatory medication or NSAID. Take with food. Take smallest effective dose for the shortest duration needed for your pain. Stop taking if you experience stomach pain or vomiting.   Take any medications prescribed only as directed.   Home care instructions:  Please read the educational materials provided and follow any instructions contained in this packet.  Follow-up instructions: Please follow-up with your primary care provider as needed for further evaluation of your symptoms.  Return instructions:   Please return to the Emergency Department if you experience worsening symptoms.   Return if you have worsening problems swallowing, your neck becomes swollen, you cannot swallow your saliva or your voice becomes muffled.   Return with high persistent fever, persistent vomiting, or if you have trouble breathing.   Please return if you have any other emergent concerns.  Additional Information:  Your vital signs today were: BP 121/63 mmHg   Pulse 90   Temp(Src) 99.8 F (37.7 C) (Oral)   Resp 18   Ht 5\' 10"  (1.778 m)   Wt 220 lb (99.791 kg)   BMI 31.57 kg/m2   SpO2 98%   LMP 04/13/2014 If your blood pressure (BP) was elevated above 135/85 this visit, please have this repeated by your doctor within one month. --------------

## 2014-05-11 NOTE — ED Provider Notes (Signed)
CSN: [id number]     Arrival date & time 05/11/14  1241 History   First MD Initiated Contact with Patient 05/11/14 1458     Chief Complaint  Patient presents with  . Sore Throat     (Consider location/radiation/quality/duration/timing/severity/associated sxs/prior Treatment) HPI Comments: Patient presents with three-day history of sore throat, fever, body aches, and headache. Patient reports pain with swallowing. She has been drinking fluids but not eating a lot of solids. No treatment prior to arrival. No nausea, vomiting, or diarrhea. No urinary symptoms. The onset of this condition was acute. The course is constant. Aggravating factors: swallowing. Alleviating factors: none.    Patient is a 28 y.o. female presenting with pharyngitis. The history is provided by the patient.  Sore Throat Associated symptoms include fatigue, a fever, headaches and a sore throat. Pertinent negatives include no abdominal pain, chills, congestion, coughing, myalgias, nausea, rash or vomiting.    Past Medical History  Diagnosis Date  . No pertinent past medical history    Past Surgical History  Procedure Laterality Date  . Pediatric dental surgery    . Induced abortion     No family history on file. History  Substance Use Topics  . Smoking status: Former Smoker -- 0.50 packs/day    Types: Cigarettes  . Smokeless tobacco: Never Used  . Alcohol Use: 0.0 oz/week     Comment: occasional   OB History    Gravida Para Term Preterm AB TAB SAB Ectopic Multiple Living   _0 0 1 0 1 0 0 1     Review of Systems  Constitutional: Positive for fever and fatigue. Negative for chills.  HENT: Positive for sore throat. Negative for congestion, ear pain, rhinorrhea and sinus pressure.   Eyes: Negative for redness.  Respiratory: Negative for cough and wheezing.   Gastrointestinal: Negative for nausea, vomiting, abdominal pain and diarrhea.  Genitourinary: Negative for dysuria.  Musculoskeletal: Negative for  myalgias and neck stiffness.  Skin: Negative for rash.  Neurological: Positive for headaches.  Hematological: Negative for adenopathy.      Allergies  Latex  Home Medications   Prior to Admission medications   Medication Sig Start Date End Date Taking? Authorizing Provider  ciprofloxacin (CIPRO) 500 MG tablet Take 1 tablet (500 mg total) by mouth 2 (two) times daily. For 5 days Patient not taking: Reported on 04/16/2014 12/12/13   Erline Hau, MD  metoCLOPramide (REGLAN) 10 MG tablet Take 1 tablet (10 mg total) by mouth every 8 (eight) hours as needed for nausea. 04/17/14   Mariea Clonts, MD  naproxen (NAPROSYN) 500 MG tablet Take 1 tablet (500 mg total) by mouth 2 (two) times daily. 05/11/14   Carlisle Cater, PA-C  ondansetron (ZOFRAN ODT) 4 MG disintegrating tablet Take 1 tablet (4 mg total) by mouth every 8 (eight) hours as needed for nausea or vomiting. 04/16/14   Amparo Bristol, MD   BP 121/63 mmHg  Pulse 90  Temp(Src) 99.8 F (37.7 C) (Oral)  Resp 18  Ht _1  (1.778 m)  Wt 220 lb (99.791 kg)  BMI 31.57 kg/m2  SpO2 98%  LMP 04/13/2014   Physical Exam  Constitutional: She appears well-developed and well-nourished.  HENT:  Head: Normocephalic and atraumatic.  Right Ear: Tympanic membrane, external ear and ear canal normal.  Left Ear: Tympanic membrane, external ear and ear canal normal.  Nose: Nose normal. No mucosal edema or rhinorrhea.  Mouth/Throat: Uvula is midline and mucous membranes are normal. Mucous  membranes are not dry. No oral lesions. No trismus in the jaw. No uvula swelling. Oropharyngeal exudate, posterior oropharyngeal edema and posterior oropharyngeal erythema present. No tonsillar abscesses.  Eyes: Conjunctivae are normal. Right eye exhibits no discharge. Left eye exhibits no discharge.  Neck: Normal range of motion. Neck supple.  Cardiovascular: Normal rate, regular rhythm and normal heart sounds.   Pulmonary/Chest: Effort normal and breath  sounds normal. No respiratory distress. She has no wheezes. She has no rales.  Abdominal: Soft. There is no tenderness.  Lymphadenopathy:    She has cervical adenopathy.  Neurological: She is alert.  Skin: Skin is warm and dry.  Psychiatric: She has a normal mood and affect.  Nursing note and vitals reviewed.   ED Course  Procedures (including critical care time) Labs Review Labs Reviewed  RAPID STREP SCREEN - Abnormal; Notable for the following:    Streptococcus, Group A Screen (Direct) POSITIVE (*)    All other components within normal limits  URINALYSIS, ROUTINE W REFLEX MICROSCOPIC - Abnormal; Notable for the following:    APPearance CLOUDY (*)    Specific Gravity, Urine 1.004 (*)    All other components within normal limits  PREGNANCY, URINE    Imaging Review No results found.   EKG Interpretation None       3:53 PM Patient seen and examined. Medications ordered.   Vital signs reviewed and are as follows: BP 121/63 mmHg  Pulse 90  Temp(Src) 99.8 F (37.7 C) (Oral)  Resp 18  Ht _0  (1.778 m)  Wt 220 lb (99.791 kg)  BMI 31.57 kg/m2  SpO2 98%  LMP 04/13/2014   Patient counseled on supportive care for strep throat and s/s to return including worsening symptoms, persistent fever, persistent vomiting, or if they have any other concerns. Urged to see PCP if symptoms persist for more than 3 days. Patient verbalizes understanding and agrees with plan.      MDM   Final diagnoses:  Strep throat   Four of four CENTOR criteria met (fever, cervical adenopathy, tonsillar/pharyngeal exudate, no cough).  Strep screen positive. Antibiotics given. Toradol given. No abscess.        Carlisle Cater, PA-C 05/11/14 [place], MD 05/11/14 1843

## 2014-05-11 NOTE — ED Notes (Signed)
Pt with sore throat x 3 days, reports "i can hardly swallow it hurts so bad".

## 2014-06-14 ENCOUNTER — Emergency Department (HOSPITAL_BASED_OUTPATIENT_CLINIC_OR_DEPARTMENT_OTHER)
Admission: EM | Admit: 2014-06-14 | Discharge: 2014-06-14 | Disposition: A | Discharge status: Home / Self Care | Payer: Self-pay | Attending: Emergency Medicine | Admitting: Emergency Medicine

## 2014-06-14 ENCOUNTER — Encounter (HOSPITAL_BASED_OUTPATIENT_CLINIC_OR_DEPARTMENT_OTHER): Payer: Self-pay | Admitting: Family Medicine

## 2014-06-14 DIAGNOSIS — Z3202 Encounter for pregnancy test, result negative: Secondary | ICD-10-CM | POA: Insufficient documentation

## 2014-06-14 DIAGNOSIS — Z792 Long term (current) use of antibiotics: Secondary | ICD-10-CM | POA: Insufficient documentation

## 2014-06-14 DIAGNOSIS — Z91040 Latex allergy status: Secondary | ICD-10-CM | POA: Insufficient documentation

## 2014-06-14 DIAGNOSIS — N390 Urinary tract infection, site not specified: Secondary | ICD-10-CM | POA: Insufficient documentation

## 2014-06-14 DIAGNOSIS — Z791 Long term (current) use of non-steroidal anti-inflammatories (NSAID): Secondary | ICD-10-CM | POA: Insufficient documentation

## 2014-06-14 DIAGNOSIS — Z87891 Personal history of nicotine dependence: Secondary | ICD-10-CM | POA: Insufficient documentation

## 2014-06-14 LAB — URINE MICROSCOPIC-ADD ON

## 2014-06-14 LAB — URINALYSIS, ROUTINE W REFLEX MICROSCOPIC
Bilirubin Urine: NEGATIVE
Glucose, UA: NEGATIVE mg/dL
Ketones, ur: 15 mg/dL — AB
Nitrite: POSITIVE — AB
PH: 5.5 (ref 5.0–8.0)
PROTEIN: NEGATIVE mg/dL
SPECIFIC GRAVITY, URINE: 1.023 (ref 1.005–1.030)
UROBILINOGEN UA: 1 mg/dL (ref 0.0–1.0)

## 2014-06-14 LAB — PREGNANCY, URINE: Preg Test, Ur: NEGATIVE

## 2014-06-14 MED ORDER — CEPHALEXIN 250 MG PO CAPS
500.0000 mg | ORAL_CAPSULE | Freq: Once | ORAL | Status: AC
  Administered 2014-06-14: 500 mg via ORAL
  Filled 2014-06-14: qty 2

## 2014-06-14 MED ORDER — PHENAZOPYRIDINE HCL 200 MG PO TABS: 200.0000 mg | ORAL_TABLET | Freq: Three times a day (TID) | ORAL | Status: DC

## 2014-06-14 MED ORDER — CEPHALEXIN 500 MG PO CAPS: 500.0000 mg | ORAL_CAPSULE | Freq: Three times a day (TID) | ORAL | Status: AC

## 2014-06-14 NOTE — ED Provider Notes (Signed)
CSN: [id number]     Arrival date & time 06/14/14  1842 History  This chart was scribed for Erin Munch, MD by Roxy Cedar, ED Scribe. This patient was seen in room MH01/MH01 and the patient's care was started at 7:45 PM.   Chief Complaint  Patient presents with  . Urinary Frequency   Patient is a 29 y.o. female presenting with frequency. The history is provided by the patient. No language interpreter was used.  Urinary Frequency Associated symptoms include abdominal pain.   HPI Comments: [name] is a 29 y.o. female who presents to the Emergency Department complaining of moderate dysuria, increased urinary frequency and hematuria that began earlier today. She also reports associated suprapubic abdominal pain. Patient states that she had Pyridium medication from her prior diagnosis of UTI 2 years ago. She states she tried taking the medication, but states that she was not sure of when the expiration date was.  Past Medical History  Diagnosis Date  . No pertinent past medical history    Past Surgical History  Procedure Laterality Date  . Pediatric dental surgery    . Induced abortion     No family history on file. History  Substance Use Topics  . Smoking status: Former Smoker -- 0.50 packs/day    Types: Cigarettes  . Smokeless tobacco: Never Used  . Alcohol Use: 0.0 oz/week     Comment: occasional   OB History    Gravida Para Term Preterm AB TAB SAB Ectopic Multiple Living   0 1 0 1 0 0 1     Review of Systems  Constitutional:       Per HPI, otherwise negative  HENT:       Per HPI, otherwise negative  Respiratory:       Per HPI, otherwise negative  Cardiovascular:       Per HPI, otherwise negative  Gastrointestinal: Positive for abdominal pain. Negative for vomiting.  Endocrine:       Negative aside from HPI  Genitourinary: Positive for dysuria, frequency and hematuria.       Neg aside from HPI   Musculoskeletal:       Per HPI, otherwise  negative  Skin: Negative.   Neurological: Negative for syncope.   Allergies  Latex  Home Medications   Prior to Admission medications   Medication Sig Start Date End Date Taking? Authorizing Provider  ciprofloxacin (CIPRO) 500 MG tablet Take 1 tablet (500 mg total) by mouth 2 (two) times daily. For 5 days Patient not taking: Reported on 04/16/2014 12/12/13   Henderson Cloud, MD  metoCLOPramide (REGLAN) 10 MG tablet Take 1 tablet (10 mg total) by mouth every 8 (eight) hours as needed for nausea. 04/17/14   Enid Skeens, MD  naproxen (NAPROSYN) 500 MG tablet Take 1 tablet (500 mg total) by mouth 2 (two) times daily. 05/11/14   Renne Crigler, PA-C  ondansetron (ZOFRAN ODT) 4 MG disintegrating tablet Take 1 tablet (4 mg total) by mouth every 8 (eight) hours as needed for nausea or vomiting. 04/16/14   Abagail Kitchens, MD   Triage Vitals: BP 111/65 mmHg  Pulse 81  Temp(Src) 97.6 F (36.4 C) (Oral)  Resp 16  SpO2 100%  LMP 06/04/2014  Physical Exam  Constitutional: She is oriented to person, place, and time. She appears well-developed and well-nourished. No distress.  HENT:  Head: Normocephalic and atraumatic.  Eyes: Conjunctivae and EOM are normal.  Cardiovascular: Normal rate and regular rhythm.  Pulmonary/Chest: Effort normal and breath sounds normal. No stridor. No respiratory distress.  Abdominal: She exhibits no distension. There is tenderness.  Suprapubic tenderness.  Musculoskeletal: She exhibits no edema.  Neurological: She is alert and oriented to person, place, and time. No cranial nerve deficit.  Skin: Skin is warm and dry.  Psychiatric: She has a normal mood and affect.  Nursing note and vitals reviewed.  ED Course  Procedures (including critical care time)  DIAGNOSTIC STUDIES: Oxygen Saturation is 100% on RA, normal by my interpretation.    COORDINATION OF CARE: 7:47 PM- Discussed plans to order diagnostic urinalysis. Pt advised of plan for treatment and  pt agrees.  Labs Review Labs Reviewed  URINALYSIS, ROUTINE W REFLEX MICROSCOPIC - Abnormal; Notable for the following:    Color, Urine ORANGE (*)    APPearance CLOUDY (*)    Hgb urine dipstick MODERATE (*)    Ketones, ur 15 (*)    Nitrite POSITIVE (*)    Leukocytes, UA MODERATE (*)    All other components within normal limits  URINE MICROSCOPIC-ADD ON - Abnormal; Notable for the following:    Squamous Epithelial / LPF FEW (*)    Bacteria, UA FEW (*)    All other components within normal limits  PREGNANCY, URINE    MDM   Final diagnoses:  UTI (lower urinary tract infection)   patient presents with no distress, but with suprapubic discomfort, dysuria, is found to have urinary tract infection. No clinical evidence for pyelonephritis, bacteremia, sepsis. No evidence for ovarian pathology.   I personally performed the services described in this documentation, which was scribed in my presence. The recorded information has been reviewed and is accurate.  Erin Munchobert Quinterious Walraven, MD 06/14/14 2000

## 2014-06-14 NOTE — ED Notes (Signed)
Pt c/o dysuria, frequency and hematuria since today.

## 2014-07-14 ENCOUNTER — Encounter (HOSPITAL_BASED_OUTPATIENT_CLINIC_OR_DEPARTMENT_OTHER): Payer: Self-pay | Admitting: *Deleted

## 2014-07-14 ENCOUNTER — Emergency Department (HOSPITAL_BASED_OUTPATIENT_CLINIC_OR_DEPARTMENT_OTHER)
Admission: EM | Admit: 2014-07-14 | Discharge: 2014-07-14 | Disposition: A | Discharge status: Home / Self Care | Payer: Self-pay | Attending: Emergency Medicine | Admitting: Emergency Medicine

## 2014-07-14 DIAGNOSIS — Z79899 Other long term (current) drug therapy: Secondary | ICD-10-CM | POA: Insufficient documentation

## 2014-07-14 DIAGNOSIS — Z3202 Encounter for pregnancy test, result negative: Secondary | ICD-10-CM | POA: Insufficient documentation

## 2014-07-14 DIAGNOSIS — Z91040 Latex allergy status: Secondary | ICD-10-CM | POA: Insufficient documentation

## 2014-07-14 DIAGNOSIS — Z791 Long term (current) use of non-steroidal anti-inflammatories (NSAID): Secondary | ICD-10-CM | POA: Insufficient documentation

## 2014-07-14 DIAGNOSIS — N3000 Acute cystitis without hematuria: Secondary | ICD-10-CM | POA: Insufficient documentation

## 2014-07-14 DIAGNOSIS — Z87891 Personal history of nicotine dependence: Secondary | ICD-10-CM | POA: Insufficient documentation

## 2014-07-14 LAB — URINALYSIS, ROUTINE W REFLEX MICROSCOPIC
BILIRUBIN URINE: NEGATIVE
GLUCOSE, UA: NEGATIVE mg/dL
Hgb urine dipstick: NEGATIVE
KETONES UR: NEGATIVE mg/dL
NITRITE: POSITIVE — AB
PH: 5.5 (ref 5.0–8.0)
PROTEIN: NEGATIVE mg/dL
SPECIFIC GRAVITY, URINE: 1.019 (ref 1.005–1.030)
Urobilinogen, UA: 1 mg/dL (ref 0.0–1.0)

## 2014-07-14 LAB — URINE MICROSCOPIC-ADD ON

## 2014-07-14 LAB — PREGNANCY, URINE: PREG TEST UR: NEGATIVE

## 2014-07-14 MED ORDER — CIPROFLOXACIN HCL 500 MG PO TABS: 500.0000 mg | ORAL_TABLET | Freq: Two times a day (BID) | ORAL | Status: DC

## 2014-07-14 NOTE — ED Provider Notes (Signed)
CSN: [id number]     Arrival date & time 07/14/14  0711 History   First MD Initiated Contact with Patient 07/14/14 [phone]     Chief Complaint  Patient presents with  . Urinary Frequency     (Consider location/radiation/quality/duration/timing/severity/associated sxs/prior Treatment) HPI Comments: Patient is [age] year old female who presents with urinary frequency and dysuria starting 2 days ago. She was recently treated for a UTI the beginning of January. She was prescribed Keflex and her symptoms cleared up. They're now returning. She has taken leftover Pyridium with some relief of her discomfort. Her last menstrual period was 2 weeks ago and denies the possibility of pregnancy.  Patient is a 29 y.o. female presenting with frequency. The history is provided by the patient.  Urinary Frequency This is a recurrent problem. The current episode started 2 days ago. The problem occurs constantly. The problem has been gradually worsening. Pertinent negatives include no chest pain and no abdominal pain. Exacerbated by: Urination. Relieved by: Pyridium. Treatments tried: Pyridium. The treatment provided mild relief.    Past Medical History  Diagnosis Date  . No pertinent past medical history    Past Surgical History  Procedure Laterality Date  . Pediatric dental surgery    . Induced abortion     No family history on file. History  Substance Use Topics  . Smoking status: Former Smoker -- 0.50 packs/day    Types: Cigarettes  . Smokeless tobacco: Never Used  . Alcohol Use: 0.0 oz/week     Comment: occasional   OB History    Gravida Para Term Preterm AB TAB SAB Ectopic Multiple Living   4 1 1  0 1 0 1 0 0 1     Review of Systems  Cardiovascular: Negative for chest pain.  Gastrointestinal: Negative for abdominal pain.  Genitourinary: Positive for frequency.  All other systems reviewed and are negative.     Allergies  Latex  Home Medications   Prior to Admission medications    Medication Sig Start Date End Date Taking? Authorizing Provider  metoCLOPramide (REGLAN) 10 MG tablet Take 1 tablet (10 mg total) by mouth every 8 (eight) hours as needed for nausea. 04/17/14   Enid SkeensJoshua M Zavitz, MD  naproxen (NAPROSYN) 500 MG tablet Take 1 tablet (500 mg total) by mouth 2 (two) times daily. 05/11/14   Renne CriglerJoshua Geiple, PA-C  ondansetron (ZOFRAN ODT) 4 MG disintegrating tablet Take 1 tablet (4 mg total) by mouth every 8 (eight) hours as needed for nausea or vomiting. 04/16/14   Abagail KitchensMegan Taylor, MD  phenazopyridine (PYRIDIUM) 200 MG tablet Take 1 tablet (200 mg total) by mouth 3 (three) times daily. 06/14/14   Gerhard Munchobert Lockwood, MD   BP 127/62 mmHg  Pulse 82  Temp(Src) 98.3 F (36.8 C) (Oral)  Resp 18  Ht 5\' 10"  (1.778 m)  Wt 229 lb (103.874 kg)  BMI 32.86 kg/m2  SpO2 100%  LMP 06/04/2014 Physical Exam  Constitutional: She is oriented to person, place, and time. She appears well-developed and well-nourished. No distress.  HENT:  Head: Normocephalic and atraumatic.  Neck: Normal range of motion. Neck supple.  Cardiovascular: Normal rate and regular rhythm.  Exam reveals no gallop and no friction rub.   No murmur heard. Pulmonary/Chest: Effort normal and breath sounds normal. No respiratory distress. She has no wheezes.  Abdominal: Soft. Bowel sounds are normal. She exhibits no distension. There is no tenderness.  Musculoskeletal: Normal range of motion.  Neurological: She is alert and oriented to person, place, and time.  Skin: Skin is warm and dry. She is not diaphoretic.  Nursing note and vitals reviewed.   ED Course  Procedures (including critical care time) Labs Review Labs Reviewed  URINALYSIS, ROUTINE W REFLEX MICROSCOPIC  PREGNANCY, URINE    Imaging Review No results found.   EKG Interpretation None      MDM   Final diagnoses:  None    Patient is a 29 year old female who presents with urinary frequency. She was recently treated for a UTI. Urinalysis  today reveals a recurrent urinary tract infection and she was prescribed Keflex last time. This time she will be prescribed Cipro. She is to return as needed for any problems. She inquired whether the urine tested for STDs, explained to her that this would require a pelvic exam which she has politely declined.    Geoffery Lyons, MD 07/14/14 [phone]

## 2014-07-14 NOTE — ED Notes (Signed)
Pt states she is having urinary frequency and burning with urination

## 2014-07-14 NOTE — Discharge Instructions (Signed)
Cipro as prescribed.  Pyridium from prior prescription as needed for burning and frequency.  Return to the emergency department if you develop high fever, worsening pain, or other new and concerning symptoms.   Urinary Tract Infection Urinary tract infections (UTIs) can develop anywhere along your urinary tract. Your urinary tract is your body's drainage system for removing wastes and extra water. Your urinary tract includes two kidneys, two ureters, a bladder, and a urethra. Your kidneys are a pair of bean-shaped organs. Each kidney is about the size of your fist. They are located below your ribs, one on each side of your spine. CAUSES Infections are caused by microbes, which are microscopic organisms, including fungi, viruses, and bacteria. These organisms are so small that they can only be seen through a microscope. Bacteria are the microbes that most commonly cause UTIs. SYMPTOMS  Symptoms of UTIs may vary by age and gender of the patient and by the location of the infection. Symptoms in young women typically include a frequent and intense urge to urinate and a painful, burning feeling in the bladder or urethra during urination. Older women and men are more likely to be tired, shaky, and weak and have muscle aches and abdominal pain. A fever may mean the infection is in your kidneys. Other symptoms of a kidney infection include pain in your back or sides below the ribs, nausea, and vomiting. DIAGNOSIS To diagnose a UTI, your caregiver will ask you about your symptoms. Your caregiver also will ask to provide a urine sample. The urine sample will be tested for bacteria and white blood cells. White blood cells are made by your body to help fight infection. TREATMENT  Typically, UTIs can be treated with medication. Because most UTIs are caused by a bacterial infection, they usually can be treated with the use of antibiotics. The choice of antibiotic and length of treatment depend on your symptoms and  the type of bacteria causing your infection. HOME CARE INSTRUCTIONS  If you were prescribed antibiotics, take them exactly as your caregiver instructs you. Finish the medication even if you feel better after you have only taken some of the medication.  Drink enough water and fluids to keep your urine clear or pale yellow.  Avoid caffeine, tea, and carbonated beverages. They tend to irritate your bladder.  Empty your bladder often. Avoid holding urine for long periods of time.  Empty your bladder before and after sexual intercourse.  After a bowel movement, women should cleanse from front to back. Use each tissue only once. SEEK MEDICAL CARE IF:   You have back pain.  You develop a fever.  Your symptoms do not begin to resolve within 3 days. SEEK IMMEDIATE MEDICAL CARE IF:   You have severe back pain or lower abdominal pain.  You develop chills.  You have nausea or vomiting.  You have continued burning or discomfort with urination. MAKE SURE YOU:   Understand these instructions.  Will watch your condition.  Will get help right away if you are not doing well or get worse. Document Released: 03/04/2005 Document Revised: 11/24/2011 Document Reviewed: 07/03/2011 [facility] Patient Information 2015 [place], [place]. This information is not intended to replace advice given to you by your health care provider. Make sure you discuss any questions you have with your health care provider.

## 2014-07-30 ENCOUNTER — Inpatient Hospital Stay (HOSPITAL_COMMUNITY)
Admission: AD | Admit: 2014-07-30 | Discharge: 2014-07-30 | Disposition: A | Discharge status: Home / Self Care | Payer: Self-pay | Source: Ambulatory Visit | Attending: Obstetrics & Gynecology | Admitting: Obstetrics & Gynecology

## 2014-07-30 ENCOUNTER — Encounter (HOSPITAL_COMMUNITY): Payer: Self-pay | Admitting: *Deleted

## 2014-07-30 DIAGNOSIS — O219 Vomiting of pregnancy, unspecified: Secondary | ICD-10-CM

## 2014-07-30 DIAGNOSIS — Z3A01 Less than 8 weeks gestation of pregnancy: Secondary | ICD-10-CM | POA: Insufficient documentation

## 2014-07-30 DIAGNOSIS — O210 Mild hyperemesis gravidarum: Secondary | ICD-10-CM | POA: Insufficient documentation

## 2014-07-30 LAB — URINALYSIS, ROUTINE W REFLEX MICROSCOPIC
Bilirubin Urine: NEGATIVE
Glucose, UA: NEGATIVE mg/dL
Hgb urine dipstick: NEGATIVE
KETONES UR: NEGATIVE mg/dL
LEUKOCYTES UA: NEGATIVE
Nitrite: NEGATIVE
Protein, ur: NEGATIVE mg/dL
SPECIFIC GRAVITY, URINE: 1.025 (ref 1.005–1.030)
Urobilinogen, UA: 0.2 mg/dL (ref 0.0–1.0)
pH: 6 (ref 5.0–8.0)

## 2014-07-30 MED ORDER — PROMETHAZINE HCL 25 MG PO TABS: 12.5000 mg | ORAL_TABLET | Freq: Four times a day (QID) | ORAL | Status: DC | PRN

## 2014-07-30 NOTE — Discharge Instructions (Signed)

## 2014-07-30 NOTE — MAU Provider Note (Signed)
Subjective:  Erin Boyd is a 29 y.o. female G5P1011 at 6158w4d who presents for a pregnancy test and nausea and vomiting.  The nausea and vomiting have been worse over the last 3 days. She is able to keep some foods and liquids down. She denies diarrhea today.  Denies vaginal bleeding  Denies abdominal pain.   RN note:  Pt C/O vomiting every night for the last 3 nights, denies diarrhea. Unsure if pregnant. Denies pain.  Objective:  GENERAL: Well-developed, well-nourished female in no acute distress.  HEENT: Normocephalic, atraumatic.   LUNGS: Effort normal HEART: Regular rate  SKIN: Warm, dry and without erythema PSYCH: Normal mood and affect  Filed Vitals:   07/30/14 1122  BP: 145/70  Pulse: 90  Temp: 99.2 F (37.3 C)  Resp: 18    Results for orders placed or performed during the hospital encounter of 07/30/14 (from the past 48 hour(s))  Urinalysis, Routine w reflex microscopic     Status: None   Collection Time: 07/30/14 11:25 AM  Result Value Ref Range   Color, Urine YELLOW YELLOW   APPearance CLEAR CLEAR   Specific Gravity, Urine 1.025 1.005 - 1.030   pH 6.0 5.0 - 8.0   Glucose, UA NEGATIVE NEGATIVE mg/dL   Hgb urine dipstick NEGATIVE NEGATIVE   Bilirubin Urine NEGATIVE NEGATIVE   Ketones, ur NEGATIVE NEGATIVE mg/dL   Protein, ur NEGATIVE NEGATIVE mg/dL   Urobilinogen, UA 0.2 0.0 - 1.0 mg/dL   Nitrite NEGATIVE NEGATIVE   Leukocytes, UA NEGATIVE NEGATIVE    Comment: MICROSCOPIC NOT DONE ON URINES WITH NEGATIVE PROTEIN, BLOOD, LEUKOCYTES, NITRITE, OR GLUCOSE <1000 mg/dL.   MDM Urine pregnancy test positive.     Assessment:  1. Nausea and vomiting during pregnancy     Plan:  Discharge home in stable condition RX: phenergan  A list of safe medications given to the patient Return to MAU if symptoms worsen Pregnancy verification letter given.  Iona HansenJennifer Irene Rasch, NP 07/30/2014 12:57 PM

## 2014-07-30 NOTE — MAU Note (Signed)
UPT is positive.

## 2014-07-30 NOTE — MAU Note (Signed)
Pt C/O vomiting every night for the last 3 nights, denies diarrhea.  Unsure if pregnant.  Denies pain.

## 2014-10-25 ENCOUNTER — Encounter (HOSPITAL_BASED_OUTPATIENT_CLINIC_OR_DEPARTMENT_OTHER): Payer: Self-pay | Admitting: *Deleted

## 2014-10-25 ENCOUNTER — Emergency Department (HOSPITAL_BASED_OUTPATIENT_CLINIC_OR_DEPARTMENT_OTHER)
Admission: EM | Admit: 2014-10-25 | Discharge: 2014-10-25 | Disposition: A | Discharge status: Home / Self Care | Payer: Self-pay | Attending: Emergency Medicine | Admitting: Emergency Medicine

## 2014-10-25 DIAGNOSIS — Z91040 Latex allergy status: Secondary | ICD-10-CM | POA: Insufficient documentation

## 2014-10-25 DIAGNOSIS — J020 Streptococcal pharyngitis: Secondary | ICD-10-CM | POA: Insufficient documentation

## 2014-10-25 DIAGNOSIS — Z87891 Personal history of nicotine dependence: Secondary | ICD-10-CM | POA: Insufficient documentation

## 2014-10-25 LAB — RAPID STREP SCREEN (MED CTR MEBANE ONLY): Streptococcus, Group A Screen (Direct): POSITIVE — AB

## 2014-10-25 MED ORDER — PENICILLIN G BENZATHINE 1200000 UNIT/2ML IM SUSP
1.2000 10*6.[IU] | Freq: Once | INTRAMUSCULAR | Status: AC
  Administered 2014-10-25: 1.2 10*6.[IU] via INTRAMUSCULAR
  Filled 2014-10-25: qty 2

## 2014-10-25 NOTE — Discharge Instructions (Signed)

## 2014-10-25 NOTE — ED Notes (Signed)
Sore throat this am 

## 2014-10-25 NOTE — ED Provider Notes (Signed)
CSN: [id number]     Arrival date & time 10/25/14  1614 History   First MD Initiated Contact with Patient 10/25/14 1619     Chief Complaint  Patient presents with  . Sore Throat     (Consider location/radiation/quality/duration/timing/severity/associated sxs/prior Treatment) Patient is a 29 y.o. female presenting with pharyngitis. No language interpreter was used.  Sore Throat This is a new problem. The current episode started today. The problem occurs constantly. The problem has been unchanged. Associated symptoms include a sore throat. Pertinent negatives include no fever, myalgias, neck pain or rash. The symptoms are aggravated by swallowing. She has tried nothing for the symptoms.    Past Medical History  Diagnosis Date  . No pertinent past medical history    Past Surgical History  Procedure Laterality Date  . Pediatric dental surgery    . Induced abortion     No family history on file. History  Substance Use Topics  . Smoking status: Former Smoker -- 0.50 packs/day    Types: Cigarettes  . Smokeless tobacco: Never Used  . Alcohol Use: 0.0 oz/week     Comment: occasional   OB History    Gravida Para Term Preterm AB TAB SAB Ectopic Multiple Living   5 1 1  0 1 0 1 0 0 1     Review of Systems  Constitutional: Negative for fever.  HENT: Positive for sore throat.   Musculoskeletal: Negative for myalgias and neck pain.  Skin: Negative for rash.  All other systems reviewed and are negative.     Allergies  Latex  Home Medications   Prior to Admission medications   Medication Sig Start Date End Date Taking? Authorizing Provider  promethazine (PHENERGAN) 25 MG tablet Take 0.5 tablets (12.5 mg total) by mouth every 6 (six) hours as needed for nausea or vomiting. 07/30/14   Duane LopeJennifer I Rasch, NP   BP 129/67 mmHg  Pulse 86  Temp(Src) 98.8 F (37.1 C) (Oral)  Resp 18  Ht 5\' 11"  (1.803 m)  Wt 210 lb (95.255 kg)  BMI 29.30 kg/m2  SpO2 100%  LMP 10/13/2014   Breastfeeding? Unknown Physical Exam  Constitutional: She is oriented to person, place, and time. She appears well-developed and well-nourished.  HENT:  Right Ear: External ear normal.  Left Ear: External ear normal.  Mouth/Throat: Posterior oropharyngeal edema and posterior oropharyngeal erythema present.  Eyes: Conjunctivae and EOM are normal.  Neck: Normal range of motion. Neck supple.  Cardiovascular: Normal rate and regular rhythm.   Pulmonary/Chest: Effort normal and breath sounds normal.  Abdominal: Soft. Bowel sounds are normal. There is no tenderness.  Neurological: She is alert and oriented to person, place, and time.  Nursing note and vitals reviewed.   ED Course  Procedures (including critical care time) Labs Review Labs Reviewed  RAPID STREP SCREEN - Abnormal; Notable for the following:    Streptococcus, Group A Screen (Direct) POSITIVE (*)    All other components within normal limits    Imaging Review No results found.   EKG Interpretation None      MDM   Final diagnoses:  Strep pharyngitis    Strep positive. Pt treated with bicillin here. Dicussed return precautions. No sign of pta at this time    Teressa LowerVrinda Sharlynn Seckinger, NP 10/25/14 1657  Vanetta MuldersScott Zackowski, MD 10/26/14 [phone]

## 2015-01-01 ENCOUNTER — Encounter (HOSPITAL_BASED_OUTPATIENT_CLINIC_OR_DEPARTMENT_OTHER): Payer: Self-pay | Admitting: *Deleted

## 2015-01-01 ENCOUNTER — Emergency Department (HOSPITAL_BASED_OUTPATIENT_CLINIC_OR_DEPARTMENT_OTHER)
Admission: EM | Admit: 2015-01-01 | Discharge: 2015-01-01 | Disposition: A | Discharge status: Home / Self Care | Payer: Self-pay | Attending: Emergency Medicine | Admitting: Emergency Medicine

## 2015-01-01 DIAGNOSIS — Z3490 Encounter for supervision of normal pregnancy, unspecified, unspecified trimester: Secondary | ICD-10-CM

## 2015-01-01 DIAGNOSIS — Z87891 Personal history of nicotine dependence: Secondary | ICD-10-CM | POA: Insufficient documentation

## 2015-01-01 DIAGNOSIS — Z3A01 Less than 8 weeks gestation of pregnancy: Secondary | ICD-10-CM | POA: Insufficient documentation

## 2015-01-01 DIAGNOSIS — O2341 Unspecified infection of urinary tract in pregnancy, first trimester: Secondary | ICD-10-CM | POA: Insufficient documentation

## 2015-01-01 DIAGNOSIS — N390 Urinary tract infection, site not specified: Secondary | ICD-10-CM

## 2015-01-01 DIAGNOSIS — Z91040 Latex allergy status: Secondary | ICD-10-CM | POA: Insufficient documentation

## 2015-01-01 LAB — URINE MICROSCOPIC-ADD ON

## 2015-01-01 LAB — URINALYSIS, ROUTINE W REFLEX MICROSCOPIC
Bilirubin Urine: NEGATIVE
Glucose, UA: NEGATIVE mg/dL
HGB URINE DIPSTICK: NEGATIVE
Ketones, ur: NEGATIVE mg/dL
Nitrite: NEGATIVE
Protein, ur: NEGATIVE mg/dL
Specific Gravity, Urine: 1.01 (ref 1.005–1.030)
Urobilinogen, UA: 0.2 mg/dL (ref 0.0–1.0)
pH: 5.5 (ref 5.0–8.0)

## 2015-01-01 LAB — PREGNANCY, URINE: PREG TEST UR: POSITIVE — AB

## 2015-01-01 MED ORDER — CEPHALEXIN 500 MG PO CAPS: 500.0000 mg | ORAL_CAPSULE | Freq: Three times a day (TID) | ORAL | Status: DC

## 2015-01-01 MED ORDER — CEPHALEXIN 250 MG PO CAPS
500.0000 mg | ORAL_CAPSULE | Freq: Once | ORAL | Status: AC
  Administered 2015-01-01: 500 mg via ORAL
  Filled 2015-01-01: qty 2

## 2015-01-01 NOTE — ED Provider Notes (Signed)
CSN: [id number]     Arrival date & time 01/01/15  1905 History  This chart was scribed for Rolland Porter, MD by Placido Sou, ED scribe. This patient was seen in room MH06/MH06 and the patient's care was started at 8:41 PM.   Chief Complaint  Patient presents with  . Dysuria   The history is provided by the patient. No language interpreter was used.    HPI Comments: Erin Boyd is a 29 y.o. female who presents to the Emergency Department complaining of constant, mild, dysuria with onset 2 days ago. She notes that since the onset of her symptoms she has noticed some mild spotting s/p urinating, mild, lower abd pain, 1x episode nausea s/p eating yesterday and further notes associated increased frequency of her urination. She notes taking Azo and experiencing a mild relief of her dysuria. She notes her LNMP was mid-June and further notes having 2 episodes of a menstrual cycle in June with each lasting 6 days and denies having a menstrual cycle in July. Pt notes a history of irregular menstrual cycles. She notes another pregnancy in May ending with an induced abortion. Pt denies fever, hematuria or vaginal bleeding.   Past Medical History  Diagnosis Date  . No pertinent past medical history    Past Surgical History  Procedure Laterality Date  . Pediatric dental surgery    . Induced abortion     History reviewed. No pertinent family history. History  Substance Use Topics  . Smoking status: Former Smoker -- 0.50 packs/day    Types: Cigarettes  . Smokeless tobacco: Never Used  . Alcohol Use: 0.0 oz/week     Comment: occasional   OB History    Gravida Para Term Preterm AB TAB SAB Ectopic Multiple Living   5 1 1  0 1 0 1 0 0 1     Review of Systems  Constitutional: Negative for fever, chills, diaphoresis, appetite change and fatigue.  HENT: Negative for mouth sores, sore throat and trouble swallowing.   Eyes: Negative for visual disturbance.  Respiratory: Negative for cough,  chest tightness, shortness of breath and wheezing.   Cardiovascular: Negative for chest pain.  Gastrointestinal: Positive for nausea and abdominal pain. Negative for vomiting, diarrhea and abdominal distention.  Endocrine: Negative for polydipsia, polyphagia and polyuria.  Genitourinary: Positive for dysuria and frequency. Negative for hematuria and vaginal bleeding.  Musculoskeletal: Negative for gait problem.  Skin: Negative for color change, pallor and rash.  Neurological: Negative for dizziness, syncope, light-headedness and headaches.  Hematological: Does not bruise/bleed easily.  Psychiatric/Behavioral: Negative for behavioral problems and confusion.   Allergies  Latex  Home Medications   Prior to Admission medications   Medication Sig Start Date End Date Taking? Authorizing Provider  Multiple Vitamin (MULTIVITAMIN) tablet Take 1 tablet by mouth daily.   Yes Historical Provider, MD  cephALEXin (KEFLEX) 500 MG capsule Take 1 capsule (500 mg total) by mouth 3 (three) times daily. 01/01/15   Rolland Porter, MD  promethazine (PHENERGAN) 25 MG tablet Take 0.5 tablets (12.5 mg total) by mouth every 6 (six) hours as needed for nausea or vomiting. 07/30/14   Harolyn Rutherford Rasch, NP   BP 124/65 mmHg  Pulse 80  Temp(Src) 97.3 F (36.3 C) (Oral)  Resp 16  Ht 5\' 11"  (1.803 m)  Wt 229 lb 14.4 oz (104.282 kg)  BMI 32.08 kg/m2  SpO2 99%  LMP 11/22/2014 (Exact Date) Physical Exam  Constitutional: She is oriented to person, place, and time. She appears  well-developed and well-nourished. No distress.  HENT:  Head: Normocephalic.  Eyes: Conjunctivae are normal. Pupils are equal, round, and reactive to light. No scleral icterus.  Neck: Normal range of motion. Neck supple. No thyromegaly present.  Cardiovascular: Normal rate and regular rhythm.  Exam reveals no gallop and no friction rub.   No murmur heard. Pulmonary/Chest: Effort normal and breath sounds normal. No respiratory distress. She has no  wheezes. She has no rales.  Abdominal: Soft. Bowel sounds are normal. She exhibits no distension. There is no tenderness. There is no rebound.  Musculoskeletal: Normal range of motion.  Neurological: She is alert and oriented to person, place, and time.  Skin: Skin is warm and dry. No rash noted.  Psychiatric: She has a normal mood and affect. Her behavior is normal.    ED Course  Procedures  DIAGNOSTIC STUDIES: Oxygen Saturation is 99% on RA, normal by my interpretation.    COORDINATION OF CARE: 8:46 PM Discussed treatment plan with pt at bedside and pt agreed to plan.  Labs Review Labs Reviewed  URINALYSIS, ROUTINE W REFLEX MICROSCOPIC (NOT AT [facility]) - Abnormal; Notable for the following:    Leukocytes, UA SMALL (*)    All other components within normal limits  PREGNANCY, URINE - Abnormal; Notable for the following:    Preg Test, Ur POSITIVE (*)    All other components within normal limits  URINE CULTURE  URINE MICROSCOPIC-ADD ON    Imaging Review No results found.   EKG Interpretation None      MDM   Final diagnoses:  UTI (lower urinary tract infection)  Pregnancy    I personally performed the services described in this documentation, which was scribed in my presence. The recorded information has been reviewed and is accurate.   Rolland Porter, MD 01/02/15 0000

## 2015-01-01 NOTE — Discharge Instructions (Signed)

## 2015-01-01 NOTE — ED Notes (Signed)
Pt reports dysuria that began on Sunday, pt started taking OTC azo w/ some relief - pt concerned she may have UTI. Denies fever or hematuria. Pt in no acute distress.

## 2015-01-03 ENCOUNTER — Encounter (HOSPITAL_COMMUNITY): Payer: Self-pay | Admitting: *Deleted

## 2015-01-03 ENCOUNTER — Inpatient Hospital Stay (HOSPITAL_COMMUNITY): Payer: Medicaid Other

## 2015-01-03 ENCOUNTER — Inpatient Hospital Stay (HOSPITAL_COMMUNITY)
Admission: AD | Admit: 2015-01-03 | Discharge: 2015-01-03 | Disposition: A | Discharge status: Home / Self Care | Payer: Medicaid Other | Source: Ambulatory Visit | Attending: Obstetrics & Gynecology | Admitting: Obstetrics & Gynecology

## 2015-01-03 DIAGNOSIS — R102 Pelvic and perineal pain: Secondary | ICD-10-CM | POA: Diagnosis not present

## 2015-01-03 DIAGNOSIS — Z87891 Personal history of nicotine dependence: Secondary | ICD-10-CM | POA: Insufficient documentation

## 2015-01-03 DIAGNOSIS — O9989 Other specified diseases and conditions complicating pregnancy, childbirth and the puerperium: Secondary | ICD-10-CM | POA: Diagnosis not present

## 2015-01-03 DIAGNOSIS — O26891 Other specified pregnancy related conditions, first trimester: Secondary | ICD-10-CM

## 2015-01-03 DIAGNOSIS — Z3A01 Less than 8 weeks gestation of pregnancy: Secondary | ICD-10-CM | POA: Diagnosis not present

## 2015-01-03 DIAGNOSIS — N949 Unspecified condition associated with female genital organs and menstrual cycle: Secondary | ICD-10-CM

## 2015-01-03 DIAGNOSIS — R1032 Left lower quadrant pain: Secondary | ICD-10-CM | POA: Diagnosis present

## 2015-01-03 DIAGNOSIS — O209 Hemorrhage in early pregnancy, unspecified: Secondary | ICD-10-CM

## 2015-01-03 LAB — CBC
HCT: 39.1 % (ref 36.0–46.0)
Hemoglobin: 13.3 g/dL (ref 12.0–15.0)
MCH: 29.8 pg (ref 26.0–34.0)
MCHC: 34 g/dL (ref 30.0–36.0)
MCV: 87.5 fL (ref 78.0–100.0)
Platelets: 149 10*3/uL — ABNORMAL LOW (ref 150–400)
RBC: 4.47 MIL/uL (ref 3.87–5.11)
RDW: 14.4 % (ref 11.5–15.5)
WBC: 6.3 10*3/uL (ref 4.0–10.5)

## 2015-01-03 LAB — URINALYSIS, ROUTINE W REFLEX MICROSCOPIC
Bilirubin Urine: NEGATIVE
GLUCOSE, UA: NEGATIVE mg/dL
Hgb urine dipstick: NEGATIVE
Ketones, ur: NEGATIVE mg/dL
Leukocytes, UA: NEGATIVE
Nitrite: NEGATIVE
PROTEIN: NEGATIVE mg/dL
Specific Gravity, Urine: 1.025 (ref 1.005–1.030)
UROBILINOGEN UA: 0.2 mg/dL (ref 0.0–1.0)
pH: 5.5 (ref 5.0–8.0)

## 2015-01-03 LAB — WET PREP, GENITAL
Clue Cells Wet Prep HPF POC: NONE SEEN
Trich, Wet Prep: NONE SEEN
Yeast Wet Prep HPF POC: NONE SEEN

## 2015-01-03 LAB — HCG, QUANTITATIVE, PREGNANCY: hCG, Beta Chain, Quant, S: 104 m[IU]/mL — ABNORMAL HIGH (ref <5)

## 2015-01-03 NOTE — MAU Provider Note (Signed)
History     CSN: [id number]  Arrival date and time: 01/03/15 1116   None     Chief Complaint  Patient presents with  . Pelvic Pain   HPI  [name] is a 29 y.o. G5P1031 at [redacted]w[redacted]d by approximate LMP here with report of lower pelvic pressure and lower left quadrant pain. Also reported burning with urination and was seen in urgent care.  Diagnosed with both pregnancy and a UTI and prescribed antibiotics. Reports seeing pinkish discharge when wiping today.  "Feels like period is about to start". Also reports nausea and vomiting x 3 days.  Vomited x 3 in past 24 hours.    Past Medical History  Diagnosis Date  . No pertinent past medical history     Past Surgical History  Procedure Laterality Date  . Pediatric dental surgery    . Induced abortion      No family history on file.  History  Substance Use Topics  . Smoking status: Former Smoker -- 0.50 packs/day    Types: Cigarettes  . Smokeless tobacco: Never Used  . Alcohol Use: 0.0 oz/week     Comment: occasional    Allergies:  Allergies  Allergen Reactions  . Latex Rash    Prescriptions prior to admission  Medication Sig Dispense Refill Last Dose  . cephALEXin (KEFLEX) 500 MG capsule Take 1 capsule (500 mg total) by mouth 3 (three) times daily. 9 capsule 0   . Multiple Vitamin (MULTIVITAMIN) tablet Take 1 tablet by mouth daily.     . promethazine (PHENERGAN) 25 MG tablet Take 0.5 tablets (12.5 mg total) by mouth every 6 (six) hours as needed for nausea or vomiting. 30 tablet 0   No family history on file.  Review of Systems  Constitutional: Negative for fever and chills.  Gastrointestinal: Positive for nausea, vomiting and abdominal pain.  Genitourinary: Positive for hematuria. Negative for dysuria and frequency.       Pelvic pressure  All other systems reviewed and are negative.  Physical Exam   Blood pressure 129/70, pulse 85, temperature 98.8 F (37.1 C), resp. rate 16, height 5' 9.5"  (1.765 m), weight 102.513 kg (226 lb), last menstrual period 11/22/2014, unknown if currently breastfeeding.  Physical Exam  Constitutional: She is oriented to person, place, and time. She appears well-developed and well-nourished. No distress.  HENT:  Head: Normocephalic.  Neck: Normal range of motion. Neck supple.  Cardiovascular: Normal rate, regular rhythm and normal heart sounds.   Respiratory: Effort normal and breath sounds normal. No respiratory distress.  GI: Soft.  Genitourinary: Right adnexum displays no mass, no tenderness and no fullness. Left adnexum displays no mass, no tenderness and no fullness. No bleeding in the vagina. Vaginal discharge (thick white discharge) found.  Musculoskeletal: Normal range of motion.  Neurological: She is alert and oriented to person, place, and time.  Skin: Skin is warm and dry.    MAU Course  Procedures  Results for orders placed or performed during the hospital encounter of 01/03/15 (from the past 24 hour(s))  Urinalysis, Routine w reflex microscopic (not at [facility])     Status: None   Collection Time: 01/03/15 11:38 AM  Result Value Ref Range   Color, Urine YELLOW YELLOW   APPearance CLEAR CLEAR   Specific Gravity, Urine 1.025 1.005 - 1.030   pH 5.5 5.0 - 8.0   Glucose, UA NEGATIVE NEGATIVE mg/dL   Hgb urine dipstick NEGATIVE NEGATIVE   Bilirubin Urine NEGATIVE NEGATIVE  Ketones, ur NEGATIVE NEGATIVE mg/dL   Protein, ur NEGATIVE NEGATIVE mg/dL   Urobilinogen, UA 0.2 0.0 - 1.0 mg/dL   Nitrite NEGATIVE NEGATIVE   Leukocytes, UA NEGATIVE NEGATIVE  CBC     Status: Abnormal   Collection Time: 01/03/15 11:55 AM  Result Value Ref Range   WBC 6.3 4.0 - 10.5 K/uL   RBC 4.47 3.87 - 5.11 MIL/uL   Hemoglobin 13.3 12.0 - 15.0 g/dL   HCT 16.1 09.6 - 04.5 %   MCV 87.5 78.0 - 100.0 fL   MCH 29.8 26.0 - 34.0 pg   MCHC 34.0 30.0 - 36.0 g/dL   RDW 40.9 81.1 - 91.4 %   Platelets 149 (L) 150 - 400 K/uL  hCG, quantitative, pregnancy     Status:  Abnormal   Collection Time: 01/03/15 11:55 AM  Result Value Ref Range   hCG, Beta Chain, Quant, S 104 (H) <5 mIU/mL  Wet prep, genital     Status: Abnormal   Collection Time: 01/03/15  1:40 PM  Result Value Ref Range   Yeast Wet Prep HPF POC NONE SEEN NONE SEEN   Trich, Wet Prep NONE SEEN NONE SEEN   Clue Cells Wet Prep HPF POC NONE SEEN NONE SEEN   WBC, Wet Prep HPF POC MODERATE (A) NONE SEEN   Ultrasound: IMPRESSION: Trace amount of free fluid in endometrial cavity, however no definite intrauterine gestational sac visualized.  4.6 x 2.2 cm right adnexal mass immediately adjacent to right ovary. Ectopic pregnancy cannot be excluded. No evidence of free fluid.  Critical Value/emergent results were called by telephone at the time of interpretation on 01/03/2015 at 3:05 pm to patient's MAU practicioner [id number], who verbally acknowledged these results.  1700 Consulted with Dr. Erin Fulling > reviewed HPI/exam/labs > strict ectopic precautions, return in 48 hours for repeat BHCG Assessment and Plan  Pelvic Pain in Pregnancy Right Adnexal Mass - r/o Ectopic  Plan: Discharge to home Strict ectopic precautions Return in 48 hours for repeat BHCG  KARIM, [facility] N 01/03/2015, 5:13 PM

## 2015-01-03 NOTE — MAU Note (Signed)
Was out of town over the weekend.  Had burning with urination- went to UC, was told preg and put on antibiotics.  Saw pinkish in urine today, feeling heaviness in lower abd- feels like periiod is going to come on

## 2015-01-03 NOTE — Progress Notes (Signed)
Patient presented to nurses desk stating she was going to leave, contacted W. Elenora Fender CNM to come and discuss with the patient the need to stay until she consulted with Dr. Erin Fulling.

## 2015-01-04 LAB — GC/CHLAMYDIA PROBE AMP (~~LOC~~) NOT AT ARMC
Chlamydia: NEGATIVE
Neisseria Gonorrhea: NEGATIVE

## 2015-01-04 LAB — URINE CULTURE

## 2015-01-04 LAB — HIV ANTIBODY (ROUTINE TESTING W REFLEX): HIV Screen 4th Generation wRfx: NONREACTIVE

## 2015-02-04 ENCOUNTER — Other Ambulatory Visit (HOSPITAL_COMMUNITY): Payer: Self-pay | Admitting: Nurse Practitioner

## 2015-02-04 ENCOUNTER — Telehealth: Payer: Self-pay | Admitting: Family Medicine

## 2015-02-04 ENCOUNTER — Ambulatory Visit (HOSPITAL_COMMUNITY)
Admission: RE | Admit: 2015-02-04 | Discharge: 2015-02-04 | Disposition: A | Discharge status: Home / Self Care | Payer: Medicaid Other | Source: Ambulatory Visit | Attending: Nurse Practitioner | Admitting: Nurse Practitioner

## 2015-02-04 DIAGNOSIS — O3680X1 Pregnancy with inconclusive fetal viability, fetus 1: Secondary | ICD-10-CM

## 2015-02-04 DIAGNOSIS — Z36 Encounter for antenatal screening of mother: Secondary | ICD-10-CM | POA: Diagnosis not present

## 2015-02-04 DIAGNOSIS — Z3A00 Weeks of gestation of pregnancy not specified: Secondary | ICD-10-CM | POA: Diagnosis not present

## 2015-02-04 DIAGNOSIS — IMO0002 Reserved for concepts with insufficient information to code with codable children: Secondary | ICD-10-CM

## 2015-02-04 DIAGNOSIS — O3680X0 Pregnancy with inconclusive fetal viability, not applicable or unspecified: Secondary | ICD-10-CM | POA: Insufficient documentation

## 2015-02-04 NOTE — Telephone Encounter (Signed)
Patient seen in [place] for fetal demise.  Discussed options of expectant management, cytotec, vs D&E.  Pt would like expectant management at this time.  Discussed signs of when to call or come to MAU - severe bleeding (greater than 1 pad per hour, presyncope symptoms, fever, chills, etc).  Will follow up with pt in about 1 week.  Levie Heritage, DO 02/04/2015 3:31 PM

## 2015-02-13 ENCOUNTER — Ambulatory Visit (INDEPENDENT_AMBULATORY_CARE_PROVIDER_SITE_OTHER): Payer: Self-pay | Admitting: Family Medicine

## 2015-02-13 VITALS — BP 111/62 | HR 90 | Temp 98.5°F

## 2015-02-13 DIAGNOSIS — O021 Missed abortion: Secondary | ICD-10-CM

## 2015-02-13 LAB — POCT URINALYSIS DIP (DEVICE)
Bilirubin Urine: NEGATIVE
Glucose, UA: NEGATIVE mg/dL
Ketones, ur: NEGATIVE mg/dL
Nitrite: NEGATIVE
PROTEIN: NEGATIVE mg/dL
SPECIFIC GRAVITY, URINE: 1.02 (ref 1.005–1.030)
Urobilinogen, UA: 0.2 mg/dL (ref 0.0–1.0)
pH: 6 (ref 5.0–8.0)

## 2015-02-13 MED ORDER — MISOPROSTOL 200 MCG PO TABS: 200.0000 ug | ORAL_TABLET | Freq: Once | ORAL | Status: DC

## 2015-02-14 NOTE — Progress Notes (Signed)
   Subjective:    Patient ID: Erin Boyd, female    DOB: 11-23-1985, 29 y.o.   MRN: [id number]  HPI Patient seen for missed AB. She was diagnosed approximately one week ago. She was exposed to zika virus. She has been approved for testing. She denies cramping, bleeding, abdominal pain, fevers, nausea, vomiting.   Review of Systems     Objective:   Physical Exam  Constitutional: She is oriented to person, place, and time. She appears well-developed and well-nourished.  Cardiovascular: Normal rate, regular rhythm and normal heart sounds.   Pulmonary/Chest: Effort normal and breath sounds normal. No respiratory distress. She has no wheezes. She has no rales. She exhibits no tenderness.  Abdominal: Soft. She exhibits no mass. There is no tenderness. There is no rebound and no guarding.  Neurological: She is alert and oriented to person, place, and time.  Skin: Skin is warm and dry.  Psychiatric: She has a normal mood and affect. Her behavior is normal.      Assessment & Plan:   1. Missed abortion Discussed options of medical versus surgical intervention. Patient would like to proceed with medical intervention with Cytotec. Discussed to take Cytotec and place buccally. Patient to go to [facility] each ear with profuse bleeding, severe cramping. Will obtain urine and blood samples for Zika virus testing.  - POCT urinalysis dip (device) - misoprostol (CYTOTEC) 200 MCG tablet; Take 1 tablet (200 mcg total) by mouth once.  Dispense: 3 tablet; Refill: 0

## 2015-02-20 ENCOUNTER — Ambulatory Visit: Payer: Self-pay | Admitting: Family Medicine

## 2015-02-21 ENCOUNTER — Encounter: Payer: Self-pay | Admitting: Family Medicine

## 2015-03-07 ENCOUNTER — Ambulatory Visit: Payer: Self-pay | Admitting: Family Medicine

## 2015-04-24 ENCOUNTER — Other Ambulatory Visit (HOSPITAL_COMMUNITY)
Admission: RE | Admit: 2015-04-24 | Discharge: 2015-04-24 | Disposition: A | Discharge status: Home / Self Care | Payer: Medicaid Other | Source: Ambulatory Visit | Attending: Obstetrics & Gynecology | Admitting: Obstetrics & Gynecology

## 2015-04-24 ENCOUNTER — Ambulatory Visit (INDEPENDENT_AMBULATORY_CARE_PROVIDER_SITE_OTHER): Payer: Medicaid Other | Admitting: Obstetrics & Gynecology

## 2015-04-24 ENCOUNTER — Encounter: Payer: Self-pay | Admitting: Obstetrics & Gynecology

## 2015-04-24 VITALS — BP 108/81 | HR 78 | Temp 98.5°F | Ht 70.0 in | Wt 230.0 lb

## 2015-04-24 DIAGNOSIS — Z23 Encounter for immunization: Secondary | ICD-10-CM | POA: Diagnosis not present

## 2015-04-24 DIAGNOSIS — Z124 Encounter for screening for malignant neoplasm of cervix: Secondary | ICD-10-CM | POA: Diagnosis not present

## 2015-04-24 DIAGNOSIS — Z113 Encounter for screening for infections with a predominantly sexual mode of transmission: Secondary | ICD-10-CM

## 2015-04-24 DIAGNOSIS — Z01419 Encounter for gynecological examination (general) (routine) without abnormal findings: Secondary | ICD-10-CM | POA: Diagnosis present

## 2015-04-24 MED ORDER — METRONIDAZOLE 500 MG PO TABS: 500.0000 mg | ORAL_TABLET | Freq: Two times a day (BID) | ORAL | Status: DC

## 2015-04-24 MED ORDER — NORGESTREL-ETHINYL ESTRADIOL 0.3-30 MG-MCG PO TABS: 1.0000 | ORAL_TABLET | Freq: Every day | ORAL | Status: DC

## 2015-04-24 NOTE — Progress Notes (Signed)
Subjective:    Erin Boyd is a 29 y.o. S AA P1 53(27 yo daughter)  female who presents for an annual exam. The patient has no complaints today. She has a vaginal discharge with a small odor for about 2 days. The patient is sexually active. GYN screening history: last pap: was normal. The patient wears seatbelts: yes. The patient participates in regular exercise: yes. Has the patient ever been transfused or tattooed?: yes. The patient reports that there is not domestic violence in her life.   Menstrual History: OB History    Gravida Para Term Preterm AB TAB SAB Ectopic Multiple Living   5 1 1  0 3 2 1  0 0 1      Menarche age: 89  Patient's last menstrual period was 04/12/2015 (within days).    The following portions of the patient's history were reviewed and updated as appropriate: allergies, current medications, past family history, past medical history, past social history, past surgical history and problem list.  Review of Systems Pertinent items noted in HPI and remainder of comprehensive ROS otherwise negative. Flu vaccine due. Works as a Transport plannerhair distributer. Monogamous for 2 1/2 years, denies dyspareunia. Wants OCPs for contraception.   Objective:    BP 108/81 mmHg  Pulse 78  Temp(Src) 98.5 F (36.9 C) (Oral)  Ht 5\' 10"  (1.778 m)  Wt 230 lb (104.327 kg)  BMI 33.00 kg/m2  LMP 04/12/2015 (Within Days)  Breastfeeding? No  General Appearance:    Alert, cooperative, no distress, appears stated age  Head:    Normocephalic, without obvious abnormality, atraumatic  Eyes:    PERRL, conjunctiva/corneas clear, EOM's intact, fundi    benign, both eyes  Ears:    Normal TM's and external ear canals, both ears  Nose:   Nares normal, septum midline, mucosa normal, no drainage    or sinus tenderness  Throat:   Lips, mucosa, and tongue normal; teeth and gums normal  Neck:   Supple, symmetrical, trachea midline, no adenopathy;    thyroid:  no enlargement/tenderness/nodules; no  carotid   bruit or JVD  Back:     Symmetric, no curvature, ROM normal, no CVA tenderness  Lungs:     Clear to auscultation bilaterally, respirations unlabored  Chest Wall:    No tenderness or deformity   Heart:    Regular rate and rhythm, S1 and S2 normal, no murmur, rub   or gallop  Breast Exam:    No tenderness, masses, or nipple abnormality  Abdomen:     Soft, non-tender, bowel sounds active all four quadrants,    no masses, no organomegaly  Genitalia:    Normal female without lesion, discharge or tenderness, frothy vaginal discharge c/w BV, NSS mid plane, NT, mobile, no adnexal masses     Extremities:   Extremities normal, atraumatic, no cyanosis or edema  Pulses:   2+ and symmetric all extremities  Skin:   Skin color, texture, turgor normal, no rashes or lesions  Lymph nodes:   Cervical, supraclavicular, and axillary nodes normal  Neurologic:   CNII-XII intact, normal strength, sensation and reflexes    throughout  .    Assessment:    Healthy female exam.   Contraception Discharge, probably BV   Plan:     Breast self exam technique reviewed and patient encouraged to perform self-exam monthly. Thin prep Pap smear. STI testing   Flu vaccine Lo Ovral. Back up method for 2 weeks Flagyl

## 2015-04-25 LAB — CYTOLOGY - PAP

## 2015-04-25 LAB — RPR

## 2015-04-25 LAB — WET PREP, GENITAL
Trich, Wet Prep: NONE SEEN
Yeast Wet Prep HPF POC: NONE SEEN

## 2015-04-25 LAB — HEPATITIS C ANTIBODY: HCV AB: NEGATIVE

## 2015-04-25 LAB — HEPATITIS B SURFACE ANTIGEN: Hepatitis B Surface Ag: NEGATIVE

## 2015-04-25 LAB — HIV ANTIBODY (ROUTINE TESTING W REFLEX): HIV 1&2 Ab, 4th Generation: NONREACTIVE

## 2015-04-25 LAB — GC/CHLAMYDIA PROBE AMP (~~LOC~~) NOT AT ARMC
Chlamydia: NEGATIVE
Neisseria Gonorrhea: NEGATIVE

## 2015-06-26 ENCOUNTER — Encounter (HOSPITAL_BASED_OUTPATIENT_CLINIC_OR_DEPARTMENT_OTHER): Payer: Self-pay

## 2015-06-26 ENCOUNTER — Emergency Department (HOSPITAL_BASED_OUTPATIENT_CLINIC_OR_DEPARTMENT_OTHER): Payer: Medicaid Other

## 2015-06-26 ENCOUNTER — Emergency Department (HOSPITAL_BASED_OUTPATIENT_CLINIC_OR_DEPARTMENT_OTHER)
Admission: EM | Admit: 2015-06-26 | Discharge: 2015-06-27 | Disposition: A | Discharge status: Home / Self Care | Payer: Medicaid Other | Attending: Emergency Medicine | Admitting: Emergency Medicine

## 2015-06-26 DIAGNOSIS — Z79899 Other long term (current) drug therapy: Secondary | ICD-10-CM | POA: Diagnosis not present

## 2015-06-26 DIAGNOSIS — O2230 Deep phlebothrombosis in pregnancy, unspecified trimester: Secondary | ICD-10-CM

## 2015-06-26 DIAGNOSIS — I82622 Acute embolism and thrombosis of deep veins of left upper extremity: Secondary | ICD-10-CM | POA: Diagnosis not present

## 2015-06-26 DIAGNOSIS — Z87891 Personal history of nicotine dependence: Secondary | ICD-10-CM | POA: Insufficient documentation

## 2015-06-26 DIAGNOSIS — Z3A11 11 weeks gestation of pregnancy: Secondary | ICD-10-CM | POA: Diagnosis not present

## 2015-06-26 DIAGNOSIS — O9989 Other specified diseases and conditions complicating pregnancy, childbirth and the puerperium: Secondary | ICD-10-CM | POA: Diagnosis present

## 2015-06-26 DIAGNOSIS — O2231 Deep phlebothrombosis in pregnancy, first trimester: Secondary | ICD-10-CM | POA: Diagnosis not present

## 2015-06-26 DIAGNOSIS — Z792 Long term (current) use of antibiotics: Secondary | ICD-10-CM | POA: Diagnosis not present

## 2015-06-26 DIAGNOSIS — Z91040 Latex allergy status: Secondary | ICD-10-CM | POA: Diagnosis not present

## 2015-06-26 LAB — CBC WITH DIFFERENTIAL/PLATELET
BASOS ABS: 0 10*3/uL (ref 0.0–0.1)
BASOS PCT: 0 %
EOS ABS: 0.2 10*3/uL (ref 0.0–0.7)
Eosinophils Relative: 3 %
HEMATOCRIT: 36.9 % (ref 36.0–46.0)
HEMOGLOBIN: 12.2 g/dL (ref 12.0–15.0)
Lymphocytes Relative: 34 %
Lymphs Abs: 2.8 10*3/uL (ref 0.7–4.0)
MCH: 28.1 pg (ref 26.0–34.0)
MCHC: 33.1 g/dL (ref 30.0–36.0)
MCV: 85 fL (ref 78.0–100.0)
Monocytes Absolute: 0.5 10*3/uL (ref 0.1–1.0)
Monocytes Relative: 6 %
NEUTROS ABS: 4.8 10*3/uL (ref 1.7–7.7)
NEUTROS PCT: 57 %
Platelets: 172 10*3/uL (ref 150–400)
RBC: 4.34 MIL/uL (ref 3.87–5.11)
RDW: 14.2 % (ref 11.5–15.5)
WBC: 8.4 10*3/uL (ref 4.0–10.5)

## 2015-06-26 LAB — BASIC METABOLIC PANEL
ANION GAP: 8 (ref 5–15)
BUN: 10 mg/dL (ref 6–20)
CALCIUM: 8.8 mg/dL — AB (ref 8.9–10.3)
CHLORIDE: 105 mmol/L (ref 101–111)
CO2: 21 mmol/L — AB (ref 22–32)
CREATININE: 0.54 mg/dL (ref 0.44–1.00)
GFR calc non Af Amer: >60 mL/min (ref >60)
Glucose, Bld: 92 mg/dL (ref 65–99)
Potassium: 3.5 mmol/L (ref 3.5–5.1)
SODIUM: 134 mmol/L — AB (ref 135–145)

## 2015-06-26 MED ORDER — ENOXAPARIN SODIUM 100 MG/ML ~~LOC~~ SOLN: 1.0000 mg/kg | Freq: Two times a day (BID) | SUBCUTANEOUS | Status: DC

## 2015-06-26 MED ORDER — ENOXAPARIN SODIUM 120 MG/0.8ML ~~LOC~~ SOLN
1.0000 mg/kg | Freq: Once | SUBCUTANEOUS | Status: AC
  Administered 2015-06-26: 105 mg via SUBCUTANEOUS
  Filled 2015-06-26: qty 0.8

## 2015-06-26 MED ORDER — ACETAMINOPHEN 325 MG PO TABS
650.0000 mg | ORAL_TABLET | Freq: Once | ORAL | Status: AC
  Administered 2015-06-26: 650 mg via ORAL
  Filled 2015-06-26: qty 2

## 2015-06-26 NOTE — ED Notes (Signed)
Unable to talk with pt d/t talking on cell phone.

## 2015-06-26 NOTE — ED Notes (Signed)
Patient transported to Ultrasound 

## 2015-06-26 NOTE — ED Notes (Addendum)
Pt reports urticaria on Saturday - Sunday developed left forearm pain that radiates into left upper arm. Denies known injury. Denies presence of rash. Pt is [redacted] weeks pregnant. Pt seen by [facility] - pt is G6P1 - pt denies pregnancy related complaints. Pt takes vitamins. Denies pregnancy complications.

## 2015-06-26 NOTE — Discharge Instructions (Signed)
Deep Vein Thrombosis Follow up with Dr. Ambrose Boyd tomorrow. Use the lovenox as prescribed. You must seem him for additional prescriptions of this medication. Do NOT run out of this medication. Return to the ED if you develop chest pain, shortness of breath, or any other concerns. A deep vein thrombosis (DVT) is a blood clot (thrombus) that usually occurs in a deep, larger vein of the lower leg or the pelvis, or in an upper extremity such as the arm. These are dangerous and can lead to serious and even life-threatening complications if the clot travels to the lungs. A DVT can damage the valves in your leg veins so that instead of flowing upward, the blood pools in the lower leg. This is called post-thrombotic syndrome, and it can result in pain, swelling, discoloration, and sores on the leg. CAUSES A DVT is caused by the formation of a blood clot in your leg, pelvis, or arm. Usually, several things contribute to the formation of blood clots. A clot may develop when:  Your blood flow slows down.  Your vein becomes damaged in some way.  You have a condition that makes your blood clot more easily. RISK FACTORS A DVT is more likely to develop in:  People who are older, especially over 66 years of age.  People who are overweight (obese).  People who sit or lie still for a long time, such as during long-distance travel (over 4 hours), bed rest, hospitalization, or during recovery from certain medical conditions like a stroke.  People who do not engage in much physical activity (sedentary lifestyle).  People who have chronic breathing disorders.  People who have a personal or family history of blood clots or blood clotting disease.  People who have peripheral vascular disease (PVD), diabetes, or some types of cancer.  People who have heart disease, especially if the person had a recent heart attack or has congestive heart failure.  People who have neurological diseases that affect the legs (leg  paresis).  People who have had a traumatic injury, such as breaking a hip or leg.  People who have recently had major or lengthy surgery, especially on the hip, knee, or abdomen.  People who have had a central line placed inside a large vein.  People who take medicines that contain the hormone estrogen. These include birth control pills and hormone replacement therapy.  Pregnancy or during childbirth or the postpartum period.  Long plane flights (over 8 hours). SIGNS AND SYMPTOMS Symptoms of a DVT can include:   Swelling of your leg or arm, especially if one side is much worse.  Warmth and redness of your leg or arm, especially if one side is much worse.  Pain in your arm or leg. If the clot is in your leg, symptoms may be more noticeable or worse when you stand or walk.  A feeling of pins and needles, if the clot is in the arm. The symptoms of a DVT that has traveled to the lungs (pulmonary embolism, PE) usually start suddenly and include:  Shortness of breath while active or at rest.  Coughing or coughing up blood or blood-tinged mucus.  Chest pain that is often worse with deep breaths.  Rapid or irregular heartbeat.  Feeling light-headed or dizzy.  Fainting.  Feeling anxious.  Sweating. There may also be pain and swelling in a leg if that is where the blood clot started. These symptoms may represent a serious problem that is an emergency. Do not wait to see if  the symptoms will go away. Get medical help right away. Call your local emergency services (911 in the [place] Do not drive yourself to the hospital. DIAGNOSIS Your health care provider will take a medical history and perform a physical exam. You may also have other tests, including:  Blood tests to assess the clotting properties of your blood.  Imaging tests, such as CT, ultrasound, MRI, X-ray, and other tests to see if you have clots anywhere in your body. TREATMENT After a DVT is identified, it can be  treated. The type of treatment that you receive depends on many factors, such as the cause of your DVT, your risk for bleeding or developing more clots, and other medical conditions that you have. Sometimes, a combination of treatments is necessary. Treatment options may be combined and include:  Monitoring the blood clot with ultrasound.  Taking medicines by mouth, such as newer blood thinners (anticoagulants), thrombolytics, or warfarin.  Taking anticoagulant medicine by injection or through an IV tube.  Wearing compression stockings or using different types ofdevices.  Surgery (rare) to remove the blood clot or to place a filter in your abdomen to stop the blood clot from traveling to your lungs. Treatments for a DVT are often divided into immediate treatment and long-term treatment (up to 3 months after DVT). You can work with your health care provider to choose the treatment program that is best for you. HOME CARE INSTRUCTIONS If you are taking a newer oral anticoagulant:  Take the medicine every single day at the same time each day.  Understand what foods and drugs interact with this medicine.  Understand that there are no regular blood tests required when using this medicine.  Understand the side effects of this medicine, including excessive bruising or bleeding. Ask your health care provider or pharmacist about other possible side effects. If you are taking warfarin:  Understand how to take warfarin and know which foods can affect how warfarin works in Public relations account executive.  Understand that it is dangerous to take too much or too little warfarin. Too much warfarin increases the risk of bleeding. Too little warfarin continues to allow the risk for blood clots.  Follow your PT and INR blood testing schedule. The PT and INR results allow your health care provider to adjust your dose of warfarin. It is very important that you have your PT and INR tested as often as told by your health care  provider.  Avoid major changes in your diet, or tell your health care provider before you change your diet. Arrange a visit with a registered dietitian to answer your questions. Many foods, especially foods that are high in vitamin K, can interfere with warfarin and affect the PT and INR results. Eat a consistent amount of foods that are high in vitamin K, such as:  Spinach, kale, broccoli, cabbage, collard greens, turnip greens, Brussels sprouts, peas, cauliflower, seaweed, and parsley.  Beef liver and pork liver.  Green tea.  Soybean oil.  Tell your health care provider about any and all medicines, vitamins, and supplements that you take, including aspirin and other over-the-counter anti-inflammatory medicines. Be especially cautious with aspirin and anti-inflammatory medicines. Do not take those before you ask your health care provider if it is safe to do so. This is important because many medicines can interfere with warfarin and affect the PT and INR results.  Do not start or stop taking any over-the-counter or prescription medicine unless your health care provider or pharmacist tells you to  do so. If you take warfarin, you will also need to do these things:  Hold pressure over cuts for longer than usual.  Tell your dentist and other health care providers that you are taking warfarin before you have any procedures in which bleeding may occur.  Avoid alcohol or drink very small amounts. Tell your health care provider if you change your alcohol intake.  Do not use tobacco products, including cigarettes, chewing tobacco, and e-cigarettes. If you need help quitting, ask your health care provider.  Avoid contact sports. General Instructions  Take over-the-counter and prescription medicines only as told by your health care provider. Anticoagulant medicines can have side effects, including easy bruising and difficulty stopping bleeding. If you are prescribed an anticoagulant, you will also  need to do these things:  Hold pressure over cuts for longer than usual.  Tell your dentist and other health care providers that you are taking anticoagulants before you have any procedures in which bleeding may occur.  Avoid contact sports.  Wear a medical alert bracelet or carry a medical alert card that says you have had a PE.  Ask your health care provider how soon you can go back to your normal activities. Stay active to prevent new blood clots from forming.  Make sure to exercise while traveling or when you have been sitting or standing for a long period of time. It is very important to exercise. Exercise your legs by walking or by tightening and relaxing your leg muscles often. Take frequent walks.  Wear compression stockings as told by your health care provider to help prevent more blood clots from forming.  Do not use tobacco products, including cigarettes, chewing tobacco, and e-cigarettes. If you need help quitting, ask your health care provider.  Keep all follow-up appointments with your health care provider. This is important. PREVENTION Take these actions to decrease your risk of developing another DVT:  Exercise regularly. For at least 30 minutes every day, engage in:  Activity that involves moving your arms and legs.  Activity that encourages good blood flow through your body by increasing your heart rate.  Exercise your arms and legs every hour during long-distance travel (over 4 hours). Drink plenty of water and avoid drinking alcohol while traveling.  Avoid sitting or lying in bed for long periods of time without moving your legs.  Maintain a weight that is appropriate for your height. Ask your health care provider what weight is healthy for you.  If you are a woman who is over 1 years of age, avoid unnecessary use of medicines that contain estrogen. These include birth control pills.  Do not smoke, especially if you take estrogen medicines. If you need help  quitting, ask your health care provider. If you are hospitalized, prevention measures may include:  Early walking after surgery, as soon as your health care provider says that it is safe.  Receiving anticoagulants to prevent blood clots.If you cannot take anticoagulants, other options may be available, such as wearing compression stockings or using different types of devices. SEEK IMMEDIATE MEDICAL CARE IF:  You have new or increased pain, swelling, or redness in an arm or leg.  You have numbness or tingling in an arm or leg.  You have shortness of breath while active or at rest.  You have chest pain.  You have a rapid or irregular heartbeat.  You feel light-headed or dizzy.  You cough up blood.  You notice blood in your vomit, bowel movement, or urine.  These symptoms may represent a serious problem that is an emergency. Do not wait to see if the symptoms will go away. Get medical help right away. Call your local emergency services (911 in the [place] Do not drive yourself to the hospital.   This information is not intended to replace advice given to you by your health care provider. Make sure you discuss any questions you have with your health care provider.   Document Released: 05/25/2005 Document Revised: 02/13/2015 Document Reviewed: 09/19/2014 Elsevier Interactive Patient Education [employer]

## 2015-06-26 NOTE — ED Provider Notes (Signed)
Medical screening examination/treatment/procedure(s) were conducted as a shared visit with non-physician practitioner(s) and myself.  I personally evaluated the patient during the encounter.   pain and swelling to left forearm for the past 3 days. Denies rash. Denies chest pain or shortness of breath. States she is about [redacted] weeks pregnant, G6 P1. No vaginal bleeding or abdominal pain. Has not had an ultrasound this pregnancy. Tenderness to palpation of the left proximal forearm with minimal overlying erythema. Full range of motion of wrist and elbow. Intact radial pulse. No CP or SOB. No tachycardia or hypoxia.   No indication for emergent pelvic ultrasound tonight. There is no vaginal bleeding or abdominal pain. Treat upper extremity DVT with Lovenox. Discussed with her OB doctor Meisinger. Discussed with patient that she may have underlying hypercoaguable state in addition to her pregnancy. Lovenox BID, followup with OB tomorrow. Discussed with patient importance of close followup and needs to see OB for further lovenox because she cannot run out.       EKG Interpretation None        Glynn Octave, MD 06/27/15 [phone]

## 2015-06-26 NOTE — ED Notes (Addendum)
Patient talking on cell phone during triage, NAD noted at this time.

## 2015-06-26 NOTE — ED Provider Notes (Signed)
CSN: [id number]     Arrival date & time 06/26/15  1717 History   First MD Initiated Contact with Patient 06/26/15 1757     Chief Complaint  Patient presents with  . Arm Pain    HPI   [name] is an 30 y.o. female G6P1 currently estimated [redacted] weeks pregnant who presents to the ED for evaluation of left arm pain. She states that she was in her usual state of health until four days ago when she noticed a hive-like rash on her bilateral forearms. She states that this rash resolved on its own. However, since the rash resolved she has noticed pain along the medial aspect of her left elbow that radiates to her mid forearm. She denies any injury or trauma. She states she feels like she can feel something inside her elbow. States she has not tried anything for the pain. Denies history of blood clots. Denies chest pain or SOB. Denies fever, chills. Denies abdominal pain, nausea/vomiting, vaginal bleeding, vaginal discharge. Endorses prior history of tobacco use. States she has OB US scheduled for Saturday.   Past Medical History  Diagnosis Date  . No pertinent past medical history    Past Surgical History  Procedure Laterality Date  . Pediatric dental surgery    . Induced abortion     History reviewed. No pertinent family history. Social History  Substance Use Topics  . Smoking status: Former Smoker -- 0.50 packs/day    Types: Cigarettes  . Smokeless tobacco: Never Used  . Alcohol Use: 0.0 oz/week     Comment: occasional   OB History    Gravida Para Term Preterm AB TAB SAB Ectopic Multiple Living   0 0 0 1     Review of Systems  All other systems reviewed and are negative.     Allergies  Latex  Home Medications   Prior to Admission medications   Medication Sig Start Date End Date Taking? Authorizing Provider  Prenatal MV-Min-Fe Fum-FA-DHA (PRENATAL 1 PO) Take by mouth.   Yes Historical Provider, MD  metroNIDAZOLE (FLAGYL) 500 MG tablet Take 1 tablet (500 mg  total) by mouth 2 (two) times daily. 04/24/15   Allie Bossier, MD  norgestrel-ethinyl estradiol (LO/OVRAL,CRYSELLE) 0.3-30 MG-MCG tablet Take 1 tablet by mouth daily. 04/24/15   Allie Bossier, MD  Phenazopyridine HCl (AZO TABS PO) Take by mouth.    Historical Provider, MD   BP 126/72 mmHg  Boyd 79  Temp(Src) 98.2 F (36.8 C) (Oral)  Resp 15  Ht  (1.778 m)  Wt 106.595 kg  BMI 33.72 kg/m2  SpO2 96%  LMP 04/11/2015 Physical Exam  Constitutional: She is oriented to person, place, and time.  HENT:  Right Ear: External ear normal.  Left Ear: External ear normal.  Nose: Nose normal.  Mouth/Throat: Oropharynx is clear and moist. No oropharyngeal exudate.  Eyes: Conjunctivae and EOM are normal. Pupils are equal, round, and reactive to light.  Neck: Normal range of motion. Neck supple.  Cardiovascular: Normal rate, regular rhythm, normal heart sounds and intact distal pulses.   Pulmonary/Chest: Effort normal and breath sounds normal. No respiratory distress. She exhibits no tenderness.  Abdominal: Soft. Bowel sounds are normal. She exhibits no distension. There is no tenderness.  Musculoskeletal:  Medial aspect of left elbow markedly ttp. Diffuse left forearm tenderness from elbow to mid-forearm. No wrist or hand tenderness. Intact radial and ulnar pulses. Good cap refill.   Neurological: She is  alert and oriented to person, place, and time. No cranial nerve deficit.  Skin: Skin is warm and dry. She is not diaphoretic.  Psychiatric: She has a normal mood and affect.  Nursing note and vitals reviewed.  FHR 170  ED Course  Procedures (including critical care time) Labs Review Labs Reviewed - No data to display  Imaging Review US Venous Img Upper Uni Left  06/26/2015  CLINICAL DATA:  Left medial elbow pain, acute onset. Initial encounter. EXAM: LEFT UPPER EXTREMITY VENOUS DOPPLER ULTRASOUND TECHNIQUE: Gray-scale sonography with graded compression, as well as color Doppler and duplex  ultrasound were performed to evaluate the upper extremity deep venous system from the level of the subclavian vein and including the jugular, axillary, basilic, radial, ulnar and upper cephalic vein. Spectral Doppler was utilized to evaluate flow at rest and with distal augmentation maneuvers. COMPARISON:  None. FINDINGS: Contralateral Subclavian Vein: Respiratory phasicity is normal and symmetric with the symptomatic side. No evidence of thrombus. Normal compressibility. Internal Jugular Vein: No evidence of thrombus. Normal compressibility, respiratory phasicity and response to augmentation. Subclavian Vein: No evidence of thrombus. Normal compressibility, respiratory phasicity and response to augmentation. Axillary Vein: No evidence of thrombus. Normal compressibility, respiratory phasicity and response to augmentation. Cephalic Vein: No evidence of thrombus. Normal compressibility, respiratory phasicity and response to augmentation. Basilic Vein: Note is made of occlusive thrombus within the left basilic vein. This extends from the distal upper arm to the proximal forearm, 2-3 cm from its junction with the brachial vein. Brachial Veins: No evidence of thrombus. Normal compressibility, respiratory phasicity and response to augmentation. Radial Veins: Not well characterized. Ulnar Veins: No evidence of thrombus. Normal compressibility, respiratory phasicity and response to augmentation. Venous Reflux:  None visualized. Other Findings:  None visualized. IMPRESSION: Occlusive deep venous thrombosis within the left basilic vein. This extends from the distal upper arm to the proximal forearm, 2-3 cm from its junction with the brachial vein. These results were called by telephone at the time of interpretation on 06/26/2015 at 9:45 pm to Dr. Glynn Octave, who verbally acknowledged these results. Electronically Signed   By: Roanna Raider M.D.   On: 06/26/2015 21:45   I have personally reviewed and evaluated these  images and lab results as part of my medical decision-making.   EKG Interpretation None      MDM   Final diagnoses:  DVT (deep vein thrombosis) in pregnancy    Pt has evidence of DVT on [place]. Discussed with attending MD. Will call on-call for [facility] to confirm plan to start lovenox. Pt with [place] scheduled for this Saturday. She has no abdominal or vaginal complaints at this time. FHR 170.  Attending spoke to on-call OB. Will get baseline labs. STart on lovenox /kg. Call clinic tomorrow to schedule f/u appt as an outpatient.  Carlene Coria, PA-C 06/27/15 1408  Glynn Octave, MD 06/27/15 [phone]

## 2015-06-26 NOTE — ED Notes (Signed)
MD at bedside. 

## 2015-06-26 NOTE — ED Notes (Signed)
MD at bedside discussing consult with pts pmd and dispo plan of care

## 2015-06-26 NOTE — ED Notes (Signed)
Updated patient that ultrasound tech states it will be 30 more minutes before she will have her ultrasound.

## 2015-06-27 ENCOUNTER — Telehealth (HOSPITAL_BASED_OUTPATIENT_CLINIC_OR_DEPARTMENT_OTHER): Payer: Self-pay | Admitting: Emergency Medicine

## 2015-06-27 ENCOUNTER — Telehealth: Payer: Self-pay | Admitting: *Deleted

## 2015-06-27 MED ORDER — ENOXAPARIN SODIUM 120 MG/0.8ML ~~LOC~~ SOLN: 105.0000 mg | Freq: Two times a day (BID) | SUBCUTANEOUS | Status: DC

## 2015-06-27 MED FILL — LOVENOX 120 MG PREFILLED SY: 120 | 7 days supply | Qty: 11 | Fill #0

## 2015-06-27 NOTE — Telephone Encounter (Signed)
Pharmacy called related to Rx: enoxaparin (LOVENOX) 100 MG/ML injection 06/26/15 -- Glynn Octave, MD Inject 1.05 mLs (105 mg total) into the skin every 12 (twelve) hours.  Rx comes in /ml or /ml.  Pharmacist did not feel comfortable with giving pt /ml- trusting that pt would waste 15 mg....EDCM clarified with EDP to change Rx directions to: inject 100 mLs (100 mg total).

## 2015-07-06 ENCOUNTER — Encounter: Payer: Self-pay | Admitting: Obstetrics and Gynecology

## 2015-07-06 ENCOUNTER — Other Ambulatory Visit: Payer: Self-pay | Admitting: Obstetrics and Gynecology

## 2015-07-06 DIAGNOSIS — I82629 Acute embolism and thrombosis of deep veins of unspecified upper extremity: Secondary | ICD-10-CM | POA: Insufficient documentation

## 2015-07-06 NOTE — Progress Notes (Signed)
Pt called - unable to fill Lovenox Likely needs prior authorization Will have come for Lovenox injection bid. Will work with Monday, to figure out

## 2015-07-08 ENCOUNTER — Telehealth: Payer: Self-pay | Admitting: Hematology

## 2015-07-08 NOTE — Telephone Encounter (Signed)
PT CONFIRMED APPT FOR 2/8 AT 8:30 DUE TO SCHOOL SCHEDULE

## 2015-07-17 ENCOUNTER — Telehealth: Payer: Self-pay | Admitting: Hematology

## 2015-07-17 NOTE — Telephone Encounter (Signed)
Pt called in early in the am to reschedule appt due to school conflict. Rescheduled for 2/16 at 3

## 2015-07-25 ENCOUNTER — Encounter: Payer: Self-pay | Admitting: Hematology

## 2015-07-25 ENCOUNTER — Ambulatory Visit (HOSPITAL_BASED_OUTPATIENT_CLINIC_OR_DEPARTMENT_OTHER): Payer: Medicaid Other | Admitting: Hematology

## 2015-07-25 VITALS — BP 115/68 | HR 76 | Temp 97.8°F | Resp 18 | Ht 70.0 in | Wt 235.1 lb

## 2015-07-25 DIAGNOSIS — I82622 Acute embolism and thrombosis of deep veins of left upper extremity: Secondary | ICD-10-CM

## 2015-07-28 ENCOUNTER — Encounter: Payer: Self-pay | Admitting: Hematology

## 2015-07-28 NOTE — Progress Notes (Signed)
Marland Kitchen    HEMATOLOGY/ONCOLOGY CONSULTATION NOTE  Date of Service: .07/25/2015  Patient Care Team: No Pcp Per Patient as PCP - General (General Practice)  CHIEF COMPLAINTS/PURPOSE OF CONSULTATION:  LUE basilic DVT during pregnancy  HISTORY OF PRESENTING ILLNESS:   Erin Boyd is a wonderful 30 y.o. female who has been referred to [place] by Dr Augusto Gamble Carlean Jews MD for evaluation and management of newly diagnosed LUE DVT during pregnancy.  Patient is left-handed and presented with left arm soreness around 26 June 2015 while she was about [redacted] weeks pregnant. She underwent an ultrasound which showed an occlusive deep venous thrombosis within the left basilic vein extending from the distal upper arm to the proximal forearm, 2-3 cm from its junction with the brachial vein. Patient reports no previous history of venous thromboembolism.  No family history of venous thromboembolism. Denies having any IV lines in the left arm.  Has had a pregnancy 8 years ago without any VTE.  She has used hormonal contraception for about 88yrs in the past without any complications.  She notes that she has had one abortion and one miscarriage in this last year prior to her current pregnancy.  She notes that she had started oral contraceptive pills in September 2016 and despite that became pregnant soon after that.   Her GYN doctor Dr. Ellyn Hack started on Lovenox 100 mg twice a day for her new DVT.  She notes that her left arm soreness has since resolved. She had a thrombophilia workup that showed a negative antiphospholipid antibody screen including beta-2 glycoprotein, anticardiolipin antibody and lupus anticoagulant. Factor IV Leiden mutation negative.  Prothrombin gene mutation not done.  Antithrombin III within normal limits.  Normal hemoglobin electrophoresis.  Other acquired risk factors: Patient was a former smokerhalf a pack per day.  Quit 2 years ago.  Obesity BMI nearly 34.  Was on oral contraceptive  pills when she got pregnant.  Is currently [redacted] weeks pregnant.  No issues with bleeding on the Lovenox.  He has never had issues with VTE in the past.  MEDICAL HISTORY:  Past Medical History  Diagnosis Date  . No pertinent past medical history    Obesity .Body mass index is 33.73 kg/(m^2).   SURGICAL HISTORY: Past Surgical History  Procedure Laterality Date  . Pediatric dental surgery    . Induced abortion      SOCIAL HISTORY: Social History   Social History  . Marital Status: Single    Spouse Name: N/A  . Number of Children: N/A  . Years of Education: N/A   Occupational History  . Not on file.   Social History Main Topics  . Smoking status: Former Smoker -- 0.50 packs/day    Types: Cigarettes  . Smokeless tobacco: Never Used  . Alcohol Use: No     Comment: occasional  . Drug Use: No  . Sexual Activity: Yes    Birth Control/ Protection: None   Other Topics Concern  . Not on file   Social History Narrative    FAMILY HISTORY: History reviewed. No pertinent family history.  ALLERGIES:  is allergic to latex.  MEDICATIONS:  Current Outpatient Prescriptions  Medication Sig Dispense Refill  . enoxaparin (LOVENOX) 100 MG/ML injection Inject 1.05 mLs (105 mg total) into the skin every 12 (twelve) hours. 14 Syringe 0   No current facility-administered medications for this visit.    REVIEW OF SYSTEMS:    10 Point review of Systems was done is negative except as  noted above.  PHYSICAL EXAMINATION: ECOG PERFORMANCE STATUS: 1 - Symptomatic but completely ambulatory  . Filed Vitals:   07/25/15 1547  BP: 115/68  Pulse: 76  Temp: 97.8 F (36.6 C)  Resp: 18   Filed Weights   07/25/15 1547  Weight: 235 lb 1.6 oz (106.641 kg)   .Body mass index is 33.73 kg/(m^2).  GENERAL:alert, in no acute distress and comfortable SKIN: skin color, texture, turgor are normal, no rashes or significant lesions EYES: normal, conjunctiva are pink and non-injected, sclera  clear OROPHARYNX:no exudate, no erythema and lips, buccal mucosa, and tongue normal  NECK: supple, no JVD, thyroid normal size, non-tender, without nodularity LYMPH:  no palpable lymphadenopathy in the cervical, axillary or inguinal LUNGS: clear to auscultation with normal respiratory effort HEART: regular rate & rhythm,  no murmurs and no lower extremity edema ABDOMEN: abdomen soft, non-tender, normoactive bowel sounds , gravid uterus Musculoskeletal: no cyanosis of digits and no clubbing , no significant UE or LE swelling. PSYCH: alert & oriented x 3 with fluent speech NEURO: no focal motor/sensory deficits  LABORATORY DATA:  I have reviewed the data as listed  . CBC Latest Ref Rng 06/26/2015 01/03/2015 04/16/2014  WBC 4.0 - 10.5 K/uL 8.4 6.3 7.7  Hemoglobin 12.0 - 15.0 g/dL 16.1 09.6 15.1(H)  Hematocrit 36.0 - 46.0 % 36.9 39.1 44.0  Platelets 150 - 400 K/uL 172 149(L) 181    . CMP Latest Ref Rng 06/26/2015 04/17/2014 04/16/2014  Glucose 65 - 99 mg/dL 92 045(W) 098(J)  BUN 6 - 20 mg/dL Creatinine 0.44 - 1.00 mg/dL 1.91 4.78 2.95  Sodium 135 - 145 mmol/L 134(L) 139 138  Potassium 3.5 - 5.1 mmol/L 3.5 3.6(L) 3.7  Chloride 101 - 111 mmol/L 105 102 101  CO2 22 - 32 mmol/L 21(L) 21 21  Calcium 8.9 - 10.3 mg/dL 6.2(Z) 9.5 9.6  Total Protein 6.0 - 8.3 g/dL - - 7.8  Total Bilirubin 0.3 - 1.2 mg/dL - - 0.3  Alkaline Phos 39 - 117 U/L - - 52  AST 0 - 37 U/L - - 24  ALT 0 - 35 U/L - - 20     RADIOGRAPHIC STUDIES: I have personally reviewed the radiological images as listed and agreed with the findings in the report. No results found.  [place] unilateral left UE 06/26/2015;   IMPRESSION: Occlusive deep venous thrombosis within the left basilic vein. This extends from the distal upper arm to the proximal forearm, 2-3 cm from its junction with the brachial vein.  These results were called by telephone at the time of interpretation on 06/26/2015 at 9:45 pm to Dr. Glynn Octave,  who verbally acknowledged these results.   Electronically Signed  By: Roanna Raider M.D.  On: 06/26/2015 21:45   ASSESSMENT & PLAN:   30 yo AAF with   1) LUE basilic DVT diagnosed on [place] on 06/26/2015. Obvious risk factor include pregnancy, recent OCP exposure, obesity. She has no Fhx of VTE and has not previously had VTE despite having hormonal trigger events including pregnancy, miscarriage and hormonal contraception.  She has been on therapeutic lovenox with resolution of symptoms of left arm pain. Her partial hypercoagulopathy workup was neg for Factor V leiden mutation, nl ATIII, neg for APL antibodies. Prothrombin antibodies were done (presumably instead of a plan to get prothrombin gene mutation study)  Plan -overall clinical picture is not overtly concerning for a hereditary thrombophilia. -would consider this hormonally triggered in the setting of pregnancy or  perhaps from a local vascular risk factor as such venous kinking (denies blood draws or IV line in that area) -patient is left handed. -do not recommend more extensive hypercoagulability workup at this time. Might consider prothrombin gene mutation testing with during her next antenatal lab draws. She did not want labs drawn today in clinic. -would treat with therapeutic lovenox for a total of 3 months and then switch to prophylactic lovenox till 6 weeks post-partum in the absence of any bleeding issues. -would recommend against hormonal contraception in the future. If patient wants to choose one Mirena would porbably be the safest option. -might need discussion of the pros vs cons of prophylactic anticoagulation with future pregnancies. -All these considerations were discussed in details with the patient and she is agreeable to this plan. -we also discuss VTE risk reduction strategies including working on healthy weight loss post pregnancy, maintaining physical activity level, absolute smoking cessation, avoiding  dehydration, avoid significant immobility etc.  RTC with Dr Candise Che on an as needed basis if any new questions/concerns arise. Continue f/u with PCP and Dr Ellyn Hack.  All of the patients questions were answered with apparent satisfaction. The patient knows to call the clinic with any problems, questions or concerns.  I spent 55 minutes counseling the patient face to face. The total time spent in the appointment was 60 minutes and more than 50% was on counseling and direct patient cares.    Wyvonnia Lora MD MS AAHIVMS [facility] Hematology/Oncology Physician [facility]  (Office):       [phone] (Work cell):  [phone] (Fax):           [phone]

## 2015-08-14 ENCOUNTER — Ambulatory Visit: Payer: Self-pay | Admitting: Hematology

## 2015-08-25 ENCOUNTER — Encounter: Payer: Self-pay | Admitting: Obstetrics and Gynecology

## 2015-08-25 DIAGNOSIS — O3440 Maternal care for other abnormalities of cervix, unspecified trimester: Secondary | ICD-10-CM

## 2015-08-25 HISTORY — DX: Maternal care for other abnormalities of cervix, unspecified trimester: O34.40

## 2015-08-25 NOTE — H&P (Signed)
Erin Boyd is a 29 y.o. female G6P1041 at 19+ presenting for rescue cerclage.  Pt had desired TAB, after laminara placed changed mind.  D/W pt with advanced cervical dilation (3cm by report) possibility of being unable to place cerclage.  Also possibility of infection and labor and delivery of fetus prior to viability.  D/W pt r/b/a of cerclage placement.  Pt with h/o LUE DVT 2/17 - On Lovenox 100mg SQ bid.  Maternal Medical History:  Fetal activity: Perceived fetal activity is normal.      OB History    Gravida Para Term Preterm AB TAB SAB Ectopic Multiple Living   6 1 1 0 3 2 1 0 0 1    TAB x 2 SAB x 2 SVD 7#7 female  Abn pap as teen, normal since last 04/11/15 WNL  H/o Chl  Past Medical History  Diagnosis Date  . No pertinent past medical history   . Cervical abnormality affecting pregnancy, antepartum 08/25/2015  DVT LUE 2/17 Past Surgical History  Procedure Laterality Date  . Pediatric dental surgery    . Induced abortion     Family History: family history is negative for Clotting disorder. Social History:  reports that she has quit smoking. Her smoking use included Cigarettes. She smoked 0.50 packs per day. She has never used smokeless tobacco. She reports that she does not drink alcohol or use illicit drugs.  single, student Meds Lovenox 100bid SQ All Latex    Prenatal Transfer Tool  Maternal Diabetes: No Genetic Screening: Normal Maternal Ultrasounds/Referrals: Normal Fetal Ultrasounds or other Referrals:  None Maternal Substance Abuse:  No Significant Maternal Medications:  Meds include: Other: Lovenox 100mg SQ bid Significant Maternal Lab Results:  None Other Comments:  None  Review of Systems  Constitutional: Negative.   HENT: Negative.   Eyes: Negative.   Respiratory: Negative.   Cardiovascular: Negative.   Gastrointestinal: Negative.   Genitourinary: Negative.   Musculoskeletal: Negative.   Skin: Negative.   Neurological: Negative.    Psychiatric/Behavioral: Negative.       Last menstrual period 04/11/2015. Maternal Exam:  Abdomen: Patient reports no abdominal tenderness. Fundal height is appropriate for gestation.    Introitus: Normal vulva. Normal vagina.  Cervix: Cervix evaluated by digital exam.     Physical Exam  Constitutional: She is oriented to person, place, and time. She appears well-developed and well-nourished.  HENT:  Head: Normocephalic and atraumatic.  Cardiovascular: Normal rate and regular rhythm.   Respiratory: Effort normal and breath sounds normal. No respiratory distress. She has no wheezes.  GI: Soft. Bowel sounds are normal. She exhibits no distension. There is no tenderness.  Musculoskeletal: Normal range of motion.  Neurological: She is alert and oriented to person, place, and time.  Skin: Skin is warm and dry.  Psychiatric: She has a normal mood and affect. Her behavior is normal.    Prenatal labs: ABO, Rh:  A+ Antibody:  neg Rubella:  immune RPR: NON REAC (11/16 1523)  HBsAg: NEGATIVE (11/16 1523)  HIV: NONREACTIVE (11/16 1523)  GBS:   unknown  AFP WNL/Hgb 12.9/Plt 205K/GC neg/Chl neg/Hgb electro WNL  EDC 01/16/16 - nl First Tri US, nl NT  Assessment/Plan: 29yo G6P1041 at 19+  With advanced cervical dilation for rescue cerclage D/w pt r/b/a and process, will proceed Stopped Lovenox for Abortion - has been off for several days Will give Unasyn in holding.   Erin Boyd, Erin Boyd 08/25/2015, 8:50 PM     

## 2015-08-26 ENCOUNTER — Ambulatory Visit (HOSPITAL_COMMUNITY)
Admission: RE | Admit: 2015-08-26 | Discharge: 2015-08-26 | Disposition: A | Discharge status: Home / Self Care | Payer: Medicaid Other | Source: Ambulatory Visit | Attending: Obstetrics and Gynecology | Admitting: Obstetrics and Gynecology

## 2015-08-26 ENCOUNTER — Encounter (HOSPITAL_COMMUNITY)
Admission: RE | Disposition: A | Discharge status: Home / Self Care | Payer: Self-pay | Source: Ambulatory Visit | Attending: Obstetrics and Gynecology

## 2015-08-26 ENCOUNTER — Encounter (HOSPITAL_COMMUNITY): Payer: Self-pay | Admitting: Anesthesiology

## 2015-08-26 ENCOUNTER — Other Ambulatory Visit (HOSPITAL_COMMUNITY): Payer: Self-pay | Admitting: Maternal and Fetal Medicine

## 2015-08-26 ENCOUNTER — Inpatient Hospital Stay (HOSPITAL_COMMUNITY): Admission: RE | Admit: 2015-08-26 | Payer: Self-pay | Source: Ambulatory Visit

## 2015-08-26 ENCOUNTER — Encounter (HOSPITAL_COMMUNITY): Payer: Self-pay | Admitting: *Deleted

## 2015-08-26 DIAGNOSIS — Z3A19 19 weeks gestation of pregnancy: Secondary | ICD-10-CM | POA: Insufficient documentation

## 2015-08-26 DIAGNOSIS — Z5309 Procedure and treatment not carried out because of other contraindication: Secondary | ICD-10-CM | POA: Diagnosis not present

## 2015-08-26 DIAGNOSIS — O2230 Deep phlebothrombosis in pregnancy, unspecified trimester: Secondary | ICD-10-CM

## 2015-08-26 DIAGNOSIS — O3440 Maternal care for other abnormalities of cervix, unspecified trimester: Secondary | ICD-10-CM | POA: Diagnosis present

## 2015-08-26 DIAGNOSIS — O26872 Cervical shortening, second trimester: Secondary | ICD-10-CM

## 2015-08-26 DIAGNOSIS — O3432 Maternal care for cervical incompetence, second trimester: Secondary | ICD-10-CM

## 2015-08-26 DIAGNOSIS — Z3689 Encounter for other specified antenatal screening: Secondary | ICD-10-CM

## 2015-08-26 HISTORY — DX: Headache, unspecified: R51.9

## 2015-08-26 HISTORY — DX: Maternal care for other abnormalities of cervix, unspecified trimester: O34.40

## 2015-08-26 HISTORY — DX: Headache: R51

## 2015-08-26 HISTORY — DX: Heartburn: R12

## 2015-08-26 HISTORY — DX: Other specified pregnancy related conditions, unspecified trimester: O26.899

## 2015-08-26 HISTORY — DX: Deep phlebothrombosis in pregnancy, unspecified trimester: O22.30

## 2015-08-26 SURGERY — CERCLAGE, CERVIX, VAGINAL APPROACH
Anesthesia: Choice

## 2015-08-26 MED ORDER — LACTATED RINGERS IV SOLN: INTRAVENOUS | Status: DC

## 2015-08-26 NOTE — Anesthesia Preprocedure Evaluation (Deleted)
Anesthesia Evaluation  Patient identified by MRN, date of birth, ID band Patient awake    Reviewed: Allergy & Precautions, NPO status , Patient's Chart, lab work & pertinent test results  Airway        Dental   Pulmonary former smoker,           Cardiovascular + Peripheral Vascular Disease       Neuro/Psych  Headaches, negative psych ROS   GI/Hepatic negative GI ROS, Neg liver ROS,   Endo/Other  negative endocrine ROS  Renal/GU negative Renal ROS  negative genitourinary   Musculoskeletal negative musculoskeletal ROS (+)   Abdominal   Peds negative pediatric ROS (+)  Hematology negative hematology ROS (+)   Anesthesia Other Findings   Reproductive/Obstetrics (+) Pregnancy                               Anesthesia Physical Anesthesia Plan  ASA: III  Anesthesia Plan: Spinal   Post-op Pain Management:    Induction:   Airway Management Planned:   Additional Equipment:   Intra-op Plan:   Post-operative Plan:   Informed Consent: I have reviewed the patients History and Physical, chart, labs and discussed the procedure including the risks, benefits and alternatives for the proposed anesthesia with the patient or authorized representative who has indicated his/her understanding and acceptance.   Dental advisory given  Plan Discussed with: CRNA  Anesthesia Plan Comments:         Anesthesia Quick Evaluation

## 2015-09-04 ENCOUNTER — Encounter (HOSPITAL_COMMUNITY): Payer: Self-pay | Admitting: *Deleted

## 2015-09-05 MED ORDER — SODIUM CHLORIDE 0.9 % IV SOLN
3.0000 g | Freq: Once | INTRAVENOUS | Status: AC
  Administered 2015-09-06: 3 g via INTRAVENOUS
  Filled 2015-09-05: qty 3

## 2015-09-05 MED ORDER — SODIUM CHLORIDE 0.9 % IV SOLN
3.0000 g | Freq: Once | INTRAVENOUS | Status: DC
  Filled 2015-09-05: qty 3

## 2015-09-05 NOTE — Interval H&P Note (Signed)
History and Physical Interval Note:  09/05/2015 9:00 PM  After anatomy scan and essentially normal cervix on US, CL = 3.7cm, cervical SVE = parous os, on discussion with MFM, decision made to monitor CL, cancel surgery (cerclage)  Pt presented to office for CL, had been 3.7cm, now 1.6cm, with funneling from internal os to closed portion of cervix.  D/W MFM situation, recommend cerclage and vaginal progesterone.  D/w pt r/b/a, wish to proceed.  Stopped Lovenox Wednesday AM for Friday case.   Erin Boyd  has presented today for surgery, with the diagnosis of incompentent cervix   The various methods of treatment have been discussed with the patient and family. After consideration of risks, benefits and other options for treatment, the patient has consented to  Procedure(s): CERCLAGE CERVICAL (N/A) as a surgical intervention .  The patient's history has been reviewed, patient examined, no change in status, stable for surgery.  I have reviewed the patient's chart and labs.  Questions were answered to the patient's satisfaction.     [name], [name]

## 2015-09-05 NOTE — H&P (View-Only) (Signed)
Erin Boyd is a 30 y.o. female [phone] at 4419+ presenting for rescue cerclage.  Pt had desired TAB, after laminara placed changed mind.  D/W pt with advanced cervical dilation (3cm by report) possibility of being unable to place cerclage.  Also possibility of infection and labor and delivery of fetus prior to viability.  D/W pt r/b/a of cerclage placement.  Pt with h/o LUE DVT 2/17 - On Lovenox 100mg  SQ bid.  Maternal Medical History:  Fetal activity: Perceived fetal activity is normal.      OB History    Gravida Para Term Preterm AB TAB SAB Ectopic Multiple Living   6 1 1  0 3 2 1  0 0 1    TAB x 2 SAB x 2 SVD 7#7 female  Abn pap as teen, normal since last 04/11/15 WNL  H/o Chl  Past Medical History  Diagnosis Date  . No pertinent past medical history   . Cervical abnormality affecting pregnancy, antepartum 08/25/2015  DVT LUE 2/17 Past Surgical History  Procedure Laterality Date  . Pediatric dental surgery    . Induced abortion     Family History: family history is negative for Clotting disorder. Social History:  reports that she has quit smoking. Her smoking use included Cigarettes. She smoked 0.50 packs per day. She has never used smokeless tobacco. She reports that she does not drink alcohol or use illicit drugs.  single, student Meds Lovenox 100bid SQ All Latex    Prenatal Transfer Tool  Maternal Diabetes: No Genetic Screening: Normal Maternal Ultrasounds/Referrals: Normal Fetal Ultrasounds or other Referrals:  None Maternal Substance Abuse:  No Significant Maternal Medications:  Meds include: Other: Lovenox 100mg  SQ bid Significant Maternal Lab Results:  None Other Comments:  None  Review of Systems  Constitutional: Negative.   HENT: Negative.   Eyes: Negative.   Respiratory: Negative.   Cardiovascular: Negative.   Gastrointestinal: Negative.   Genitourinary: Negative.   Musculoskeletal: Negative.   Skin: Negative.   Neurological: Negative.    Psychiatric/Behavioral: Negative.       Last menstrual period 04/11/2015. Maternal Exam:  Abdomen: Patient reports no abdominal tenderness. Fundal height is appropriate for gestation.    Introitus: Normal vulva. Normal vagina.  Cervix: Cervix evaluated by digital exam.     Physical Exam  Constitutional: She is oriented to person, place, and time. She appears well-developed and well-nourished.  HENT:  Head: Normocephalic and atraumatic.  Cardiovascular: Normal rate and regular rhythm.   Respiratory: Effort normal and breath sounds normal. No respiratory distress. She has no wheezes.  GI: Soft. Bowel sounds are normal. She exhibits no distension. There is no tenderness.  Musculoskeletal: Normal range of motion.  Neurological: She is alert and oriented to person, place, and time.  Skin: Skin is warm and dry.  Psychiatric: She has a normal mood and affect. Her behavior is normal.    Prenatal labs: ABO, Rh:  A+ Antibody:  neg Rubella:  immune RPR: NON REAC (11/16 1523)  HBsAg: NEGATIVE (11/16 1523)  HIV: NONREACTIVE (11/16 1523)  GBS:   unknown  AFP WNL/Hgb 12.9/Plt 205K/GC neg/Chl neg/Hgb electro WNL  EDC 01/16/16 - nl First Tri US, nl NT  Assessment/Plan: 29yo [id number] at 19+  With advanced cervical dilation for rescue cerclage D/w pt r/b/a and process, will proceed Stopped Lovenox for Abortion - has been off for several days Will give Unasyn in holding.   [name], Natalyn Szymanowski 08/25/2015, 8:50 PM

## 2015-09-06 ENCOUNTER — Ambulatory Visit (HOSPITAL_COMMUNITY)
Admission: RE | Admit: 2015-09-06 | Discharge: 2015-09-06 | Disposition: A | Discharge status: Home / Self Care | Payer: Medicaid Other | Source: Ambulatory Visit | Attending: Obstetrics and Gynecology | Admitting: Obstetrics and Gynecology

## 2015-09-06 ENCOUNTER — Encounter (HOSPITAL_COMMUNITY)
Admission: RE | Disposition: A | Discharge status: Home / Self Care | Payer: Self-pay | Source: Ambulatory Visit | Attending: Obstetrics and Gynecology

## 2015-09-06 ENCOUNTER — Ambulatory Visit (HOSPITAL_COMMUNITY): Payer: Medicaid Other | Admitting: Anesthesiology

## 2015-09-06 ENCOUNTER — Encounter (HOSPITAL_COMMUNITY): Payer: Self-pay | Admitting: Anesthesiology

## 2015-09-06 DIAGNOSIS — Z3A19 19 weeks gestation of pregnancy: Secondary | ICD-10-CM | POA: Insufficient documentation

## 2015-09-06 DIAGNOSIS — Z86718 Personal history of other venous thrombosis and embolism: Secondary | ICD-10-CM | POA: Diagnosis not present

## 2015-09-06 DIAGNOSIS — E669 Obesity, unspecified: Secondary | ICD-10-CM | POA: Diagnosis not present

## 2015-09-06 DIAGNOSIS — O3432 Maternal care for cervical incompetence, second trimester: Secondary | ICD-10-CM | POA: Diagnosis not present

## 2015-09-06 DIAGNOSIS — O99612 Diseases of the digestive system complicating pregnancy, second trimester: Secondary | ICD-10-CM | POA: Diagnosis not present

## 2015-09-06 DIAGNOSIS — Z6835 Body mass index (BMI) 35.0-35.9, adult: Secondary | ICD-10-CM | POA: Diagnosis not present

## 2015-09-06 DIAGNOSIS — O99212 Obesity complicating pregnancy, second trimester: Secondary | ICD-10-CM | POA: Insufficient documentation

## 2015-09-06 DIAGNOSIS — K219 Gastro-esophageal reflux disease without esophagitis: Secondary | ICD-10-CM | POA: Diagnosis not present

## 2015-09-06 DIAGNOSIS — Z87891 Personal history of nicotine dependence: Secondary | ICD-10-CM | POA: Insufficient documentation

## 2015-09-06 DIAGNOSIS — O26872 Cervical shortening, second trimester: Secondary | ICD-10-CM

## 2015-09-06 HISTORY — DX: Maternal care for cervical incompetence, second trimester: O34.32

## 2015-09-06 HISTORY — DX: Cervical shortening, second trimester: O26.872

## 2015-09-06 HISTORY — PX: CERVICAL CERCLAGE: SHX1329

## 2015-09-06 LAB — CBC
HCT: 34.1 % — ABNORMAL LOW (ref 36.0–46.0)
Hemoglobin: 11.7 g/dL — ABNORMAL LOW (ref 12.0–15.0)
MCH: 29.8 pg (ref 26.0–34.0)
MCHC: 34.3 g/dL (ref 30.0–36.0)
MCV: 86.8 fL (ref 78.0–100.0)
Platelets: 165 10*3/uL (ref 150–400)
RBC: 3.93 MIL/uL (ref 3.87–5.11)
RDW: 14.1 % (ref 11.5–15.5)
WBC: 9.9 10*3/uL (ref 4.0–10.5)

## 2015-09-06 SURGERY — CERCLAGE, CERVIX, VAGINAL APPROACH
Anesthesia: General | Site: Vagina

## 2015-09-06 MED ORDER — LACTATED RINGERS IV SOLN: INTRAVENOUS | Status: DC

## 2015-09-06 MED ORDER — PHENYLEPHRINE 40 MCG/ML (10ML) SYRINGE FOR IV PUSH (FOR BLOOD PRESSURE SUPPORT)
PREFILLED_SYRINGE | INTRAVENOUS | Status: AC
  Filled 2015-09-06: qty 10

## 2015-09-06 MED ORDER — FENTANYL CITRATE (PF) 100 MCG/2ML IJ SOLN
INTRAMUSCULAR | Status: AC
  Filled 2015-09-06: qty 2

## 2015-09-06 MED ORDER — LACTATED RINGERS IV SOLN
INTRAVENOUS | Status: DC
  Administered 2015-09-06 (×2): via INTRAVENOUS

## 2015-09-06 MED ORDER — PROMETHAZINE HCL 25 MG/ML IJ SOLN: 6.2500 mg | INTRAMUSCULAR | Status: DC | PRN

## 2015-09-06 MED ORDER — MEPERIDINE HCL 25 MG/ML IJ SOLN: 6.2500 mg | INTRAMUSCULAR | Status: DC | PRN

## 2015-09-06 MED ORDER — FENTANYL CITRATE (PF) 100 MCG/2ML IJ SOLN: 25.0000 ug | INTRAMUSCULAR | Status: DC | PRN

## 2015-09-06 MED ORDER — BUPIVACAINE IN DEXTROSE 0.75-8.25 % IT SOLN
INTRATHECAL | Status: DC | PRN
  Administered 2015-09-06: 1.6 mL via INTRATHECAL

## 2015-09-06 MED ORDER — LACTATED RINGERS IV SOLN
INTRAVENOUS | Status: DC
Start: 2015-09-06 — End: 2015-09-06

## 2015-09-06 MED ORDER — PHENYLEPHRINE HCL 10 MG/ML IJ SOLN
INTRAMUSCULAR | Status: DC | PRN
  Administered 2015-09-06: .08 mg via INTRAVENOUS

## 2015-09-06 MED ORDER — ONDANSETRON HCL 4 MG/2ML IJ SOLN
INTRAMUSCULAR | Status: DC | PRN
  Administered 2015-09-06: 4 mg via INTRAVENOUS

## 2015-09-06 MED ORDER — SCOPOLAMINE 1 MG/3DAYS TD PT72
MEDICATED_PATCH | TRANSDERMAL | Status: AC
  Administered 2015-09-06: 1.5 mg via TRANSDERMAL
  Filled 2015-09-06: qty 1

## 2015-09-06 MED ORDER — SCOPOLAMINE 1 MG/3DAYS TD PT72
1.0000 | MEDICATED_PATCH | Freq: Once | TRANSDERMAL | Status: DC
  Administered 2015-09-06: 1.5 mg via TRANSDERMAL

## 2015-09-06 MED ORDER — FENTANYL CITRATE (PF) 100 MCG/2ML IJ SOLN
INTRAMUSCULAR | Status: DC | PRN
  Administered 2015-09-06: 100 ug via INTRAVENOUS

## 2015-09-06 SURGICAL SUPPLY — 22 items
CANISTER SUCT 3000ML (MISCELLANEOUS) ×3 IMPLANT
CLOTH BEACON ORANGE TIMEOUT ST (SAFETY) ×3 IMPLANT
COUNTER NEEDLE 1200 MAGNETIC (NEEDLE) IMPLANT
GLOVE BIO SURGEON STRL SZ 6.5 (GLOVE) ×2 IMPLANT
GLOVE BIO SURGEONS STRL SZ 6.5 (GLOVE) ×1
GLOVE BIOGEL PI IND STRL 7.0 (GLOVE) ×1 IMPLANT
GLOVE BIOGEL PI INDICATOR 7.0 (GLOVE) ×2
GOWN STRL REUS W/TWL LRG LVL3 (GOWN DISPOSABLE) ×6 IMPLANT
NDL MAYO CATGUT SZ4 TPR NDL (NEEDLE) ×1 IMPLANT
NEEDLE MAYO CATGUT SZ4 (NEEDLE) ×3 IMPLANT
NS IRRIG 1000ML POUR BTL (IV SOLUTION) ×3 IMPLANT
PACK VAGINAL MINOR WOMEN LF (CUSTOM PROCEDURE TRAY) ×3 IMPLANT
PAD OB MATERNITY 4.3X12.25 (PERSONAL CARE ITEMS) ×3 IMPLANT
PAD PREP 24X48 CUFFED NSTRL (MISCELLANEOUS) ×3 IMPLANT
SUT PROLENE 1 CTX 30  8455H (SUTURE) ×4
SUT PROLENE 1 CTX 30 8455H (SUTURE) ×2 IMPLANT
TOWEL OR 17X24 6PK STRL BLUE (TOWEL DISPOSABLE) ×6 IMPLANT
TRAY FOLEY CATH SILVER 14FR (SET/KITS/TRAYS/PACK) ×3 IMPLANT
TUBING NON-CON 1/4 X 20 CONN (TUBING) ×2 IMPLANT
TUBING NON-CON 1/4 X 20' CONN (TUBING) ×1
WATER STERILE IRR 1000ML POUR (IV SOLUTION) ×3 IMPLANT
YANKAUER SUCT BULB TIP NO VENT (SUCTIONS) ×3 IMPLANT

## 2015-09-06 NOTE — Transfer of Care (Signed)
Immediate Anesthesia Transfer of Care Note  Patient: Erin Boyd  Procedure(s) Performed: Procedure(s): CERCLAGE CERVICAL (N/A)  Patient Location: PACU  Anesthesia Type:Spinal  Level of Consciousness: awake, alert , oriented and patient cooperative  Airway & Oxygen Therapy: Patient Spontanous Breathing  Post-op Assessment: Report given to RN and Post -op Vital signs reviewed and stable  Post vital signs: Reviewed and stable  Last Vitals:  Filed Vitals:   09/06/15 1019  BP: 109/68  Pulse: 83  Temp: 36.7 C  Resp: 18    Complications: No apparent anesthesia complications

## 2015-09-06 NOTE — Interval H&P Note (Signed)
History and Physical Interval Note:  09/06/2015 11:18 AM  Erin Boyd  has presented today for surgery, with the diagnosis of incompentent cervix   The various methods of treatment have been discussed with the patient and family. After consideration of risks, benefits and other options for treatment, the patient has consented to  Procedure(s): CERCLAGE CERVICAL (N/A) as a surgical intervention .  The patient's history has been reviewed, patient examined, no change in status, stable for surgery.  I have reviewed the patient's chart and labs.  Questions were answered to the patient's satisfaction.     [name], Raine Elsass

## 2015-09-06 NOTE — Brief Op Note (Signed)
09/06/2015  12:23 PM  PATIENT:  Morrie SheldonAshley A Miley-Muhammad  30 y.o. female  PRE-OPERATIVE DIAGNOSIS:  incompentent cervix   POST-OPERATIVE DIAGNOSIS:  incompetent cervix  PROCEDURE:  Procedure(s): CERCLAGE CERVICAL (N/A) McDonald, knot at 12 o'clock  SURGEON:  Surgeon(s) and Role:    * Sherian ReinJody [name], MD - Primary  ANESTHESIA:   spinal  EBL:  Total I/O In: 1000 [I.V.:1000] Out: 75 [Urine:50; Blood:25]  BLOOD ADMINISTERED:none  DRAINS: Urinary Catheter (Foley)   LOCAL MEDICATIONS USED:  NONE  SPECIMEN:  No Specimen  DISPOSITION OF SPECIMEN:  N/A  COUNTS:  YES  TOURNIQUET:  * No tourniquets in log *  DICTATION: .Other Dictation: Dictation Number [id number]  PLAN OF CARE: Discharge to home after PACU  PATIENT DISPOSITION:  PACU - hemodynamically stable.   Delay start of Pharmacological VTE agent (>24hrs) due to surgical blood loss or risk of bleeding: not applicable

## 2015-09-06 NOTE — Discharge Instructions (Signed)
Cervical Cerclage, Care After °Refer to this sheet in the next few weeks. These instructions provide you with information on caring for yourself after your procedure. Your health care provider may also give you more specific instructions. Your treatment has been planned according to current medical practices, but problems sometimes occur. Call your health care provider if you have any problems or questions after your procedure. °WHAT TO EXPECT AFTER THE PROCEDURE  °After your procedure, it is typical to have the following: °· Abdominal cramping. °· Vaginal spotting. °HOME CARE INSTRUCTIONS  °· Only take over-the-counter or prescription medicines for pain, discomfort, or fever as directed by your health care provider. °· Avoid physical activities and exercise until your health care provider says it is okay. °· Do not douche or have sexual intercourse until your health care provider tells you it is okay. °· Keep your follow-up surgical and prenatal appointments with your health care provider. °SEEK MEDICAL CARE IF:  °· You have abnormal vaginal discharge. °· You have a rash. °· You become lightheaded or feel faint. °· You have abdominal pain that is not controlled with pain medicine. °SEEK IMMEDIATE MEDICAL CARE IF:  °· You develop vaginal bleeding. °· You are leaking fluid or have a gush of fluid from the vagina. °· You have a fever. °· You faint. °· You have uterine contractions. °· You feel your baby is not moving as much as usual, or you cannot feel your baby move. °· You have chest pain or shortness of breath. °  °This information is not intended to replace advice given to you by your health care provider. Make sure you discuss any questions you have with your health care provider. °  °Document Released: 03/15/2013 Document Revised: 05/30/2013 Document Reviewed: 03/15/2013 °Elsevier Interactive Patient Education ©2016 [employer] ° °

## 2015-09-06 NOTE — Anesthesia Preprocedure Evaluation (Addendum)
Anesthesia Evaluation  Patient identified by MRN, date of birth, ID band Patient awake    Reviewed: Allergy & Precautions, NPO status , Patient's Chart, lab work & pertinent test results  Airway Mallampati: III  TM Distance: >3 FB Neck ROM: Full    Dental no notable dental hx. (+) Chipped,    Pulmonary former smoker,    Pulmonary exam normal breath sounds clear to auscultation       Cardiovascular + Peripheral Vascular Disease  Normal cardiovascular exam Rhythm:Regular Rate:Normal  DVT right UE   Neuro/Psych  Headaches, negative psych ROS   GI/Hepatic Neg liver ROS, GERD  Medicated and Controlled,  Endo/Other  Obesity  Renal/GU negative Renal ROS  negative genitourinary   Musculoskeletal negative musculoskeletal ROS (+)   Abdominal (+) + obese,   Peds  Hematology On Lovenox- DVT RUE 06/2015   Anesthesia Other Findings   Reproductive/Obstetrics (+) Pregnancy Incompetent Cervix                            Anesthesia Physical Anesthesia Plan  ASA: II  Anesthesia Plan: General and Spinal   Post-op Pain Management:    Induction:   Airway Management Planned: Natural Airway  Additional Equipment:   Intra-op Plan:   Post-operative Plan:   Informed Consent: I have reviewed the patients History and Physical, chart, labs and discussed the procedure including the risks, benefits and alternatives for the proposed anesthesia with the patient or authorized representative who has indicated his/her understanding and acceptance.   Dental advisory given  Plan Discussed with: CRNA, Anesthesiologist and Surgeon  Anesthesia Plan Comments:         Anesthesia Quick Evaluation

## 2015-09-06 NOTE — Anesthesia Procedure Notes (Signed)
Spinal Patient location during procedure: OR Start time: 09/06/2015 11:29 AM Staffing Anesthesiologist: Mal AmabileFOSTER, Tyion Boylen Performed by: anesthesiologist  Preanesthetic Checklist Completed: patient identified, site marked, surgical consent, pre-op evaluation, timeout performed, IV checked, risks and benefits discussed and monitors and equipment checked Spinal Block Patient position: sitting Prep: site prepped and draped and DuraPrep Patient monitoring: heart rate, cardiac monitor, continuous pulse ox and blood pressure Approach: midline Location: L3-4 Injection technique: single-shot Needle Needle type: Sprotte  Needle gauge: 24 G Needle length: 9 cm Needle insertion depth: 7 cm Assessment Sensory level: T4 Additional Notes Patient tolerated procedure well. Adequate sensory level.

## 2015-09-06 NOTE — Anesthesia Postprocedure Evaluation (Signed)
Anesthesia Post Note  Patient: Erin Boyd  Procedure(s) Performed: Procedure(s) (LRB): CERCLAGE CERVICAL (N/A)  Patient location during evaluation: PACU Anesthesia Type: Spinal Level of consciousness: awake and alert and oriented Pain management: pain level controlled Vital Signs Assessment: post-procedure vital signs reviewed and stable Respiratory status: spontaneous breathing, nonlabored ventilation and respiratory function stable Cardiovascular status: blood pressure returned to baseline and stable Postop Assessment: no signs of nausea or vomiting, spinal receding, patient able to bend at knees, no backache and no headache Anesthetic complications: no    Last Vitals:  Filed Vitals:   09/06/15 1400 09/06/15 1415  BP: 108/52 118/70  Pulse: 88 74  Temp:  36.6 C  Resp: 17 15    Last Pain: There were no vitals filed for this visit.  LLE Motor Response: Purposeful movement (09/06/15 1415) LLE Sensation: Increased (09/06/15 1415) RLE Motor Response: Purposeful movement (09/06/15 1415) RLE Sensation: Increased;Tingling (09/06/15 1415)      Christin Mccreedy A.

## 2015-09-07 NOTE — Op Note (Signed)
NAMOrma Flaming:  MILEY-MUHAMMAD, Tiondra       ACCOUNT NO.:  [id number]  MEDICAL RECORD NO.:  [id number]  LOCATION:  [facility]                          FACILITY:  [facility]  PHYSICIAN:  Sherron MondayJody Bovard, MD        DATE OF BIRTH:  [birth year]  DATE OF PROCEDURE:  09/06/2015 DATE OF DISCHARGE:  09/06/2015                              OPERATIVE REPORT   PREOPERATIVE DIAGNOSES:  Incompetent cervix s/p laminaria placement for a therapeutic abortion when she changed her mind.  The cervical length has been followed was 3.7, has decreased to 1.6 with funneling.  Postoperative 3.7 cm to 1.6 cm with funneling.  POSTOPERATIVE DIAGNOSES:  Incompetent cervix s/p laminaria placement for a therapeutic abortion when she changed her mind.  The cervical length has been followed was 3.7, has decreased to 1.6 with funneling.  Postoperative 3.7 cm to 1.6 cm with funneling, status post McDonald cerclage.  PROCEDURE:  Cervical cerclage, McDonald knot at 12 o'clock.  ANESTHESIA:  Spinal.  IV FLUIDS:  1000 mL.  URINE OUTPUT:  50 mL.  EBL:  Approximately 25 mL.  PATHOLOGY:  None.  COMPLICATIONS:  None.  PROCEDURE:  After informed consent was reviewed with the patient.  She was transported to the operating room, placed on the table in supine position.  She was then placed in the [place] stirrups and prepped and draped in the normal sterile fashion after spinal anesthesia was confirmed to be adequate.  Her cervix was easily visualized and grasped with open sponge sticks McDonald cerclage was placed at approximately a centimeter and a half going from 12 to 10, 10 to 7, 7 to 4, 4 to 2, and 2 back to 12, this was tied and the suture was left long for ability to find out later.  This was noted to be hemostatic after the procedure was done, she was returned to the supine position.  All sponge, lap, and needle counts were correct x2 per operating room staff.  The patient tolerated the procedure well.    Sherron MondayJody Bovard,  MD    JB/MEDQ  D:  09/06/2015  T:  09/07/2015  Job:  [id number]

## 2015-09-09 ENCOUNTER — Encounter (HOSPITAL_COMMUNITY): Payer: Self-pay | Admitting: Obstetrics and Gynecology

## 2015-10-09 ENCOUNTER — Inpatient Hospital Stay (HOSPITAL_COMMUNITY)
Admission: AD | Admit: 2015-10-09 | Discharge: 2015-11-09 | DRG: 765 | Disposition: A | Discharge status: Home / Self Care | Payer: Medicaid Other | Source: Ambulatory Visit | Attending: Obstetrics and Gynecology | Admitting: Obstetrics and Gynecology

## 2015-10-09 ENCOUNTER — Inpatient Hospital Stay (HOSPITAL_COMMUNITY): Payer: Medicaid Other

## 2015-10-09 ENCOUNTER — Encounter (HOSPITAL_COMMUNITY): Payer: Self-pay | Admitting: *Deleted

## 2015-10-09 DIAGNOSIS — Z87891 Personal history of nicotine dependence: Secondary | ICD-10-CM | POA: Diagnosis not present

## 2015-10-09 DIAGNOSIS — O3433 Maternal care for cervical incompetence, third trimester: Secondary | ICD-10-CM | POA: Diagnosis present

## 2015-10-09 DIAGNOSIS — O99824 Streptococcus B carrier state complicating childbirth: Secondary | ICD-10-CM | POA: Diagnosis present

## 2015-10-09 DIAGNOSIS — I82621 Acute embolism and thrombosis of deep veins of right upper extremity: Secondary | ICD-10-CM | POA: Diagnosis present

## 2015-10-09 DIAGNOSIS — Z3A25 25 weeks gestation of pregnancy: Secondary | ICD-10-CM

## 2015-10-09 DIAGNOSIS — O411230 Chorioamnionitis, third trimester, not applicable or unspecified: Secondary | ICD-10-CM | POA: Diagnosis present

## 2015-10-09 DIAGNOSIS — O42112 Preterm premature rupture of membranes, onset of labor more than 24 hours following rupture, second trimester: Secondary | ICD-10-CM | POA: Diagnosis present

## 2015-10-09 DIAGNOSIS — O871 Deep phlebothrombosis in the puerperium: Secondary | ICD-10-CM | POA: Diagnosis present

## 2015-10-09 DIAGNOSIS — Z3A29 29 weeks gestation of pregnancy: Secondary | ICD-10-CM | POA: Diagnosis not present

## 2015-10-09 DIAGNOSIS — O42113 Preterm premature rupture of membranes, onset of labor more than 24 hours following rupture, third trimester: Secondary | ICD-10-CM

## 2015-10-09 DIAGNOSIS — O3432 Maternal care for cervical incompetence, second trimester: Secondary | ICD-10-CM

## 2015-10-09 DIAGNOSIS — O26872 Cervical shortening, second trimester: Secondary | ICD-10-CM | POA: Diagnosis present

## 2015-10-09 DIAGNOSIS — O321XX0 Maternal care for breech presentation, not applicable or unspecified: Secondary | ICD-10-CM | POA: Diagnosis present

## 2015-10-09 DIAGNOSIS — O42919 Preterm premature rupture of membranes, unspecified as to length of time between rupture and onset of labor, unspecified trimester: Secondary | ICD-10-CM | POA: Diagnosis present

## 2015-10-09 LAB — CBC
HCT: 32 % — ABNORMAL LOW (ref 36.0–46.0)
Hemoglobin: 10.9 g/dL — ABNORMAL LOW (ref 12.0–15.0)
MCH: 29.3 pg (ref 26.0–34.0)
MCHC: 34.1 g/dL (ref 30.0–36.0)
MCV: 86 fL (ref 78.0–100.0)
PLATELETS: 173 10*3/uL (ref 150–400)
RBC: 3.72 MIL/uL — AB (ref 3.87–5.11)
RDW: 13.2 % (ref 11.5–15.5)
WBC: 10.6 10*3/uL — ABNORMAL HIGH (ref 4.0–10.5)

## 2015-10-09 LAB — TYPE AND SCREEN
ABO/RH(D): A POS
ANTIBODY SCREEN: NEGATIVE

## 2015-10-09 MED ORDER — LACTATED RINGERS IV SOLN
INTRAVENOUS | Status: DC
  Administered 2015-10-09 – 2015-10-10 (×4): via INTRAVENOUS

## 2015-10-09 MED ORDER — AMOXICILLIN 500 MG PO CAPS
500.0000 mg | ORAL_CAPSULE | Freq: Three times a day (TID) | ORAL | Status: AC
  Administered 2015-10-11 – 2015-10-16 (×15): 500 mg via ORAL
  Filled 2015-10-09 (×15): qty 1

## 2015-10-09 MED ORDER — HYDROXYPROGESTERONE CAPROATE 250 MG/ML IM OIL
250.0000 mg | TOPICAL_OIL | INTRAMUSCULAR | Status: DC
  Administered 2015-10-09 – 2015-10-30 (×4): 250 mg via INTRAMUSCULAR
  Filled 2015-10-09 (×4): qty 1

## 2015-10-09 MED ORDER — DOCUSATE SODIUM 100 MG PO CAPS
100.0000 mg | ORAL_CAPSULE | Freq: Every day | ORAL | Status: DC
  Administered 2015-10-10 – 2015-11-05 (×27): 100 mg via ORAL
  Filled 2015-10-09 (×27): qty 1

## 2015-10-09 MED ORDER — BETAMETHASONE SOD PHOS & ACET 6 (3-3) MG/ML IJ SUSP
12.0000 mg | INTRAMUSCULAR | Status: AC
  Administered 2015-10-09 – 2015-10-10 (×2): 12 mg via INTRAMUSCULAR
  Filled 2015-10-09 (×2): qty 2

## 2015-10-09 MED ORDER — ACETAMINOPHEN 325 MG PO TABS
650.0000 mg | ORAL_TABLET | ORAL | Status: DC | PRN
  Administered 2015-10-12 – 2015-11-05 (×5): 650 mg via ORAL
  Filled 2015-10-09 (×5): qty 2

## 2015-10-09 MED ORDER — ENOXAPARIN SODIUM 100 MG/ML ~~LOC~~ SOLN
100.0000 mg | SUBCUTANEOUS | Status: DC
Start: 2015-10-09 — End: 2015-10-11
  Administered 2015-10-09 – 2015-10-10 (×2): 100 mg via SUBCUTANEOUS
  Filled 2015-10-09 (×2): qty 1

## 2015-10-09 MED ORDER — PRENATAL MULTIVITAMIN CH
1.0000 | ORAL_TABLET | Freq: Every day | ORAL | Status: DC
  Administered 2015-10-10 – 2015-11-05 (×27): 1 via ORAL
  Filled 2015-10-09 (×27): qty 1

## 2015-10-09 MED ORDER — SODIUM CHLORIDE 0.9 % IV SOLN
2.0000 g | Freq: Four times a day (QID) | INTRAVENOUS | Status: AC
  Administered 2015-10-09 – 2015-10-11 (×8): 2 g via INTRAVENOUS
  Filled 2015-10-09 (×8): qty 2000

## 2015-10-09 MED ORDER — ERYTHROMYCIN BASE 250 MG PO TABS
250.0000 mg | ORAL_TABLET | Freq: Four times a day (QID) | ORAL | Status: AC
  Administered 2015-10-11 – 2015-10-16 (×20): 250 mg via ORAL
  Filled 2015-10-09 (×20): qty 1

## 2015-10-09 MED ORDER — CALCIUM CARBONATE ANTACID 500 MG PO CHEW: 2.0000 | CHEWABLE_TABLET | ORAL | Status: DC | PRN

## 2015-10-09 MED ORDER — SODIUM CHLORIDE 0.9 % IV SOLN
250.0000 mg | Freq: Four times a day (QID) | INTRAVENOUS | Status: AC
  Administered 2015-10-09 – 2015-10-11 (×8): 250 mg via INTRAVENOUS
  Filled 2015-10-09 (×8): qty 5

## 2015-10-09 MED ORDER — ZOLPIDEM TARTRATE 5 MG PO TABS
5.0000 mg | ORAL_TABLET | Freq: Every evening | ORAL | Status: DC | PRN
  Administered 2015-10-10 – 2015-11-03 (×6): 5 mg via ORAL
  Filled 2015-10-09 (×6): qty 1

## 2015-10-09 MED ORDER — CALCIUM CARBONATE ANTACID 500 MG PO CHEW
2.0000 | CHEWABLE_TABLET | ORAL | Status: DC | PRN
  Administered 2015-10-13 – 2015-11-04 (×5): 400 mg via ORAL
  Filled 2015-10-09 (×10): qty 2

## 2015-10-09 MED ORDER — ENOXAPARIN SODIUM 40 MG/0.4ML ~~LOC~~ SOLN: 40.0000 mg | SUBCUTANEOUS | Status: DC

## 2015-10-09 NOTE — Progress Notes (Addendum)
Patient ID: Erin Boyd, female   DOB: 05/05/1986, 30 y.o.   MRN: [id number]  US renal system dilation, Breech  D/W pt with labor or infection, delivery via C/S  Nl AFI  Repeat US in 4 week  Pt with cerclage placed for shortened cervix.  D/w pt removal as it is risk for infection.  Will reexamin situation after BMZ x 2.

## 2015-10-09 NOTE — Consult Note (Signed)
[facility]  Prenatal Consult       10/09/2015  5:14 PM   I was asked by Dr. Hinton RaoBovard-Stuckert to consult on this patient for possible preterm delivery.  I had the pleasure of meeting with [name] today.  She is a 30 y/o [id number] at 825 and 6/[redacted] weeks gestation who was admitted today for PPROM this morning.  Her pregnancy has been complicated by a right upper extremity DVT for which she is on prophylactic Lovenox.  She had planned TAB at 19 weeks, and had laminaria (helps the cervix to dilate prior to TAB procedure) placed, left for 24 hrs, then changed mind. Cervix shortened to 1.6 cm, a cerclage has been placed and she is on vaginal progesterone.  She has been admitted to receive Betamethasone for fetal lung maturity, Magnesium for neuro protection, and latency antibiotics.  The fetus is a female and prenatal labs are as follows:  ABO, Rh:  A+ Antibody:  neg Rubella:  immune RPR: NON REAC (11/16 1523)  HBsAg: NEGATIVE (11/16 1523)  HIV: NONREACTIVE (11/16 1523)  GBS:   unknown  I explained that we would universally recommend resuscitating a typical infant >[redacted] weeks gestation because the chance of survival with a good outcome is so high.  I explained that the neonatal intensive care team would be present for the delivery and outlined the likely delivery room course for this baby including routine resuscitation and NRP-guided approaches to the treatment of respiratory distress. We discussed other common problems associated with prematurity including respiratory distress syndrome/CLD, apnea, feeding issues, temperature regulation, and infection risk.  We briefly discussed IVH/PVL, ROP, and NEC, and that if her pregnancy continues through [redacted] weeks gestation, these complications are uncommon.    We discussed the average length of stay but I noted that the actual LOS would depend on the severity of problems encountered and response to treatments.  We discussed visitation  policies and the resources available while her child is in the hospital.  We discussed the importance of good nutrition and various methods of providing nutrition (parenteral hyperalimentation, gavage feedings and/or oral feeding). We discussed the benefits of human milk. I encouraged breast feeding and pumping soon after birth and outlined resources that are available to support breast feeding.   Thank you for involving us in the care of this patient. A member of our team will be available should the family have additional questions.  Time for consultation approximately 45 minutes.   _____________________ Electronically Signed By: Maryan CharLindsey Michaeal Davis, MD Neonatologist

## 2015-10-09 NOTE — H&P (Signed)
Mathews Robinsonsshley A Miley-Muhammad is a 30 y.o. female [phone] at 25+6 with ROM.  States increased leaking this AM in class.  Has continued.  Rare ctx, no odor or blood.  Pt with pregnancy complicated by RUE DVT - On prophylactic Lovenox 100mg  qd.  Also with planned TAB at 19+ week, had laminara placed, left for 24 hrs, then changed mind.  Cervix monitored and stable.  Cervix shortened to 1.6 cm, cerclage placed.  On vaginal progesterone.  ROM confirmed in office w pool, nitrazine and ferning.  Admitted for BMZ and Amp and erythro.      Maternal Medical History:  Reason for admission: Rupture of membranes.   Contractions: Frequency: rare.    Fetal activity: Perceived fetal activity is normal.    Prenatal Complications - Diabetes: none.    OB History    Gravida Para Term Preterm AB TAB SAB Ectopic Multiple Living   6 1 1  0 3 2 1  0 0 1    G1 SVD 7#7, female SAB x 2, TAB x 2  G6 present  Last pap 11/16, WNL; abn in teens H/o Chl  Past Medical History  Diagnosis Date  . No pertinent past medical history   . Cervical abnormality affecting pregnancy, antepartum 08/25/2015  . Heartburn in pregnancy   . Headache     otc med prn  . DVT (deep vein thrombosis) in pregnancy     dx clot in right arm in Jan 2017.  Been off Lovenox since Friday 08/23/15   . Short cervix with cervical cerclage in second trimester, antepartum 09/06/2015   Past Surgical History  Procedure Laterality Date  . Pediatric dental surgery    . Induced abortion    . Wisdom tooth extraction    . Cervical cerclage N/A 09/06/2015    Procedure: CERCLAGE CERVICAL;  Surgeon: Sherian ReinJody [name], MD;  Location: [facility];  Service: Gynecology;  Laterality: N/A;   Family History: family history is negative for Clotting disorder. Social History:  reports that she quit smoking about 16 months ago. Her smoking use included Cigarettes. She has a 3.5 pack-year smoking history. She has never used smokeless tobacco. She reports that she drinks  alcohol. She reports that she does not use illicit drugs. Meds PNV, Lovenox, vag progesterone All Latex  Prenatal Transfer Tool  Maternal Diabetes: No Genetic Screening: Normal Maternal Ultrasounds/Referrals: Normal Fetal Ultrasounds or other Referrals:  None Maternal Substance Abuse:  No Significant Maternal Medications:  Meds include: Progesterone Other: Lovenox Significant Maternal Lab Results:  None Other Comments:  hypercoag w/u neg, cerclage for shortened cervix after laminara  Review of Systems  Constitutional: Negative.   HENT: Negative.   Eyes: Negative.   Respiratory: Negative.   Cardiovascular: Negative.   Gastrointestinal: Negative.   Genitourinary: Negative.   Musculoskeletal: Negative.   Skin: Negative.   Neurological: Negative.   Psychiatric/Behavioral: Negative.       Blood pressure 106/66, pulse 90, temperature 97.8 F (36.6 C), temperature source Oral, resp. rate 20, height 5' 9.5" (1.765 m), weight 113.399 kg (250 lb), last menstrual period 04/11/2015. Maternal Exam:  Abdomen: Patient reports no abdominal tenderness. Fundal height is appropriate for gestation - 25.5 cm in office.   Estimated fetal weight is 1.5lb, transverse presentation.    Introitus: Normal vulva. Normal vagina.  Cervix: Cervix evaluated by sterile speculum exam.     Physical Exam  Constitutional: She is oriented to person, place, and time. She appears well-developed and well-nourished.  HENT:  Head: Normocephalic and atraumatic.  Cardiovascular: Normal rate and regular rhythm.   Respiratory: Effort normal and breath sounds normal. No respiratory distress. She has no wheezes.  GI: Soft. Bowel sounds are normal. She exhibits no distension. There is no tenderness.  Musculoskeletal: Normal range of motion.  Neurological: She is alert and oriented to person, place, and time.  Skin: Skin is warm and dry.  Psychiatric: She has a normal mood and affect. Her behavior is normal.     Prenatal labs: ABO, Rh:  A+ Antibody:  neg Rubella:  immune RPR: NON REAC (11/16 1523)  HBsAg: NEGATIVE (11/16 1523)  HIV: NONREACTIVE (11/16 1523)  GBS:   unknown  AFP WNL, Hgb 12.5/Plt 205/Ur Cx neg/GC neg/Chl neg/Varicella immune/Hgb electro WNL/ Pap WNL/ ACA, APA, Prothrobin, factor V, AT3 neg/WNL  Post plac, nl AFI, nl anat, post plac Cervix 3.5, shortened to 1.6cm, after cerclage back to 1.6   Assessment/Plan: 29yo [id number] at 25+6 with ROM Amp/Erythro for latency BMZ for lung maturity Mg for cp prophylaxis prn close monitoring BS  [place] transverse    [name], Kalenna Millett 10/09/2015, 1:57 PM

## 2015-10-10 NOTE — Progress Notes (Signed)
Patient ID: Erin Boyd, female   DOB: 12/27/1985, 30 y.o.   MRN: [id number]  PPROM 26+0, also cerclage - getting BMZ and Amp/Erythro  No c/o's.  +FM, cont LOF, no VB, rare ctx/cramp  AFVSS gen NAD Abd gravid, NT    FHTs 140's, good var toco none  Continue current mgmt Breech by [place]

## 2015-10-11 LAB — CULTURE, BETA STREP (GROUP B ONLY)

## 2015-10-11 MED ORDER — ENOXAPARIN SODIUM 60 MG/0.6ML ~~LOC~~ SOLN
60.0000 mg | SUBCUTANEOUS | Status: DC
Start: 2015-10-11 — End: 2015-11-06
  Administered 2015-10-11 – 2015-11-04 (×25): 60 mg via SUBCUTANEOUS
  Filled 2015-10-11 (×26): qty 0.6

## 2015-10-11 MED ORDER — SODIUM CHLORIDE 0.9% FLUSH
3.0000 mL | Freq: Two times a day (BID) | INTRAVENOUS | Status: DC
  Administered 2015-10-11 – 2015-10-18 (×15): 3 mL via INTRAVENOUS

## 2015-10-11 MED ORDER — SODIUM CHLORIDE 0.9% FLUSH: 3.0000 mL | INTRAVENOUS | Status: DC | PRN

## 2015-10-11 NOTE — Progress Notes (Signed)
Patient ID: Erin Boyd, female   DOB: 12/30/1985, 30 y.o.   MRN: [id number]  26+1  No c/o's.  +FM, cont LOF, no VB, rare ctx  AFVSS gen NAD FHTs 140-150, good var (for gestational age), category 1 toco none  S/p BMZ, weekly delalutein, once daily Lovenox, day 2/7 Ampicilllin and Erythro Continue current mgmt T/C removal of cerclage Saline lock IV b/w antibiotics.

## 2015-10-11 NOTE — Progress Notes (Signed)
Spoke with Dr. Ellyn HackBovard regarding the Lovenox plan.  Per Hematology note on 08/07/15 Dr. Candise CheKale, will change to prophylaxis dosing of 0.5mg /kg daily due to pts BMI of 36.2.   Hurley CiscoMendenhall, Hoang Reich D, Pharm.D.

## 2015-10-11 NOTE — Progress Notes (Signed)
Pt was lying in bed and awake when I arrived. She said she was doing ok today and explained that she was here to try to keep baby from coming. She said she misses her 28 yr old daughter, but is otherwise ok. CH encouraged her to request support if needed. Pt grateful for visit. Please page if additional spt is needed. Chaplain Marjory LiesPamela Carrington Holder, M.Div.   10/11/15 1100  Clinical Encounter Type  Visited With Patient

## 2015-10-12 LAB — CBC
HEMATOCRIT: 29.4 % — AB (ref 36.0–46.0)
HEMOGLOBIN: 9.7 g/dL — AB (ref 12.0–15.0)
MCH: 28.8 pg (ref 26.0–34.0)
MCHC: 33 g/dL (ref 30.0–36.0)
MCV: 87.2 fL (ref 78.0–100.0)
Platelets: 178 10*3/uL (ref 150–400)
RBC: 3.37 MIL/uL — ABNORMAL LOW (ref 3.87–5.11)
RDW: 13.4 % (ref 11.5–15.5)
WBC: 14 10*3/uL — ABNORMAL HIGH (ref 4.0–10.5)

## 2015-10-12 LAB — OB RESULTS CONSOLE GBS: GBS: POSITIVE

## 2015-10-12 LAB — TYPE AND SCREEN
ABO/RH(D): A POS
Antibody Screen: NEGATIVE

## 2015-10-12 NOTE — Progress Notes (Addendum)
Patient ID: Erin Boyd, female   DOB: 01/05/1986, 30 y.o.   MRN: [id number] Pt sleeping - easily arousable. Daughter with her in room. No complaints.  VSS-afeb ABD- soft, c/w ga EXT - no Homans  A/P:HD# 4;         26 2/[redacted]wks ga with PPROM, shortened cervix - cerclage in place and to remain per MFM        S/p BMZ, weekly delalutein, once daily Lovenox, day 3/7 Ampicilllin and Erythro        Continue current mgmt        Saline lock         Check cbc

## 2015-10-13 LAB — CBC
HEMATOCRIT: 30.2 % — AB (ref 36.0–46.0)
HEMOGLOBIN: 10.3 g/dL — AB (ref 12.0–15.0)
MCH: 29.8 pg (ref 26.0–34.0)
MCHC: 34.1 g/dL (ref 30.0–36.0)
MCV: 87.3 fL (ref 78.0–100.0)
Platelets: 170 10*3/uL (ref 150–400)
RBC: 3.46 MIL/uL — ABNORMAL LOW (ref 3.87–5.11)
RDW: 13.6 % (ref 11.5–15.5)
WBC: 12.3 10*3/uL — ABNORMAL HIGH (ref 4.0–10.5)

## 2015-10-13 NOTE — Progress Notes (Signed)
Patient ID: Erin Boyd, female   DOB: 05/30/1986, 30 y.o.   MRN: [id number] Wbc down from 14 to 12  Continue current care

## 2015-10-13 NOTE — Progress Notes (Signed)
Patient ID: Erin Robinsonsshley A Miley-Muhammad, female   DOB: 02/21/1986, 30 y.o.   MRN: [id number] Pt doing well. Denies any fever, chills or pain. Small episodes of LOF. +Fms. Feels well VSS ABD- c/w ga; nontender EXT - no homans  WBC - 14 (5/6)  A/P: HD# 5;   26 3/[redacted]wks ga with PPROM, shortened cervix - cerclage in place and to remain per MFM  S/p BMZ, weekly delalutein, once daily Lovenox, day 4/7 Ampicilllin and Erythro  Continue current mgmt  Saline lock        Recheck cbc today ( went from 10- 14)

## 2015-10-14 ENCOUNTER — Encounter (HOSPITAL_COMMUNITY): Payer: Self-pay | Admitting: *Deleted

## 2015-10-14 NOTE — Progress Notes (Signed)
Patient ID: Erin Boyd, female   DOB: 06/23/1985, 30 y.o.   MRN: [id number]  No c/o's.  +FM, cont LOF, no VB, no ctx.  AFVSS gen NAD FHTs 140-150's, mod var, category 1 toco rare  PPROM at 25+6 - s/p BMZ, On A/E for latency Also h/o DVT on prophylactic Lovenox daily Continue close monitoring D/w pt removal of cerclage - r/b/a and various arguments for and against, have d/w MFM x 2.

## 2015-10-15 LAB — TYPE AND SCREEN
ABO/RH(D): A POS
ANTIBODY SCREEN: NEGATIVE

## 2015-10-15 NOTE — Progress Notes (Signed)
Anesthesia and MD at bedside. Fetal heart motion confirmed via bedside ultrasound.

## 2015-10-15 NOTE — Progress Notes (Signed)
Unable to trace fetal heart rate via external monitor. Rapid response nurse at bedside attempting to trace fetal heart rate via doppler. Heart rate traced briefly with doppler at 145. Md notified.

## 2015-10-15 NOTE — Progress Notes (Signed)
Patient ID: Mathews Robinsonsshley A Miley-Muhammad, female   DOB: 07/15/1985, 30 y.o.   MRN: [id number]  PPROM 26+5, UE DVT on lovenox; cervical shortening - on progesterone  No c/o's.  +FM, cont LOF, no VB, no ctx  gen NAD FHTs 140-150's, good var, category 1 toco none/rare  Continue current mgmt Finishing antibiotics today 7/7 Amp and Erythro S/p BMZ On progesterone and Lovenox

## 2015-10-15 NOTE — Progress Notes (Signed)
Faculty Practice OB/GYN Attending Note  Subjective:  Called to evaluate patient due to RN having difficulty with FHR tracing.  Patient admitted with PPROM at 4073w5d; currently 1155w5d.  Objective:  Blood pressure 114/57, pulse 71, temperature 98.1 F (36.7 C), temperature source Oral, resp. rate 20, height 5' 9.5" (1.765 m), weight 250 lb (113.399 kg), last menstrual period 04/11/2015. Gen: NAD HENT: Normocephalic, atraumatic Lungs: Normal respiratory effort Heart: Regular rate noted Abdomen: NT, gravid fundus, soft Cervix: Deferred Ext: 2+ DTRs, no edema, no cyanosis, negative Homan's sign  Bedside ultrasound:  Able to visualize FHR on ultrasound, in the 140s, cephalic presentation currently.  Assessment & Plan:  30 y.o. [id number] at 6855w5d admitted for PPROM; FHR reassuring Continue current plan of care as per primary team.  Jaynie CollinsUGONNA  ANYANWU, MD, FACOG Attending Obstetrician & Gynecologist Faculty Practice, [facility]

## 2015-10-16 NOTE — Progress Notes (Signed)
Difficulty obtaining a continuous fetal monitor tracing. Fetal heart tones audible, and tracing at times, in lower left quadrant. Monitor hand held for 20 minutes, with multiple changes in position of monitor. Good fetal movement heard and patient states she feels good fetal movement. Patient states unable to rest on right side to attempt monitoring, as it is to uncomfortable.

## 2015-10-16 NOTE — Progress Notes (Signed)
Patient ID: Erin Boyd, female   DOB: 11/29/1985, 30 y.o.   MRN: [id number]  PPROM 26+6, UE DVT - On Lovenox, S/p Amp/Erythro, Weekly progesterone, S/P BMZ  No c/o's  AFVSS gen NAD FHTs 150-155, mod var, category 1 toco none  Doing well Will check CBC tomorrow Cont current mgmt

## 2015-10-16 NOTE — Progress Notes (Signed)
Initial Nutrition Assessment  DOCUMENTATION CODES:  Obesity unspecified  INTERVENTION:  Regular diet May order double protein portions, snacks TID and from retail  NUTRITION DIAGNOSIS:  Increased nutrient needs related to  (pregnancy and fetal growth requirements) as evidenced by  (26 weeks IUP).  GOAL:   Patient will meet greater than or equal to 90% of their needs  MONITOR:  Weight trends  REASON FOR ASSESSMENT:   Antenatal, LOS   ASSESSMENT:   26 6/7 weeks, PPROM. Reports diet is tolerated well and good appetite. pre- preg weight 233 Lbs, BMI 34. 17 Lb weight gain  Diet Order:  Diet regular Room service appropriate?: Yes; Fluid consistency:: Thin  Skin:  Reviewed, no issues  Height:   Ht Readings from Last 1 Encounters:  10/09/15 5' 9.5" (1.765 m)   Weight:   Wt Readings from Last 1 Encounters:  10/09/15 250 lb (113.399 kg)   Ideal Body Weight:  68 kg  BMI:  Body mass index is 36.4 kg/(m^2).  Estimated Nutritional Needs:   Kcal:  2300-2500  Protein:  100-110 g  Fluid:  2.6L  EDUCATION NEEDS:   No education needs identified at this time  Inez PilgrimKatherine Dayquan Buys M.Odis LusterEd. R.D. LDN Neonatal Nutrition Support Specialist/RD III Pager [phone]      Phone [phone]

## 2015-10-17 LAB — CBC
HEMATOCRIT: 31.8 % — AB (ref 36.0–46.0)
Hemoglobin: 10.5 g/dL — ABNORMAL LOW (ref 12.0–15.0)
MCH: 28.8 pg (ref 26.0–34.0)
MCHC: 33 g/dL (ref 30.0–36.0)
MCV: 87.4 fL (ref 78.0–100.0)
PLATELETS: 176 10*3/uL (ref 150–400)
RBC: 3.64 MIL/uL — AB (ref 3.87–5.11)
RDW: 13.5 % (ref 11.5–15.5)
WBC: 12.3 10*3/uL — ABNORMAL HIGH (ref 4.0–10.5)

## 2015-10-17 NOTE — Progress Notes (Signed)
Patient ID: Erin Boyd, female   DOB: 01/02/1986, 30 y.o.   MRN: [id number]  PPROM 27 weeks, s/p A/E for latency, s/p BMZ, h/o UE DVT on prophylactic lovenox, Weekly Delalutein for short cervical length  No c/o's.  +FM, cont LOF, no VB, no ctx.  WBC stable.  AFVSS gen NAD Abd soft, gravid NT FHTs 145-155, good var, category 1 toco none   Doing well Continue current mgmt Close monitoring

## 2015-10-18 LAB — TYPE AND SCREEN
ABO/RH(D): A POS
Antibody Screen: NEGATIVE

## 2015-10-18 NOTE — Progress Notes (Signed)
Patient ID: Erin Boyd, female   DOB: 01/03/1986, 30 y.o.   MRN: [id number]  27+1 PPROM, s/p abx, BMZ, On Delalutein for shortened cervix - cerclage in place, also Lovenox for UE DVT   No c/o's.  +FM, cont LOF, no VB, no ctx.    AFVSS gen NAD FHTs 140-150's, category 1, good variability toco none, rare  Cont current mgmt

## 2015-10-19 NOTE — Progress Notes (Signed)
Pt states she "is feeling some sharp pains" Pt up to BR and emptied bladder

## 2015-10-19 NOTE — Progress Notes (Addendum)
Patient ID: Erin Boyd, female   DOB: 10/23/1985, 30 y.o.   MRN: [id number] 27 2/7  PPROM, s/p abx, BMZ Cerclage in place for shortened cervix and 17-P Lovenox for h/o UE  DVT   No ctx or VB, +FM Breech on US 10/09/15  AFVSS gen NAD FHTs 140's, category 1, good variability toco none, rare  Cont current mgmt

## 2015-10-20 NOTE — Progress Notes (Signed)
Patient ID: Erin Boyd, female   DOB: 01/25/1986, 30 y.o.   MRN: [id number] 27 3/7 PPROM, s/p abx, BMZ Cerclage in place for shortened cervix and 17-P Lovenox for h/o UE DVT   No ctx or VB, +FM, pt sleeping but rousable Breech on US 10/09/15  AFVSS gen NAD FHTs 140's, category 1, good variability toco no significant contractions  Cont current mgmt

## 2015-10-21 LAB — CBC
HCT: 33.1 % — ABNORMAL LOW (ref 36.0–46.0)
Hemoglobin: 11.2 g/dL — ABNORMAL LOW (ref 12.0–15.0)
MCH: 29.4 pg (ref 26.0–34.0)
MCHC: 33.8 g/dL (ref 30.0–36.0)
MCV: 86.9 fL (ref 78.0–100.0)
Platelets: 189 10*3/uL (ref 150–400)
RBC: 3.81 MIL/uL — AB (ref 3.87–5.11)
RDW: 13.4 % (ref 11.5–15.5)
WBC: 10 10*3/uL (ref 4.0–10.5)

## 2015-10-21 LAB — TYPE AND SCREEN
ABO/RH(D): A POS
ANTIBODY SCREEN: NEGATIVE

## 2015-10-21 NOTE — Progress Notes (Signed)
Patient ID: Erin Boyd, female   DOB: 12/19/1985, 30 y.o.   MRN: [id number]  27+4 PPROM s/p BMZ and Amp/Erythro Short cervix - cerclage and 17 OHP Lovenox prophylactic for UE DVT in early pregnancy  No c/o's.  +FM, cont LOF, no VB, no ctx.    AFVSS gen NAD FHTs 140-150's good variability , category 1 toco none  Abd gravid, NT  vtx on bs US last time checked  Continue current mgmt

## 2015-10-22 MED ORDER — TETANUS-DIPHTH-ACELL PERTUSSIS 5-2.5-18.5 LF-MCG/0.5 IM SUSP
0.5000 mL | Freq: Once | INTRAMUSCULAR | Status: DC
  Filled 2015-10-22: qty 0.5

## 2015-10-22 NOTE — Consult Note (Signed)
Neonatology Note:  I briefly met with Erin Boyd to answer several questions related to expectations should she deliver in the near future at approximately 28 weeks and more specifically should she deliver at 34 weeks.  We discussed anticipated length of stay and feeding questions.  _____________________ Electronically Signed By: Higinio Roger, DO  Attending Neonatologist

## 2015-10-22 NOTE — Progress Notes (Signed)
Patient ID: Erin Boyd, female   DOB: 08/16/1985, 30 y.o.   MRN: [id number]  27+5 PPROM, s/p BMZ and Amp/erythro On 17 OHP shortened cervix, cerclage On Lovenox UE DVT in preg  No c/o's.  +FM, no LOF, no VB, no ctx  AFVSS gen NAD Abd soft, gravif, FNT FHTs 1401-150's, good var, category 1 toco none  Continue current mgmt Plan for delivery if not before 6/29 - last bedside check pt vtx Growth US 3 - 4 week after last glucola Thur with Tdap

## 2015-10-23 MED ORDER — TETANUS-DIPHTH-ACELL PERTUSSIS 5-2.5-18.5 LF-MCG/0.5 IM SUSP
0.5000 mL | Freq: Once | INTRAMUSCULAR | Status: AC
  Administered 2015-10-24: 0.5 mL via INTRAMUSCULAR
  Filled 2015-10-23 (×2): qty 0.5

## 2015-10-23 NOTE — Progress Notes (Addendum)
Patient ID: Erin Boyd, female   DOB: 01/12/1986, 30 y.o.   MRN: [id number]  PPROM 27+6 - s/p BMZ and A/E 17 OHP q week given short cervix and ROM, also cerclage Daily prophylactiv Lovenox given UE DVT in early preg  No c/o's.  +FM, cont LOF, no VB, no ctx  AFVSS gen NAD Abd soft, gravid, NT  FHTs 140-150's, good var, category 1 toco none  Cont current mgmt Glucola/Tdap tommorow Growth US 4wk p 10/09/15 Appreciate Neonatal input

## 2015-10-24 LAB — TYPE AND SCREEN
ABO/RH(D): A POS
ANTIBODY SCREEN: NEGATIVE

## 2015-10-24 LAB — GLUCOSE TOLERANCE, 1 HOUR: GLUCOSE 1 HOUR GTT: 132 mg/dL (ref 70–140)

## 2015-10-24 NOTE — Progress Notes (Signed)
Patient ID: Erin Boyd, female   DOB: 05/20/1986, 30 y.o.   MRN: [id number]  PPROM at 28 week.  S/p BMZ, amp/erythro 17 OHP for short cervix, cerclage Lovenox - prophylaxis for UE DVT in Januarary  Glucola/Tdap today.  No c/o's.  +FM, cont LOF, no VB, no ctx AFVSS gen NAD Abd soft, gravid NT  FHTs 135-140 good var, category 1 toco none  Continue current mgmt.   US next week for growth Plan for delivery 6/29, last bs check vtx

## 2015-10-24 NOTE — Progress Notes (Signed)
Pt states that it started when lab drew blood. No localized tenderness or swelling noted. No mobility issues.

## 2015-10-25 NOTE — Progress Notes (Signed)
Patient ID: Erin Boyd, female   DOB: 07/31/1985, 30 y.o.   MRN: [id number]  PPROM 28+1, s/p BMZ and amp/erythro 17 OHP for short cervix/cerclage Lovenox, prophylactic dose, LUE DVT in January  No c/o's.  Sleepy but arousable.  +FM, cont LOF, no VB, no ctx  AFVSS glucola 132  gen NAD Abd soft, gravid, NT FHTs 140-155, good var, category 1 toco none  Continue current mgmt

## 2015-10-26 NOTE — Progress Notes (Addendum)
Patient ID: Erin Boyd, female   DOB: 05/30/1986, 30 y.o.   MRN: [id number]  28+2 PPROM s/p BMZ and latency antibiotics 17 OHP for cervical shortening/cerclage Lovenox (prophylactic) for UE DVT in January  No c/o's.  +FM, cont LOF, no VB, no ctx.  Getting "stir crazy" - have discussed several times in last few days AFVSS gen NAD Abd coft, gravid NT  FHTs 140-150, good var, category 1 toco none  Cont current mgmt, monitor closely

## 2015-10-27 LAB — CBC
HEMATOCRIT: 30.4 % — AB (ref 36.0–46.0)
Hemoglobin: 10.2 g/dL — ABNORMAL LOW (ref 12.0–15.0)
MCH: 29.2 pg (ref 26.0–34.0)
MCHC: 33.6 g/dL (ref 30.0–36.0)
MCV: 87.1 fL (ref 78.0–100.0)
PLATELETS: 158 10*3/uL (ref 150–400)
RBC: 3.49 MIL/uL — ABNORMAL LOW (ref 3.87–5.11)
RDW: 13.8 % (ref 11.5–15.5)
WBC: 8.6 10*3/uL (ref 4.0–10.5)

## 2015-10-27 LAB — TYPE AND SCREEN
ABO/RH(D): A POS
ANTIBODY SCREEN: NEGATIVE

## 2015-10-27 NOTE — Progress Notes (Signed)
Patient ID: Erin Boyd, female   DOB: 12/20/1985, 30 y.o.   MRN: [id number]  28+3 PPROM s/p BMZ, s/p antibiotics 17 OHP for shortened cervix and cerclage Lovenox, prophylactic for UE DVT in January  No c/o's; +FM, cont LOF, no VB, no ctx  AFVSS gen NAD Abd soft, gravid, NT FHTs 145-150's, good var, category 1 toco none  Cont current mgmt

## 2015-10-27 NOTE — Progress Notes (Signed)
Patient ID: Erin Boyd, female   DOB: 06/05/1986, 30 y.o.   MRN: [id number]  Pt called office to text MD that patient wanted to discuss POC.    Pt's 28yo daughter her without adult supervision On multiple occasions has been talked about regarding this policy.  Pt states has no other options Child's father died and his family is not involved. Pt's father comes from [place] to drive her daughter to school, [employer] thru Wed. After ACES her drops child at hospital to spend time with mother and do homework, picked up in evening for bed Thursdays and Friday pt's mother picks up child, is remarried ad husband not involved with child. Pt has cousins, one who is out of town until July, one who works 3-11 shift, and one with a new baby.  Feels she has no other options [facility] - children have to have a responsible adult other than patient present.  No one is trying to break up her family or be disrespectful.    D/W house coverage also left message with SW  Reiterated to her hospitalization due to PPROM, things can change quickly.  If she leaves would be AMA.  Risk to survival of fetus.    Mentioned to patient this has been brought up on multiple occasions, we need to find solution.

## 2015-10-27 NOTE — Progress Notes (Signed)
Raquel SarnaLorinda Shaw, RN House coverage in to discuss w/ pt need for family to pick up child or come to be w/ her in hosp.  Safety issue for both child & pt if child left w/ no other adult supervision in case of emergency.

## 2015-10-27 NOTE — Progress Notes (Signed)
CSW spoke with pt re: nursing concerns re: her 28 year old's visitation.  CSW explained that it was against hospital policy for minor children to visit pts in the hospital without being accompanied by an adult.   CSW further explained that this was related to both her/her child's safety and in case of an emergency situation re: pt, hospital staff would not be able to care for child.  Per pt, she has previously discussed this with a supervisor who gave her permission to continue her visits (as is) with her daughter, and does not understand why this issue is being brought up again after 3 weeks.  Pt was unable to provide name of supervisor who approved arrangement.  Pt describes herself as an only child with supportive parents/extended family.  She appreciates the burden her hospitalization has placed on her parents and her need for childcare.  She states that her parents are already doing a lot to help her, and she seems it only fair that she give them a break.  She expresses that she has felt very lonely during this hospitalization and looks forward to her daughter's visits, and doesn't know what she would do if her daughter couldn't be here with her.  CSW reiterated that it was not the visitation that was the problem, but was visitation without supervision that was the problem.  Pt explained to CSW that if there were to be an emergency, she has an aunt and mother who live off [address] and could be here quickly.  Pt stated that she will leave if daughter could not be here with her that she could "call 911" if she went into labor at home. Emotional support provided.  CSW discussed with nusing, will ask unit CSW to f/u in am.  Pollyann SavoyJody Charrise Lardner, LCSW Weekend Coverage [phone]

## 2015-10-28 NOTE — Progress Notes (Signed)
Pt informed that we will need to listen to the baby soon, that it has been too long since the baby was on the monitor. Pt turning over in the bed away from nurse while informing Rn to return in one hour.

## 2015-10-28 NOTE — Progress Notes (Signed)
Patient ID: Erin Boyd, female   DOB: 01/24/1986, 30 y.o.   MRN: [id number]  28+4 PPROM, s/p abx and BMZ 17 OHP secondary to short cervix and cerclage Lovenox secondary to UE DVT  No c/o's.  +FM, cont LOF, no VB, no ctx  gen NAD Abd soft, gravid, NT FHTs 140-150's, good var, category 1, on variable toco none  Cont current mgmt SW to help with child situation

## 2015-10-29 NOTE — Progress Notes (Signed)
Patient ID: Erin Boyd, female   DOB: 11/07/1985, 30 y.o.   MRN: [id number]  28+5 PPROM, s/p abx, s/p BMZ 17-OHP for cervical incompetence - short cervix, cerclage Lovenox for UE DVT  +FM, cont LOF, no VB, no ctx AFVSS gen NAD Abd soft, gravid, NT  FHTs 140-150's, good var, category 1 toco none  Continue current mgmt

## 2015-10-30 LAB — CBC
HCT: 31.1 % — ABNORMAL LOW (ref 36.0–46.0)
HEMOGLOBIN: 10.5 g/dL — AB (ref 12.0–15.0)
MCH: 29.2 pg (ref 26.0–34.0)
MCHC: 33.8 g/dL (ref 30.0–36.0)
MCV: 86.4 fL (ref 78.0–100.0)
Platelets: 162 10*3/uL (ref 150–400)
RBC: 3.6 MIL/uL — AB (ref 3.87–5.11)
RDW: 13.3 % (ref 11.5–15.5)
WBC: 8.8 10*3/uL (ref 4.0–10.5)

## 2015-10-30 LAB — TYPE AND SCREEN
ABO/RH(D): A POS
ANTIBODY SCREEN: NEGATIVE

## 2015-10-30 NOTE — Progress Notes (Signed)
Patient ID: Erin Boyd, female   DOB: 07/10/1985, 30 y.o.   MRN: [id number]  28+6 PPROM s/p abx, BMZ On 17 OHP secondary to chort cervix and cerclage Lovenox secondary to UE DVT in 1/17  +FM, cont LOF, no VB, no ctx, no c/o's  AFVSS gen NAD Abd soft, gravid, NT  FHTs 140-150's, good var, category 1, periods with variables category 2 toco none  Cont current mgmt.

## 2015-10-30 NOTE — Progress Notes (Signed)
UI noted on FHR tracing.  Pt denies feeling "anything".  Pt instructed to informed RN if she begins to feels abdominal cramping or ctxs.  Pt verbalized understanding & agreeable.

## 2015-10-30 NOTE — Progress Notes (Signed)
Pt [facility]  Pt states she feels a knot that itches and gets raised once she scratches.  RN assessed area, palpated 1cm knot that became raised once pt scratched area.  Pt informed will inform MD when rounds in am.  Pt verbalized understanding & agreeable.

## 2015-10-31 NOTE — Progress Notes (Signed)
Patient ID: Erin Boyd, female   DOB: 09/06/1985, 30 y.o.   MRN: [id number]  29+0 PPROM s/p abx, s/p BMZ 17 OHP for short cervix and cerclage Lovenox for UE DVT  C/o arm pain after IJ, reassured pt.  +FM, some LOF, no VB, occ ctx  AFVSS gen NAD  FHTs 140-150, good var, category 1 toco occ/some irritability  Continue current mgmt

## 2015-10-31 NOTE — Progress Notes (Signed)
LCSW met with patient along with other LCSW: Glenard Haring to discuss plan and obtain more information regarding patient's 28 year old daughter coming to hospital unattended.  Patient was very open to conversation and explained due to limited resources with family and supports, child comes to see patient Wednesday afternoons with a 1-1.5 hour time window where she is unattended by another adult other than patient. LCSWs attempted to brain strom different options and supports with patient in effort to have child be able to come and see patient with another support.  Schedule is as follows: Monday morning: child is picked up by maternal grandfather and goes to school throughout the day and will come back to see patient on Wednesday around 4:00/4:30pm until 6pm when grandmother comes to pick up patient.  (Patient asked to have grandfather remain in hospital/room until grandmother arrives). Wednesday evening until Sunday (patient will be with maternal grandmother). Child will not be staying overnight and patient reports this has not been happening. On June 12th, child will be out of school and maternal great grandmother will be taking work off to watch child as patient remains in hospital.  Patient educated at length regarding hospital policy and reasons for needing adult with child while patient visits. Patient voices understanding and is very tearful reporting"  I am just trying to do my best, it makes me so happy to see my child and I get lonely looking at four walls all day".  LCSWs provided emotional support and outlets for patient to process and talk about current situation and inability to go home.   Plan: LCSWs plan to follow patient acutely while in hospital and make sure patient is adhering to hospital policy. Discussed at length with current RN and AD of unit regarding next steps and if needing SW intervention. NO CPS report made at this time.  CPS is an option if patient has an emergency (such as  contractions, surgery etc) and child left unattended with no family present or can be reached with patient needing supervision.   Discussed with Director of SW: [facility]. Will be discussed with weekend SW in case situation arises.    Lane Hacker, MSW Clinical Social Work: [employer]

## 2015-11-01 NOTE — Progress Notes (Signed)
Patient ID: Erin Boyd, female   DOB: 12/20/1985, 30 y.o.   MRN: [id number]  29+1 PPROM, s/p abx,s/p BMZ 17 OHP for short cervix and cerclage Lovenox for UE DVT  No c/o's.  +FM, cont LOF, no VB, occ ctx.  AFVSS gen NAD FHTs 135-150, good var, category 1, One variable noted while tracing toco rare  Cont current mgmt

## 2015-11-01 NOTE — Progress Notes (Signed)
Patient came out to desk and reported to Melvern BankerJinean Barnes, RN that she got up to the BR and had moderate amt pinkish fluid.  Pad changed.  No frank bleeding noted.  Denies u/c, abd discomfort.  Back to room and to bed.  Sleeping when this RN went to room after getting this report.  Wakened for assessment, states occ mild cramping around 0600  that is gone now.  Scant amt light pink fluid noted on pad at this time.

## 2015-11-01 NOTE — Progress Notes (Addendum)
Patient ID: Erin Boyd, female   DOB: 11/06/1985, [age] y.o.   MRN: [id number] Pt doing well. Had some pink tinged vaginal discharge and passed mucus plug this am - none since. Feels well at this time. Denies contractions. +Fms VSS- afeb EFM - 145, cat 1 TOCO - no contractions SVE - deferred  A/P: IUP at 29 1/7wks - stable at this time         On 17 OHP secondary to short cervix and cerclage         Lovenox secondary to UE DVT in 1/17         If active labor will remove cerclage         Expectant management at this time

## 2015-11-02 LAB — CBC
HCT: 30.3 % — ABNORMAL LOW (ref 36.0–46.0)
HEMOGLOBIN: 10.2 g/dL — AB (ref 12.0–15.0)
MCH: 29.1 pg (ref 26.0–34.0)
MCHC: 33.7 g/dL (ref 30.0–36.0)
MCV: 86.3 fL (ref 78.0–100.0)
Platelets: 145 10*3/uL — ABNORMAL LOW (ref 150–400)
RBC: 3.51 MIL/uL — ABNORMAL LOW (ref 3.87–5.11)
RDW: 13.6 % (ref 11.5–15.5)
WBC: 8.4 10*3/uL (ref 4.0–10.5)

## 2015-11-02 LAB — TYPE AND SCREEN
ABO/RH(D): A POS
ANTIBODY SCREEN: NEGATIVE

## 2015-11-02 NOTE — Progress Notes (Addendum)
Patient ID: Erin Boyd, female   DOB: 07/29/1985, 30 y.o.   MRN: [id number] Pt with no complaints - denies any contractions, VB or fever/chills. +Fms. Still has pink tinged LOF but less than before VSS EFM- 145, mod variability TOCO - no contractions  SVE deferred  Wbc 8.4  A/P: IUP at 29 2/7wks - stable at this time  On 17 OHP secondary to short cervix and cerclage  Lovenox secondary to UE DVT in 1/17  If active labor will remove cerclage  Expectant management at this time

## 2015-11-03 NOTE — Progress Notes (Addendum)
Patient ID: Erin Boyd, female   DOB: 12/02/1985, 30 y.o.   MRN: [id number] Pt doing well. Reports some vaginal spotting overnight but no contractions, pelvic pain, fever or headaches. +fms. No complaints at this time. Appreciating FMs VSS EFM- 130s TOCO - no contractions SVE- deferred  A/P: IUP at 29 3/7wks - stable at this time  On 17 OHP secondary to short cervix and cerclage  Lovenox secondary to UE DVT in 1/17  If active labor will remove cerclage  Expectant management at this time

## 2015-11-04 NOTE — Progress Notes (Signed)
[redacted]W[redacted]D, PPROM, incompetent cervix with cerclage, h/o UE DVT No problems Afeb, VSS Fundus NT FHT- reactive, no significant decels Continue observation, 17-OHP, Lovenox, remove cerclage if has ctx

## 2015-11-05 LAB — CBC
HEMATOCRIT: 32.5 % — AB (ref 36.0–46.0)
Hemoglobin: 11.2 g/dL — ABNORMAL LOW (ref 12.0–15.0)
MCH: 29.2 pg (ref 26.0–34.0)
MCHC: 34.5 g/dL (ref 30.0–36.0)
MCV: 84.9 fL (ref 78.0–100.0)
PLATELETS: 152 10*3/uL (ref 150–400)
RBC: 3.83 MIL/uL — ABNORMAL LOW (ref 3.87–5.11)
RDW: 13.3 % (ref 11.5–15.5)
WBC: 17.8 10*3/uL — ABNORMAL HIGH (ref 4.0–10.5)

## 2015-11-05 LAB — TYPE AND SCREEN
ABO/RH(D): A POS
Antibody Screen: NEGATIVE

## 2015-11-05 MED ORDER — BETAMETHASONE SOD PHOS & ACET 6 (3-3) MG/ML IJ SUSP
12.0000 mg | INTRAMUSCULAR | Status: DC
  Administered 2015-11-05: 12 mg via INTRAMUSCULAR
  Filled 2015-11-05: qty 2

## 2015-11-05 MED ORDER — SODIUM CHLORIDE 0.9 % IV SOLN
2.0000 g | Freq: Four times a day (QID) | INTRAVENOUS | Status: DC
  Filled 2015-11-05 (×3): qty 2000

## 2015-11-05 MED ORDER — SODIUM CHLORIDE 0.9 % IV SOLN
2.0000 g | Freq: Once | INTRAVENOUS | Status: AC
  Administered 2015-11-05: 2 g via INTRAVENOUS
  Filled 2015-11-05: qty 2000

## 2015-11-05 MED ORDER — LACTATED RINGERS IV SOLN
INTRAVENOUS | Status: DC
  Administered 2015-11-05 – 2015-11-06 (×3): via INTRAVENOUS

## 2015-11-05 MED ORDER — SOD CITRATE-CITRIC ACID 500-334 MG/5ML PO SOLN
ORAL | Status: AC
  Administered 2015-11-06: 30 mL
  Filled 2015-11-05: qty 15

## 2015-11-05 MED ORDER — BETAMETHASONE SOD PHOS & ACET 6 (3-3) MG/ML IJ SUSP: 12.5000 mg | Freq: Once | INTRAMUSCULAR | Status: DC

## 2015-11-05 MED ORDER — BUTORPHANOL TARTRATE 1 MG/ML IJ SOLN
1.0000 mg | Freq: Once | INTRAMUSCULAR | Status: AC
  Administered 2015-11-05: 1 mg via INTRAVENOUS
  Filled 2015-11-05: qty 1

## 2015-11-05 MED ORDER — LACTATED RINGERS IV BOLUS (SEPSIS)
500.0000 mL | Freq: Once | INTRAVENOUS | Status: AC
  Administered 2015-11-05: 500 mL via INTRAVENOUS

## 2015-11-05 NOTE — Progress Notes (Addendum)
Patient ID: Mathews Robinsonsshley A Miley-Muhammad, female   DOB: 09/25/1985, 30 y.o.   MRN: [id number] CTSP for contractions  Pt began to experience back pain last PM and now having mild contractions every 5-10 minutes.  No VB, +LOF FHR category 1  WBC increased to 17K this AM (previously 8K)  SSE reveals cervix long/thick/1cm Cerclage removed intact   Bedside US with breech presentation, head in maternal left  Pt given IV fluid bolus and contractions improving in intensity with that Will restart ampicillin as +GBS Repeat betamethasone x 2 as has been 4 weeks since dosing  CBC and Type and Screen UTD Will hold lovenox for now and make sure contractions do not continue to strengthen, if all quiets down, will resume.

## 2015-11-05 NOTE — Progress Notes (Addendum)
Patient ID: Erin Boyd, female   DOB: 03/23/1986, 30 y.o.   MRN: [id number]  PPROM 29+5, s/p BMX, s/p abx Progesterone for cerclage, short cervix Lovenox for UE DVT  No c/o's. +FM, cont LOF, no VB - had some VB, spotting when lost mucous plug, occ ctx; some back pain - constant not ctx  AFVSS  gen NAD Abd soft, gravid, NT  FHTs 140-150's mod var, category 1-2 toco occ  Continue current mgmt US 5/31/ 11am

## 2015-11-05 NOTE — Progress Notes (Signed)
Cerclage removed by Dr. Senaida Oresichardson using sterile technique.  Pt tolerated procedure well.

## 2015-11-05 NOTE — Progress Notes (Signed)
Bedside U/S done by Dr. Senaida Oresichardson.  Fetus breech.

## 2015-11-05 NOTE — Progress Notes (Signed)
Pt instructed to inform RN if ctxs increase in frequency & intensity.  Pt verbalized understanding and agreeable.

## 2015-11-06 ENCOUNTER — Inpatient Hospital Stay (HOSPITAL_COMMUNITY): Payer: Medicaid Other | Admitting: Anesthesiology

## 2015-11-06 ENCOUNTER — Encounter (HOSPITAL_COMMUNITY): Payer: Self-pay | Admitting: *Deleted

## 2015-11-06 ENCOUNTER — Ambulatory Visit (HOSPITAL_COMMUNITY): Payer: Self-pay

## 2015-11-06 ENCOUNTER — Encounter (HOSPITAL_COMMUNITY)
Admission: AD | Disposition: A | Discharge status: Home / Self Care | Payer: Self-pay | Source: Ambulatory Visit | Attending: Obstetrics and Gynecology

## 2015-11-06 DIAGNOSIS — O321XX0 Maternal care for breech presentation, not applicable or unspecified: Secondary | ICD-10-CM | POA: Diagnosis present

## 2015-11-06 LAB — CBC
HCT: 28.9 % — ABNORMAL LOW (ref 36.0–46.0)
HEMOGLOBIN: 10 g/dL — AB (ref 12.0–15.0)
MCH: 29.3 pg (ref 26.0–34.0)
MCHC: 34.6 g/dL (ref 30.0–36.0)
MCV: 84.8 fL (ref 78.0–100.0)
PLATELETS: 169 10*3/uL (ref 150–400)
RBC: 3.41 MIL/uL — ABNORMAL LOW (ref 3.87–5.11)
RDW: 13.3 % (ref 11.5–15.5)
WBC: 31.1 10*3/uL — ABNORMAL HIGH (ref 4.0–10.5)

## 2015-11-06 SURGERY — Surgical Case
Anesthesia: Spinal | Site: Abdomen | Wound class: Clean Contaminated

## 2015-11-06 MED ORDER — TETANUS-DIPHTH-ACELL PERTUSSIS 5-2.5-18.5 LF-MCG/0.5 IM SUSP: 0.5000 mL | Freq: Once | INTRAMUSCULAR | Status: DC

## 2015-11-06 MED ORDER — HYDROMORPHONE HCL 1 MG/ML IJ SOLN
INTRAMUSCULAR | Status: AC
  Filled 2015-11-06: qty 1

## 2015-11-06 MED ORDER — IBUPROFEN 600 MG PO TABS
600.0000 mg | ORAL_TABLET | Freq: Four times a day (QID) | ORAL | Status: DC
  Administered 2015-11-06 – 2015-11-09 (×10): 600 mg via ORAL
  Filled 2015-11-06 (×12): qty 1

## 2015-11-06 MED ORDER — KETOROLAC TROMETHAMINE 30 MG/ML IJ SOLN: 30.0000 mg | Freq: Four times a day (QID) | INTRAMUSCULAR | Status: AC | PRN

## 2015-11-06 MED ORDER — DIPHENHYDRAMINE HCL 25 MG PO CAPS: 25.0000 mg | ORAL_CAPSULE | ORAL | Status: DC | PRN

## 2015-11-06 MED ORDER — ENOXAPARIN SODIUM 60 MG/0.6ML ~~LOC~~ SOLN
60.0000 mg | SUBCUTANEOUS | Status: DC
Start: 2015-11-07 — End: 2015-11-09
  Administered 2015-11-07 – 2015-11-08 (×2): 60 mg via SUBCUTANEOUS
  Filled 2015-11-06 (×2): qty 0.6

## 2015-11-06 MED ORDER — MORPHINE SULFATE (PF) 0.5 MG/ML IJ SOLN
INTRAMUSCULAR | Status: AC
Start: 2015-11-06 — End: 2015-11-06
  Filled 2015-11-06: qty 10

## 2015-11-06 MED ORDER — SCOPOLAMINE 1 MG/3DAYS TD PT72
MEDICATED_PATCH | TRANSDERMAL | Status: AC
  Filled 2015-11-06: qty 1

## 2015-11-06 MED ORDER — SIMETHICONE 80 MG PO CHEW
80.0000 mg | CHEWABLE_TABLET | Freq: Three times a day (TID) | ORAL | Status: DC
  Administered 2015-11-06 – 2015-11-08 (×7): 80 mg via ORAL
  Filled 2015-11-06 (×9): qty 1

## 2015-11-06 MED ORDER — OXYTOCIN 10 UNIT/ML IJ SOLN
INTRAMUSCULAR | Status: AC
  Filled 2015-11-06: qty 4

## 2015-11-06 MED ORDER — NALBUPHINE HCL 10 MG/ML IJ SOLN: 5.0000 mg | Freq: Once | INTRAMUSCULAR | Status: DC | PRN

## 2015-11-06 MED ORDER — SENNOSIDES-DOCUSATE SODIUM 8.6-50 MG PO TABS
2.0000 | ORAL_TABLET | ORAL | Status: DC
  Administered 2015-11-06 – 2015-11-09 (×2): 2 via ORAL
  Filled 2015-11-06 (×3): qty 2

## 2015-11-06 MED ORDER — MEPERIDINE HCL 25 MG/ML IJ SOLN
INTRAMUSCULAR | Status: DC | PRN
  Administered 2015-11-06 (×2): 12.5 mg via INTRAVENOUS

## 2015-11-06 MED ORDER — OXYTOCIN 40 UNITS IN LACTATED RINGERS INFUSION - SIMPLE MED: 2.5000 [IU]/h | INTRAVENOUS | Status: AC

## 2015-11-06 MED ORDER — NALBUPHINE HCL 10 MG/ML IJ SOLN: 5.0000 mg | INTRAMUSCULAR | Status: DC | PRN

## 2015-11-06 MED ORDER — FENTANYL CITRATE (PF) 100 MCG/2ML IJ SOLN
INTRAMUSCULAR | Status: AC
  Filled 2015-11-06: qty 2

## 2015-11-06 MED ORDER — OXYCODONE HCL 5 MG PO TABS
5.0000 mg | ORAL_TABLET | ORAL | Status: DC | PRN
Start: 2015-11-06 — End: 2015-11-09

## 2015-11-06 MED ORDER — ONDANSETRON HCL 4 MG/2ML IJ SOLN
INTRAMUSCULAR | Status: AC
  Filled 2015-11-06: qty 2

## 2015-11-06 MED ORDER — LACTATED RINGERS IV BOLUS (SEPSIS): 500.0000 mL | Freq: Once | INTRAVENOUS | Status: DC

## 2015-11-06 MED ORDER — DEXAMETHASONE SODIUM PHOSPHATE 4 MG/ML IJ SOLN
INTRAMUSCULAR | Status: AC
  Filled 2015-11-06: qty 1

## 2015-11-06 MED ORDER — FENTANYL CITRATE (PF) 100 MCG/2ML IJ SOLN
INTRAMUSCULAR | Status: DC | PRN
  Administered 2015-11-06: 12.5 ug via INTRATHECAL
  Administered 2015-11-06: 87.5 ug via INTRAVENOUS

## 2015-11-06 MED ORDER — DIPHENHYDRAMINE HCL 25 MG PO CAPS: 25.0000 mg | ORAL_CAPSULE | Freq: Four times a day (QID) | ORAL | Status: DC | PRN

## 2015-11-06 MED ORDER — OXYTOCIN 10 UNIT/ML IJ SOLN
40.0000 [IU] | INTRAMUSCULAR | Status: DC | PRN
  Administered 2015-11-06: 40 [IU] via INTRAVENOUS

## 2015-11-06 MED ORDER — PHENYLEPHRINE 8 MG IN D5W 100 ML (0.08MG/ML) PREMIX OPTIME
INJECTION | INTRAVENOUS | Status: DC | PRN
  Administered 2015-11-06: 60 ug/min via INTRAVENOUS

## 2015-11-06 MED ORDER — LACTATED RINGERS IV SOLN: INTRAVENOUS | Status: DC

## 2015-11-06 MED ORDER — PHENYLEPHRINE 8 MG IN D5W 100 ML (0.08MG/ML) PREMIX OPTIME
INJECTION | INTRAVENOUS | Status: AC
  Filled 2015-11-06: qty 100

## 2015-11-06 MED ORDER — MEPERIDINE HCL 25 MG/ML IJ SOLN: 6.2500 mg | INTRAMUSCULAR | Status: DC | PRN

## 2015-11-06 MED ORDER — MENTHOL 3 MG MT LOZG: 1.0000 | LOZENGE | OROMUCOSAL | Status: DC | PRN

## 2015-11-06 MED ORDER — LACTATED RINGERS IV SOLN
INTRAVENOUS | Status: DC
  Administered 2015-11-06 (×2): via INTRAVENOUS

## 2015-11-06 MED ORDER — LIDOCAINE HCL 1 % IJ SOLN
INTRAMUSCULAR | Status: AC
  Filled 2015-11-06: qty 20

## 2015-11-06 MED ORDER — BUPIVACAINE IN DEXTROSE 0.75-8.25 % IT SOLN
INTRATHECAL | Status: DC | PRN
  Administered 2015-11-06: 1.5 mL via INTRATHECAL

## 2015-11-06 MED ORDER — SIMETHICONE 80 MG PO CHEW: 80.0000 mg | CHEWABLE_TABLET | ORAL | Status: DC | PRN

## 2015-11-06 MED ORDER — PRENATAL MULTIVITAMIN CH
1.0000 | ORAL_TABLET | Freq: Every day | ORAL | Status: DC
  Administered 2015-11-06 – 2015-11-08 (×2): 1 via ORAL
  Filled 2015-11-06 (×4): qty 1

## 2015-11-06 MED ORDER — NALOXONE HCL 0.4 MG/ML IJ SOLN: 0.4000 mg | INTRAMUSCULAR | Status: DC | PRN

## 2015-11-06 MED ORDER — MORPHINE SULFATE (PF) 0.5 MG/ML IJ SOLN
INTRAMUSCULAR | Status: DC | PRN
  Administered 2015-11-06: 4.9 mg via INTRAVENOUS
  Administered 2015-11-06: .1 mg via INTRAVENOUS

## 2015-11-06 MED ORDER — ZOLPIDEM TARTRATE 5 MG PO TABS: 5.0000 mg | ORAL_TABLET | Freq: Every evening | ORAL | Status: DC | PRN

## 2015-11-06 MED ORDER — MEPERIDINE HCL 25 MG/ML IJ SOLN
INTRAMUSCULAR | Status: AC
  Filled 2015-11-06: qty 1

## 2015-11-06 MED ORDER — METOCLOPRAMIDE HCL 5 MG/ML IJ SOLN: 10.0000 mg | Freq: Once | INTRAMUSCULAR | Status: DC | PRN

## 2015-11-06 MED ORDER — OXYCODONE HCL 5 MG PO TABS
10.0000 mg | ORAL_TABLET | ORAL | Status: DC | PRN
  Administered 2015-11-08 – 2015-11-09 (×2): 10 mg via ORAL
  Filled 2015-11-06 (×2): qty 2

## 2015-11-06 MED ORDER — DEXAMETHASONE SODIUM PHOSPHATE 4 MG/ML IJ SOLN
INTRAMUSCULAR | Status: DC | PRN
  Administered 2015-11-06: 4 mg via INTRAVENOUS

## 2015-11-06 MED ORDER — ACETAMINOPHEN 325 MG PO TABS: 650.0000 mg | ORAL_TABLET | ORAL | Status: DC | PRN

## 2015-11-06 MED ORDER — COCONUT OIL OIL
1.0000 "application " | TOPICAL_OIL | Status: DC | PRN
  Administered 2015-11-06: 1 via TOPICAL
  Filled 2015-11-06: qty 120

## 2015-11-06 MED ORDER — SIMETHICONE 80 MG PO CHEW
80.0000 mg | CHEWABLE_TABLET | ORAL | Status: DC
  Administered 2015-11-06 – 2015-11-09 (×3): 80 mg via ORAL
  Filled 2015-11-06 (×3): qty 1

## 2015-11-06 MED ORDER — DIPHENHYDRAMINE HCL 50 MG/ML IJ SOLN: 12.5000 mg | INTRAMUSCULAR | Status: DC | PRN

## 2015-11-06 MED ORDER — SCOPOLAMINE 1 MG/3DAYS TD PT72
1.0000 | MEDICATED_PATCH | Freq: Once | TRANSDERMAL | Status: DC
  Filled 2015-11-06: qty 1

## 2015-11-06 MED ORDER — FENTANYL CITRATE (PF) 100 MCG/2ML IJ SOLN
25.0000 ug | INTRAMUSCULAR | Status: DC | PRN
  Administered 2015-11-06: 50 ug via INTRAVENOUS

## 2015-11-06 MED ORDER — WITCH HAZEL-GLYCERIN EX PADS: 1.0000 "application " | MEDICATED_PAD | CUTANEOUS | Status: DC | PRN

## 2015-11-06 MED ORDER — NALOXONE HCL 2 MG/2ML IJ SOSY
1.0000 ug/kg/h | PREFILLED_SYRINGE | INTRAVENOUS | Status: DC | PRN
  Filled 2015-11-06: qty 2

## 2015-11-06 MED ORDER — KETOROLAC TROMETHAMINE 30 MG/ML IJ SOLN
30.0000 mg | Freq: Four times a day (QID) | INTRAMUSCULAR | Status: AC | PRN
  Administered 2015-11-06: 30 mg via INTRAVENOUS
  Filled 2015-11-06: qty 1

## 2015-11-06 MED ORDER — SODIUM CHLORIDE 0.9 % IV SOLN
3.0000 g | Freq: Four times a day (QID) | INTRAVENOUS | Status: AC
  Administered 2015-11-06 (×3): 3 g via INTRAVENOUS
  Filled 2015-11-06 (×3): qty 3

## 2015-11-06 MED ORDER — ONDANSETRON HCL 4 MG/2ML IJ SOLN: 4.0000 mg | Freq: Three times a day (TID) | INTRAMUSCULAR | Status: DC | PRN

## 2015-11-06 MED ORDER — SODIUM CHLORIDE 0.9% FLUSH: 3.0000 mL | INTRAVENOUS | Status: DC | PRN

## 2015-11-06 MED ORDER — HYDROMORPHONE HCL 1 MG/ML IJ SOLN
INTRAMUSCULAR | Status: DC | PRN
  Administered 2015-11-06: 1 mg via INTRAVENOUS

## 2015-11-06 MED ORDER — SCOPOLAMINE 1 MG/3DAYS TD PT72
MEDICATED_PATCH | TRANSDERMAL | Status: DC | PRN
  Administered 2015-11-06: 1 via TRANSDERMAL

## 2015-11-06 MED ORDER — DIBUCAINE 1 % RE OINT: 1.0000 "application " | TOPICAL_OINTMENT | RECTAL | Status: DC | PRN

## 2015-11-06 MED ORDER — ONDANSETRON HCL 4 MG/2ML IJ SOLN
INTRAMUSCULAR | Status: DC | PRN
  Administered 2015-11-06: 4 mg via INTRAVENOUS

## 2015-11-06 SURGICAL SUPPLY — 36 items
APL SKNCLS STERI-STRIP NONHPOA (GAUZE/BANDAGES/DRESSINGS) ×1
BENZOIN TINCTURE PRP APPL 2/3 (GAUZE/BANDAGES/DRESSINGS) ×2 IMPLANT
CLAMP CORD UMBIL (MISCELLANEOUS) IMPLANT
CLOSURE WOUND 1/2 X4 (GAUZE/BANDAGES/DRESSINGS) ×1
CLOTH BEACON ORANGE TIMEOUT ST (SAFETY) ×3 IMPLANT
DRSG OPSITE POSTOP 4X10 (GAUZE/BANDAGES/DRESSINGS) ×3 IMPLANT
DURAPREP 26ML APPLICATOR (WOUND CARE) ×3 IMPLANT
ELECT REM PT RETURN 9FT ADLT (ELECTROSURGICAL) ×3
ELECTRODE REM PT RTRN 9FT ADLT (ELECTROSURGICAL) ×1 IMPLANT
EXTRACTOR VACUUM KIWI (MISCELLANEOUS) IMPLANT
GLOVE BIO SURGEON STRL SZ 6.5 (GLOVE) ×2 IMPLANT
GLOVE BIO SURGEONS STRL SZ 6.5 (GLOVE) ×1
GLOVE BIOGEL PI IND STRL 7.0 (GLOVE) ×1 IMPLANT
GLOVE BIOGEL PI INDICATOR 7.0 (GLOVE) ×2
GOWN STRL REUS W/TWL LRG LVL3 (GOWN DISPOSABLE) ×6 IMPLANT
KIT ABG SYR 3ML LUER SLIP (SYRINGE) IMPLANT
NDL HYPO 25X5/8 SAFETYGLIDE (NEEDLE) IMPLANT
NEEDLE HYPO 25X5/8 SAFETYGLIDE (NEEDLE) IMPLANT
NS IRRIG 1000ML POUR BTL (IV SOLUTION) ×3 IMPLANT
PACK C SECTION WH (CUSTOM PROCEDURE TRAY) ×3 IMPLANT
PAD OB MATERNITY 4.3X12.25 (PERSONAL CARE ITEMS) ×3 IMPLANT
PENCIL SMOKE EVAC W/HOLSTER (ELECTROSURGICAL) ×3 IMPLANT
RTRCTR C-SECT PINK 25CM LRG (MISCELLANEOUS) ×3 IMPLANT
STRIP CLOSURE SKIN 1/2X4 (GAUZE/BANDAGES/DRESSINGS) ×1 IMPLANT
SUT CHROMIC 1 CTX 36 (SUTURE) ×6 IMPLANT
SUT PLAIN 0 NONE (SUTURE) IMPLANT
SUT PLAIN 2 0 XLH (SUTURE) ×5 IMPLANT
SUT VIC AB 0 CT1 27 (SUTURE) ×6
SUT VIC AB 0 CT1 27XBRD ANBCTR (SUTURE) ×2 IMPLANT
SUT VIC AB 2-0 CT1 27 (SUTURE) ×3
SUT VIC AB 2-0 CT1 TAPERPNT 27 (SUTURE) ×1 IMPLANT
SUT VIC AB 3-0 CT1 27 (SUTURE)
SUT VIC AB 3-0 CT1 TAPERPNT 27 (SUTURE) IMPLANT
SUT VIC AB 4-0 KS 27 (SUTURE) ×3 IMPLANT
TOWEL OR 17X24 6PK STRL BLUE (TOWEL DISPOSABLE) ×3 IMPLANT
TRAY FOLEY CATH SILVER 14FR (SET/KITS/TRAYS/PACK) ×3 IMPLANT

## 2015-11-06 NOTE — Anesthesia Preprocedure Evaluation (Signed)
Anesthesia Evaluation  Patient identified by MRN, date of birth, ID band Patient awake    Reviewed: Allergy & Precautions, NPO status , Patient's Chart, lab work & pertinent test results  Airway Mallampati: II  TM Distance: >3 FB Neck ROM: Full    Dental no notable dental hx.    Pulmonary former smoker,    Pulmonary exam normal breath sounds clear to auscultation       Cardiovascular + DVT  Normal cardiovascular exam Rhythm:Regular Rate:Normal  WPW   Neuro/Psych negative neurological ROS  negative psych ROS   GI/Hepatic negative GI ROS, Neg liver ROS,   Endo/Other  negative endocrine ROS  Renal/GU negative Renal ROS  negative genitourinary   Musculoskeletal negative musculoskeletal ROS (+)   Abdominal   Peds negative pediatric ROS (+)  Hematology negative hematology ROS (+)   Anesthesia Other Findings   Reproductive/Obstetrics (+) Pregnancy                             Anesthesia Physical Anesthesia Plan  ASA: II  Anesthesia Plan: Spinal   Post-op Pain Management:    Induction:   Airway Management Planned: Natural Airway  Additional Equipment:   Intra-op Plan:   Post-operative Plan:   Informed Consent: I have reviewed the patients History and Physical, chart, labs and discussed the procedure including the risks, benefits and alternatives for the proposed anesthesia with the patient or authorized representative who has indicated his/her understanding and acceptance.   Dental advisory given  Plan Discussed with: CRNA  Anesthesia Plan Comments:         Anesthesia Quick Evaluation

## 2015-11-06 NOTE — Progress Notes (Signed)
Dr. Ellyn HackBovard notified of patient's minimal urine output. Lactated ringers bolus ordered. Nurse will continue to monitor and notify MD if no improvement.

## 2015-11-06 NOTE — Progress Notes (Addendum)
Patient ID: Erin Robinsonsshley A Miley-Muhammad, female   DOB: 09/12/1985, 30 y.o.   MRN: [id number] POD 0 Pt doing well, pain controlled afeb VSS Abdomen soft, incision clean and dry  Will continue Unasyn x 24 hours then d/c Baby stable in NICU Restart lovenox 11/07/15

## 2015-11-06 NOTE — Lactation Note (Signed)
This note was copied from a baby'Boyd chart. Lactation Consultation Note  Initial visit made.  Breastfeeding consultation services and support information given to patient.  Providing Breastmilk For Your Baby In NICU booklet also given and reviewed.  Baby was delivered at 29 weeks and 6 days and is 9 hours old.  Mom has pumped twice with symphony DEBP and obtaining a few drops.  Reviewed pump use and hand expression.  Instructed to pump breasts every 2-3 hours on initiation setting.  Mom plans on purchasing a breast pump after discharge.  Encouraged to call with concerns/assist prn.  Patient Name: Erin Boyd GNFAO'ZToday'Boyd Date: 11/06/2015 Reason for consult: Initial assessment;NICU baby   Maternal Data Has patient been taught Hand Expression?: Yes  Feeding    LATCH Score/Interventions                      Lactation Tools Discussed/Used Pump Review: Setup, frequency, and cleaning;Milk Storage Initiated by:: RN Date initiated:: 11/06/15   Consult Status Consult Status: Follow-up Date: 11/07/15 Follow-up type: In-patient    Huston FoleyMOULDEN, Erin Boyd 11/06/2015, 10:46 AM

## 2015-11-06 NOTE — Anesthesia Postprocedure Evaluation (Signed)
Anesthesia Post Note  Patient: Erin Boyd  Procedure(s) Performed: Procedure(s) (LRB): CESAREAN SECTION (N/A)  Patient location during evaluation: [facility] Unit Anesthesia Type: Spinal Level of consciousness: awake and alert and oriented Pain management: pain level controlled Vital Signs Assessment: post-procedure vital signs reviewed and stable Respiratory status: spontaneous breathing, nonlabored ventilation and respiratory function stable Cardiovascular status: stable Postop Assessment: no headache, no backache, spinal receding, patient able to bend at knees, no signs of nausea or vomiting and adequate PO intake     Last Vitals:  Filed Vitals:   11/06/15 0600 11/06/15 0652  BP: 105/53 102/57  Pulse: 82 95  Temp: 36.7 C 36.7 C  Resp: 18 18    Last Pain:  Filed Vitals:   11/06/15 0654  PainSc: 0-No pain   Pain Goal: Patients Stated Pain Goal: 3 (11/04/15 1925)               Madison HickmanGREGORY,Glanda Spanbauer

## 2015-11-06 NOTE — Addendum Note (Signed)
Addendum  created 11/06/15 1009 by Shanon PayorSuzanne M Vrinda Heckstall, CRNA   Modules edited: Clinical Notes   Clinical Notes:  File: [id number]

## 2015-11-06 NOTE — Progress Notes (Signed)
Patient ID: Erin Boyd, female   DOB: 06/29/1985, 30 y.o.   MRN: [id number] Pt continued to contract after removal of cerclage and pain reached a 10/10 with her asking for pain medication  Cervix 30/2-3/-3 at that time and given stadol x 1, she received only 2 hours of relief and was back to a 10/10 on the pain scale Cervix then felt to be 50/3-4/-2  Breech  D/w pt need to proceed with c-section and NICU informed.  Risks anhd benefits reviewed with patient including bleeding, infection and  Possible damage to bowel and bladder.  She is ready to proceed.  Will change antibiotics to unasyn for presumed chorioamnionitis and broader coverage. OR getting ready

## 2015-11-06 NOTE — Anesthesia Procedure Notes (Signed)
Spinal Patient location during procedure: OR Staffing Anesthesiologist: ,  Performed by: anesthesiologist  Preanesthetic Checklist Completed: patient identified, site marked, surgical consent, pre-op evaluation, timeout performed, IV checked, risks and benefits discussed and monitors and equipment checked Spinal Block Patient position: sitting Prep: ChloraPrep Patient monitoring: heart rate, continuous pulse ox and blood pressure Approach: midline Location: L4-5 Injection technique: single-shot Needle Needle type: Sprotte  Needle gauge: 24 G Needle length: 9 cm Additional Notes Expiration date of kit checked and confirmed. Patient tolerated procedure well, without complications.     

## 2015-11-06 NOTE — Anesthesia Postprocedure Evaluation (Signed)
Anesthesia Post Note  Patient: Erin Boyd  Procedure(s) Performed: Procedure(s) (LRB): CESAREAN SECTION (N/A)  Patient location during evaluation: PACU Anesthesia Type: Spinal Level of consciousness: awake and alert and oriented Pain management: pain level controlled Vital Signs Assessment: post-procedure vital signs reviewed and stable Respiratory status: spontaneous breathing, nonlabored ventilation and respiratory function stable Cardiovascular status: blood pressure returned to baseline and stable Postop Assessment: no signs of nausea or vomiting, patient able to bend at knees, spinal receding, no backache and no headache Anesthetic complications: no     Last Vitals:  Filed Vitals:   11/06/15 0600 11/06/15 0652  BP: 105/53 102/57  Pulse: 82 95  Temp: 36.7 C 36.7 C  Resp: 18 18    Last Pain:  Filed Vitals:   11/06/15 0654  PainSc: 0-No pain   Pain Goal: Patients Stated Pain Goal: 3 (11/04/15 1925)               Bernardino Dowell A.

## 2015-11-06 NOTE — Transfer of Care (Signed)
Immediate Anesthesia Transfer of Care Note  Patient: Erin Boyd  Procedure(s) Performed: Procedure(s): CESAREAN SECTION (N/A)  Patient Location: PACU  Anesthesia Type:Spinal  Level of Consciousness: awake, alert  and oriented  Airway & Oxygen Therapy: Patient Spontanous Breathing  Post-op Assessment: Report given to RN and Post -op Vital signs reviewed and stable  Post vital signs: Reviewed and stable  Last Vitals:  Filed Vitals:   11/05/15 1514 11/05/15 2002  BP: 127/73 104/47  Pulse: 106 102  Temp: 37.1 C 37.2 C  Resp: 20 22    Last Pain:  Filed Vitals:   11/06/15 0014  PainSc: 10-Worst pain ever      Patients Stated Pain Goal: 3 (11/04/15 1925)  Complications: No apparent anesthesia complications

## 2015-11-06 NOTE — Op Note (Signed)
Operative Note    Preoperative Diagnosis PPROM at 29 6/7 weeks Labor Breech presentation H/o DVT  Postoperative Diagnosis same  Procedure Primary LTCS with two layer closure of uterus  Surgeon Huel CoteKathy Yaqub Arney  Anesthesia Spinal  Fluids: EBL 700cc UOP 700c IVF 1400cc  Findings  The uterus was normal for gestational age.  Ovaries and tubes WNL. There was a viable female infant in the frank breech presentation. Apgars 7,8  3#4oz  NICU present at delivery.  The placenta was very fragmented and delivered in pieces with some areas appearing slightly necrotic   Specimen Placenta to pathology  Procedure Note  Patient was taken to the operating room where spinal anesthesia was obtained and  found to be adequate by Allis clamp test. She was prepped and draped in the normal sterile fashion in the dorsal supine position with a leftward tilt. An appropriate time out was performed. A Pfannenstiel skin incision was then made with the scalpel and carried through to the underlying layer of fascia by sharp dissection and Bovie cautery. The fascia was nicked in the midline and the incision was extended laterally with Mayo scissors. The inferior aspect of the incision was grasped Coker clamps and dissected off the underlying rectus muscles. In a similar fashion the superior aspect was dissected off the rectus muscles. Rectus muscles were separated in the midline and the peritoneal cavity entered bluntly. The peritoneal incision was then extended both superiorly and inferiorly with careful attention to avoid both bowel and bladder. The Alexis self-retaining wound retractor was then placed within the incision and the lower uterine segment exposed and noted to be somewhat narrow.  The lower uterine segment was then incised in a transverse fashion and the cavity itself entered bluntly. The incision was extended bluntly. The infant's bottom was then lifted and delivered from the incision without  difficulty. The remainder of the infant delivered by reducing the arms over the chest and the head delivered easily in a flexed position. The nose and mouth were bulb suctioned with the cord clamped and cut as well. The infant was handed off to the waiting pediatricians. The placenta was then spontaneously expressed from the uterus, but did not deliver intact and was friable.  Several trailing pieces were identified and cleaned from the cavity.   The uterine incision was then repaired in 2 layers the first layer was a running locked layer 1-0 chromic and the second an imbricating layer of the same suture. The tubes and ovaries were inspected and the gutters cleared of all clots and debris. The uterine incision was inspected and found to be hemostatic. All instruments and sponges as well as the Alexis retractor were then removed from the abdomen. The rectus muscles and peritoneum were then reapproximated with a running suture of 2-0 Vicryl. The fascia was then closed with 0 Vicryl in a running fashion. Subcutaneous tissue was reapproximated with 3-0 plain in a running fashion. The skin was closed with a subcuticular stitch of 4-0 Vicryl on a Keith needle and then reinforced with benzoin and Steri-Strips. At the conclusion of the procedure all instruments and sponge counts were correct. Patient was taken to the recovery room in good condition with her baby went to the NICU accompanied by the patient's mother.

## 2015-11-07 LAB — CBC
HCT: 25.3 % — ABNORMAL LOW (ref 36.0–46.0)
HEMOGLOBIN: 8.5 g/dL — AB (ref 12.0–15.0)
MCH: 29.1 pg (ref 26.0–34.0)
MCHC: 33.6 g/dL (ref 30.0–36.0)
MCV: 86.6 fL (ref 78.0–100.0)
PLATELETS: 171 10*3/uL (ref 150–400)
RBC: 2.92 MIL/uL — AB (ref 3.87–5.11)
RDW: 13.7 % (ref 11.5–15.5)
WBC: 20.3 10*3/uL — AB (ref 4.0–10.5)

## 2015-11-07 LAB — RPR: RPR: NONREACTIVE

## 2015-11-07 NOTE — Progress Notes (Addendum)
Subjective: Postpartum Day 1: Cesarean Delivery Patient reports tolerating PO, + flatus and no problems voiding.  She denies any fever or chills or headaches. No CP or SOB. She is in good spirits. Has been with baby this am Objective: Vital signs in last 24 hours: Temp:  [97.8 F (36.6 C)-98.4 F (36.9 C)] 98.4 F (36.9 C) (06/01 0459) Pulse Rate:  [72-79] 77 (06/01 0459) Resp:  [16-18] 18 (06/01 0459) BP: (100-123)/(46-73) 103/66 mmHg (06/01 0459) SpO2:  [96 %-100 %] 100 % (06/01 0459)  Physical Exam:  General: alert, cooperative and no distress Lochia: appropriate Uterine Fundus: firm Incision: no significant drainage DVT Evaluation: No evidence of DVT seen on physical exam. No significant calf/ankle edema.   Recent Labs  11/05/15 0845 11/06/15 0548  HGB 11.2* 10.0*  HCT 32.5* 28.9*    Assessment/Plan: Status post Cesarean section. Doing well postoperatively.  Continue current care Recheck cbc at noon today - if still increasing may restart antibx; pt afeb Lovenox to start today at 1700- h/o dvt. UOP 1100ml this am  Sharol GivenCecilia Worema Ruey Storer 11/07/2015, 9:34 AM

## 2015-11-07 NOTE — Progress Notes (Signed)
Patient ID: Erin Robinsonsshley A Miley-Muhammad, female   DOB: 11/24/1985, 30 y.o.   MRN: [id number] Wbc down from 30 to 20...likely elevated due to stress and recent bmz  No further antibx needed

## 2015-11-07 NOTE — Progress Notes (Signed)
Pt was lying in bed and awake when I arrived. Her daughter, sister and young nephews were present. Present talked about her daughter and said she is doing well in NICU.  She talked about her early arrival and low birth weight and she talked of her progress since her birth. Pt said she was just sleepy and has not really slept since Monday. She said she wants to be w/her daughter. Pt appeared otherwise content and appreciative of CH visit. CH provided listening and offered support as needed. Please page should need arise. Chaplain Marjory LiesPamela Carrington Holder, M.Div.   11/06/15 1930  Clinical Encounter Type  Visited With Patient and family together

## 2015-11-07 NOTE — Lactation Note (Signed)
This note was copied from a baby's chart. Lactation Consultation Note  Follow up visit made with mom in NICU.  She states she is obtaining a few mls with pumping.  Mom is getting ready to hold baby for the first time.  Encouraged to pump after holding baby.  Encouraged to call with concerns.  Patient Name: Erin Orma Flamingshley Miley-Muhammad WUJWJ'XToday's Date: 11/07/2015     Maternal Data    Feeding Feeding Type: Donor Breast Milk Length of feed: 30 min  LATCH Score/Interventions                      Lactation Tools Discussed/Used     Consult Status      Huston FoleyMOULDEN, Medardo Hassing S 11/07/2015, 5:02 PM

## 2015-11-08 NOTE — Progress Notes (Addendum)
Assumed care from Lisa Scott, RN. 

## 2015-11-08 NOTE — Progress Notes (Signed)
Subjective: Postpartum Day 2: Cesarean Delivery Patient reports incisional pain, tolerating PO, + flatus and no problems voiding.    Objective: Vital signs in last 24 hours: Temp:  [98.1 F (36.7 C)-98.5 F (36.9 C)] 98.4 F (36.9 C) (06/02 0553) Pulse Rate:  [70-95] 70 (06/02 0553) Resp:  [16-18] 16 (06/02 0553) BP: (99-111)/(50-68) 104/50 mmHg (06/02 0553) SpO2:  [97 %-100 %] 97 % (06/02 0553)  Physical Exam:  General: alert, cooperative and no distress Lochia: appropriate Uterine Fundus: firm Incision: healing well, no significant drainage DVT Evaluation: No evidence of DVT seen on physical exam. No significant calf/ankle edema.   Recent Labs  11/06/15 0548 11/07/15 1136  HGB 10.0* 8.5*  HCT 28.9* 25.3*    Assessment/Plan: Status post Cesarean section. Doing well postoperatively.  Continue current care Abdominal binder prn On lovenox for h/o dvt  Edwinna AreolaCecilia Worema Aleeyah Bensen 11/08/2015, 7:15 AM

## 2015-11-08 NOTE — Progress Notes (Signed)
CSW attempted to meet with MOB in her third floor room/302 to offer support and complete assessment due to baby's admission to NICU at 29.6 weeks, but she was not in her room at this time.  CSW went to baby's bedside.  MOB present.  CSW introduced services and asked how MOB is feeling today.  She reports that she is doing much better now that "I'm not staring at those 4 walls," referring to her time in the OB High Risk Unit.  MOB was talking with RN and visiting with baby.  Others present in the pod at this time.  CSW gave contact information and stated that CSW will attempt to follow up with MOB at a later time.  CSW asked her to call if she has any questions, concerns or needs at any time.  MOB appeared to be in good spirits and agreed. 

## 2015-11-09 MED ORDER — OXYCODONE HCL 5 MG PO TABS: 5.0000 mg | ORAL_TABLET | ORAL | Status: DC | PRN

## 2015-11-09 MED ORDER — ENOXAPARIN SODIUM 40 MG/0.4ML ~~LOC~~ SOLN: 40.0000 mg | SUBCUTANEOUS | Status: DC

## 2015-11-09 MED ORDER — IBUPROFEN 600 MG PO TABS: 600.0000 mg | ORAL_TABLET | Freq: Four times a day (QID) | ORAL | Status: DC

## 2015-11-09 MED ORDER — ENOXAPARIN SODIUM 40 MG/0.4ML ~~LOC~~ SOLN
40.0000 mg | SUBCUTANEOUS | Status: DC
  Filled 2015-11-09: qty 0.4

## 2015-11-09 NOTE — Discharge Instructions (Signed)
As per discharge pamphlet °

## 2015-11-09 NOTE — Lactation Note (Signed)
This note was copied from a baby's chart. Lactation Consultation Note  Patient Name: Erin Boyd GNFAO'ZToday's Date: 11/09/2015 Reason for consult: Follow-up assessment;NICU baby;Infant < 6lbs Inf[facility] is 1082 hours old [facility]. Mom reports she has been pumping q 3-4 hours on initiate setting; mom had recently pumped & got a little less than 2.5 oz (mom reports this is the most she has received yet). Encouraged mom to use regular setting after she sees this volume for at least 2 more times. Mom reports that lately she has been pumping every 4 hours because she feels as though she gets more milk if she waits longer. Discussed importance of pumping at least 8 times in 24 hours and to not go too long in between pumping during the day to continue increasing her supply. Mom reports she was planning on going to buy a pump today after she is discharged; [facility] discussed importance of using a hospital grade pump, especially while baby is in the NICU. Mom stated she did not have WIC during pregnancy but that she has Medicaid. [facility]; provided [facility] & [facility] - encouraged mom to let her nurse know when she is done with the paperwork & LC can come back to give her the pump and fax [facility]. Mom stated she will need to discuss this with FOB first & then will let us know (Nurse has LC number to call once she has completed the paper work). Mom reports she has not done any skin-to-skin or BF yet; encouraged mom to continue asking when she will be able to & to [facility] when she latches in the NICU in the future for asssitance. Mom reports no questions at this time.   Maternal Data    Feeding Feeding Type: Breast Milk Length of feed: 30 min  LATCH Score/Interventions                      Lactation Tools Discussed/Used WIC Program: No (Is eligible & plans to apply after  discharge)   Consult Status Consult Status: Follow-up Date: 11/10/15 Follow-up type: In-patient    Oneal GroutLaura C Lilliane Sposito 11/09/2015, 12:08 PM

## 2015-11-09 NOTE — Progress Notes (Signed)

## 2015-11-09 NOTE — Lactation Note (Signed)
This note was copied from a baby's chart. Lactation Consultation Note  Patient Name: Erin Boyd ZOXWR'UToday's Date: 11/09/2015  [facility] ~4:30pm to talk with the nurse to see if [facility] and mom told nurse that she did and that someone was bringing the $30 at 5pm so LC encouraged the nurse to tell mom to [facility] when [facility] was there so LC could come finish the paperwork & get mom her DEBP. LC has not received a call; [facility] to let them know she may need a pump next week & that this LC did not hear from mom to [facility]. This LC informed other LC on shift of situation in case mom calls later tonight.    Maternal Data    Feeding Feeding Type: Breast Milk Length of feed: 30 min  LATCH Score/Interventions                      Lactation Tools Discussed/Used     Consult Status      Oneal GroutLaura C Elainah Rhyne 11/09/2015, 6:50 PM

## 2015-11-09 NOTE — Discharge Summary (Signed)
OB Discharge Summary     Patient Name: Erin Boyd DOB: 06-19-1985 MRN: [id number]  Date of admission: 10/09/2015 Delivering MD: Huel Cote   Date of discharge: 11/09/2015  Admitting diagnosis: 25WKS,PPROM Intrauterine pregnancy: [redacted]w[redacted]d     Secondary diagnosis:  Principal Problem:   Preterm premature rupture of membranes (PPROM) with unknown onset of labor Active Problems:   Breech birth  Additional problems: h/o UE DVT     Discharge diagnosis: Preterm Pregnancy Delivered                                                                                                Hospital course:  Pt admitted on 5-3 with PPROM.  She had a cerclage in place, was given steroids and antibiotics, also continued on Lovenox prophylaxis for h/o UE DVT.  She did well without problems until 5-31 when she started having ctx.  Her cerclage was removed, cervix was 2-3 cm dilated, breech presentation, she continued to contract.  She had LTCS on 5-31 for a viable female, 3 lbs 4 oz in breech presentation.  Placental pathology c/w chorioamnionitis and funisitis.  Pt had no post-op complications, was started back on Lovenox on POD #1, stable for discharge on POD #3, baby stable in NICU.  Physical exam  Filed Vitals:   11/08/15 0553 11/08/15 1312 11/08/15 2118 11/09/15 0601  BP: 104/50 128/70 110/56 100/52  Pulse: 70 87 106 81  Temp: 98.4 F (36.9 C) 98.8 F (37.1 C) 98.8 F (37.1 C) 98.4 F (36.9 C)  TempSrc: Oral Oral Oral Oral  Resp: Height:      Weight:      SpO2: 97% 98% 100% 100%   General: alert Lochia: appropriate Uterine Fundus: firm Incision: Healing well with no significant drainage  Labs: Lab Results  Component Value Date   WBC 20.3* 11/07/2015   HGB 8.5* 11/07/2015   HCT 25.3* 11/07/2015   MCV 86.6 11/07/2015   PLT 171 11/07/2015   CMP Latest Ref Rng 06/26/2015  Glucose 65 - 99 mg/dL 92  BUN 6 - 20 mg/dL 10  Creatinine 2.13 - 0.86 mg/dL 5.78  Sodium  469 - 629 mmol/L 134(L)  Potassium 3.5 - 5.1 mmol/L 3.5  Chloride 101 - 111 mmol/L 105  CO2 22 - 32 mmol/L 21(L)  Calcium 8.9 - 10.3 mg/dL 5.2(W)    Discharge instruction: per After Visit Summary and "Baby and Me Booklet".  After visit meds:    Medication List    STOP taking these medications        progesterone 200 MG Supp      TAKE these medications        calcium carbonate 750 MG chewable tablet  Commonly known as:  TUMS EX  Chew 2 tablets by mouth as needed for heartburn.     enoxaparin 40 MG/0.4ML injection  Commonly known as:  LOVENOX  Inject 0.4 mLs (40 mg total) into the skin daily.     ibuprofen 600 MG tablet  Commonly known as:  ADVIL,MOTRIN  Take 1 tablet (600 mg total) by  mouth every 6 (six) hours.     oxyCODONE 5 MG immediate release tablet  Commonly known as:  Oxy IR/ROXICODONE  Take 1 tablet (5 mg total) by mouth every 4 (four) hours as needed for severe pain.     prenatal multivitamin Tabs tablet  Take 1 tablet by mouth daily.        Diet: routine diet  Activity: Advance as tolerated. Pelvic rest for 6 weeks.   Outpatient follow up:2 weeks  Newborn Data: Live born female  Birth Weight: 3 lb 4.6 oz (1490 g) APGAR: 7, 8  Baby Feeding: Breast Disposition:NICU   11/09/2015 Zenaida NieceMEISINGER,Edra Riccardi D, MD

## 2015-11-09 NOTE — Plan of Care (Signed)
Problem: Bowel/Gastric: Goal: Gastrointestinal status will improve Outcome: Completed/Met Date Met:  11/09/15 Had BM on 11/07/15 and is passing flatus now.

## 2015-11-09 NOTE — Progress Notes (Signed)
POD #3 Doing ok, no n/v yesterday or today, baby stable in NICU Afeb, VSS Abd- soft, fundus firm, incision intact Continue routine care, continue Lovenox

## 2015-12-19 ENCOUNTER — Encounter (HOSPITAL_BASED_OUTPATIENT_CLINIC_OR_DEPARTMENT_OTHER): Payer: Self-pay | Admitting: *Deleted

## 2015-12-19 ENCOUNTER — Emergency Department (HOSPITAL_BASED_OUTPATIENT_CLINIC_OR_DEPARTMENT_OTHER)
Admission: EM | Admit: 2015-12-19 | Discharge: 2015-12-19 | Disposition: A | Discharge status: Home / Self Care | Payer: Medicaid Other | Attending: Physician Assistant | Admitting: Physician Assistant

## 2015-12-19 DIAGNOSIS — R509 Fever, unspecified: Secondary | ICD-10-CM | POA: Diagnosis not present

## 2015-12-19 DIAGNOSIS — Z87891 Personal history of nicotine dependence: Secondary | ICD-10-CM | POA: Insufficient documentation

## 2015-12-19 DIAGNOSIS — J020 Streptococcal pharyngitis: Secondary | ICD-10-CM | POA: Insufficient documentation

## 2015-12-19 DIAGNOSIS — O9953 Diseases of the respiratory system complicating the puerperium: Secondary | ICD-10-CM | POA: Insufficient documentation

## 2015-12-19 DIAGNOSIS — O9989 Other specified diseases and conditions complicating pregnancy, childbirth and the puerperium: Secondary | ICD-10-CM | POA: Diagnosis present

## 2015-12-19 LAB — RAPID STREP SCREEN (MED CTR MEBANE ONLY): STREPTOCOCCUS, GROUP A SCREEN (DIRECT): NEGATIVE

## 2015-12-19 MED ORDER — PENICILLIN G BENZATHINE 1200000 UNIT/2ML IM SUSP
1.2000 10*6.[IU] | Freq: Once | INTRAMUSCULAR | Status: AC
  Administered 2015-12-19: 1.2 10*6.[IU] via INTRAMUSCULAR
  Filled 2015-12-19: qty 2

## 2015-12-19 MED ORDER — IBUPROFEN 800 MG PO TABS
800.0000 mg | ORAL_TABLET | Freq: Once | ORAL | Status: AC
  Administered 2015-12-19: 800 mg via ORAL
  Filled 2015-12-19: qty 1

## 2015-12-19 NOTE — ED Notes (Signed)
Pt amb to triage with quick steady gait in nad. Pt reports fever up to 103, body aches, chills and sore throat x last night. Pt [facility] and has been there every day and wonders if she picked up a germ there.

## 2015-12-19 NOTE — ED Notes (Signed)
Pt given 3 gingerales and 1 water per her request.

## 2015-12-19 NOTE — ED Provider Notes (Signed)
CSN: [id number]     Arrival date & time 12/19/15  [id number] History   First MD Initiated Contact with Patient 12/19/15 0900     Chief Complaint  Patient presents with  . Fever     (Consider location/radiation/quality/duration/timing/severity/associated sxs/prior Treatment) HPI   Patietn is a 30 year old female presenting with fever and sore throat. Patient is 6 weeks post partum from 629 weeker, pregnancy complicated by DVT in UE. Patient on blood thinner. Currently pumping. SOre throat X 2 days, fever and chills starting last ngith.   No n/v/d, no neck pain or meningimus, no vaginal discharge or UE swelling.  Past Medical History  Diagnosis Date  . No pertinent past medical history   . Cervical abnormality affecting pregnancy, antepartum 08/25/2015  . Heartburn in pregnancy   . Headache     otc med prn  . DVT (deep vein thrombosis) in pregnancy     dx clot in right arm in Jan 2017.  Been off Lovenox since Friday 08/23/15   . Short cervix with cervical cerclage in second trimester, antepartum 09/06/2015   Past Surgical History  Procedure Laterality Date  . Pediatric dental surgery    . Induced abortion    . Wisdom tooth extraction    . Cervical cerclage N/A 09/06/2015    Procedure: CERCLAGE CERVICAL;  Surgeon: Sherian ReinJody [name], MD;  Location: [facility];  Service: Gynecology;  Laterality: N/A;  . Cesarean section N/A 11/06/2015    Procedure: CESAREAN SECTION;  Surgeon: Huel CoteKathy Richardson, MD;  [facility];  Service: Obstetrics;  Laterality: N/A;   Family History  Problem Relation Age of Onset  . Clotting disorder Neg Hx    Social History  Substance Use Topics  . Smoking status: Former Smoker -- 0.50 packs/day for 7 years    Types: Cigarettes    Quit date: 06/08/2014  . Smokeless tobacco: Never Used  . Alcohol Use: 0.0 oz/week     Comment: occasional wine - none with pregnancy   OB History    Gravida Para Term Preterm AB TAB SAB Ectopic Multiple Living   6 2 1 1  3 2 1  0 0 1     Review of Systems  Constitutional: Negative for activity change.  HENT: Positive for sore throat. Negative for congestion, ear pain, facial swelling and rhinorrhea.   Respiratory: Negative for cough and shortness of breath.   Cardiovascular: Negative for chest pain.  Gastrointestinal: Negative for abdominal pain.  Genitourinary: Negative for dysuria, vaginal discharge, menstrual problem and pelvic pain.  Musculoskeletal: Negative for back pain and neck pain.  Psychiatric/Behavioral: Negative for agitation.      Allergies  Latex  Home Medications   Prior to Admission medications   Medication Sig Start Date End Date Taking? Authorizing Provider  calcium carbonate (TUMS EX) 750 MG chewable tablet Chew 2 tablets by mouth as needed for heartburn.    Historical Provider, MD  enoxaparin (LOVENOX) 40 MG/0.4ML injection Inject 0.4 mLs (40 mg total) into the skin daily. 11/09/15   Lavina Hammanodd Meisinger, MD  ibuprofen (ADVIL,MOTRIN) 600 MG tablet Take 1 tablet (600 mg total) by mouth every 6 (six) hours. 11/09/15   Lavina Hammanodd Meisinger, MD  oxyCODONE (OXY IR/ROXICODONE) 5 MG immediate release tablet Take 1 tablet (5 mg total) by mouth every 4 (four) hours as needed for severe pain. 11/09/15   Lavina Hammanodd Meisinger, MD  Prenatal Vit-Fe Fumarate-FA (PRENATAL MULTIVITAMIN) TABS tablet Take 1 tablet by mouth daily.     Historical Provider, MD  BP 118/67 mmHg  Pulse 89  Temp(Src) 99.3 F (37.4 C) (Oral)  Resp 16  Ht  (1.778 m)  Wt 220 lb (99.791 kg)  BMI 31.57 kg/m2  SpO2 98% Physical Exam  Constitutional: She is oriented to person, place, and time. She appears well-developed and well-nourished.  HENT:  Head: Normocephalic and atraumatic.  ertyhma in oral pharynx, white exudate, swelling  Eyes: Conjunctivae are normal. Right eye exhibits no discharge.  Neck: Neck supple.  Cardiovascular: Normal rate, regular rhythm and normal heart sounds.   No murmur heard. Pulmonary/Chest: Effort normal  and breath sounds normal. She has no wheezes. She has no rales.  Abdominal: Soft. She exhibits no distension. There is no tenderness.  Musculoskeletal: Normal range of motion. She exhibits no edema.  Neurological: She is oriented to person, place, and time. No cranial nerve deficit.  Skin: Skin is warm and dry. No rash noted. She is not diaphoretic.  Psychiatric: She has a normal mood and affect. Her behavior is normal.  Nursing note and vitals reviewed.   ED Course  Procedures (including critical care time) Labs Review Labs Reviewed  RAPID STREP SCREEN (NOT AT [facility])  CULTURE, GROUP A STREP [facility])    Imaging Review No results found. I have personally reviewed and evaluated these images and lab results as part of my medical decision-making.   EKG Interpretation None      MDM   Final diagnoses:  Strep throat    Pt is a 30 yo 6 week pp of 29 week premature after 6 week hospitlization for PPROM here with fever and sore throat.  Patietn has exudates, ertyehma, and fever. Strongly suspect strep throat.  Awaitign test.  Will treat with , penicillin, tyelnol/ibuprofen.   Negative Strep. But given + centor and recurrent strep in this patient as well as necessity to treat conservatively given her NICU baby, will treat with bicillin. No decadron given lactmed recommendations.  Plan discussed with patietn.    Jannice Beitzel Randall An, MD 12/19/15 1410

## 2015-12-19 NOTE — ED Notes (Signed)
Pt has drank 2 ginger ales and tolerated well.

## 2015-12-19 NOTE — Discharge Instructions (Signed)
Take ibuprofen and tyelnol as needed. You should improve in the next 24 hours, if not, return to the ED. Also throw away your toothbrush.  Pharyngitis Pharyngitis is redness, pain, and swelling (inflammation) of your pharynx.  CAUSES  Pharyngitis is usually caused by infection. Most of the time, these infections are from viruses (viral) and are part of a cold. However, sometimes pharyngitis is caused by bacteria (bacterial). Pharyngitis can also be caused by allergies. Viral pharyngitis may be spread from person to person by coughing, sneezing, and personal items or utensils (cups, forks, spoons, toothbrushes). Bacterial pharyngitis may be spread from person to person by more intimate contact, such as kissing.  SIGNS AND SYMPTOMS  Symptoms of pharyngitis include:   Sore throat.   Tiredness (fatigue).   Low-grade fever.   Headache.  Joint pain and muscle aches.  Skin rashes.  Swollen lymph nodes.  Plaque-like film on throat or tonsils (often seen with bacterial pharyngitis). DIAGNOSIS  Your health care provider will ask you questions about your illness and your symptoms. Your medical history, along with a physical exam, is often all that is needed to diagnose pharyngitis. Sometimes, a rapid strep test is done. Other lab tests may also be done, depending on the suspected cause.  TREATMENT  Viral pharyngitis will usually get better in 3-4 days without the use of medicine. Bacterial pharyngitis is treated with medicines that kill germs (antibiotics).  HOME CARE INSTRUCTIONS   Drink enough water and fluids to keep your urine clear or pale yellow.   Only take over-the-counter or prescription medicines as directed by your health care provider:   If you are prescribed antibiotics, make sure you finish them even if you start to feel better.   Do not take aspirin.   Get lots of rest.   Gargle with 8 oz of salt water ( tsp of salt per 1 qt of water) as often as every 1-2 hours  to soothe your throat.   Throat lozenges (if you are not at risk for choking) or sprays may be used to soothe your throat. SEEK MEDICAL CARE IF:   You have large, tender lumps in your neck.  You have a rash.  You cough up green, yellow-brown, or bloody spit. SEEK IMMEDIATE MEDICAL CARE IF:   Your neck becomes stiff.  You drool or are unable to swallow liquids.  You vomit or are unable to keep medicines or liquids down.  You have severe pain that does not go away with the use of recommended medicines.  You have trouble breathing (not caused by a stuffy nose). MAKE SURE YOU:   Understand these instructions.  Will watch your condition.  Will get help right away if you are not doing well or get worse.   This information is not intended to replace advice given to you by your health care provider. Make sure you discuss any questions you have with your health care provider.   Document Released: 05/25/2005 Document Revised: 03/15/2013 Document Reviewed: 01/30/2013 Elsevier Interactive Patient Education 2016 Elsevier Inc.  Rapid Strep Test Strep throat is a bacterial infection caused by the bacteria Streptococcus pyogenes. A rapid strep test is the quickest way to check if these bacteria are causing your sore throat. The test can be done at your health care provider's office. Results are usually ready in 10-20 minutes. You may have this test if you have symptoms of strep throat. These include:   A red throat with yellow or white spots.  Neck swelling  and tenderness.  Fever.  Loss of appetite.  Trouble breathing or swallowing.  Rash.  Dehydration. This test requires a sample of fluid from the back of your throat and tonsils. Your health care provider may hold down your tongue with a tongue depressor and use a swab to collect the sample.  Your health care provider may collect a second sample at the same time. The second sample may be used for a throat culture. In a culture  test, the sample is combined with a substance that encourages bacteria to grow. It takes longer to get the results of the throat culture test, but they are more accurate. They can confirm the results from a rapid strep test, or show that those results were wrong. RESULTS  It is your responsibility to obtain your test results. Ask the lab or department performing the test when and how you will get your results. Contact your health care provider to discuss any questions you have about your results.  The results of the rapid strep test will be negative or positive.  Meaning of Negative Test Results If the result of your rapid strep test is negative, then it means:   It is likely that you do not have strep throat.  A virus may be causing your sore throat. Your health care provider may do a throat culture to confirm the results of the rapid strep test. The throat culture can also identify the different strains of strep bacteria. Meaning of Positive Test Results If the result of your rapid strep test is positive, then it means:  It is likely that you do have strep throat.  You may have to take antibiotics. Your health care provider may do a throat culture to confirm the results of the rapid strep test. Strep throat usually requires a course of antibiotics.    This information is not intended to replace advice given to you by your health care provider. Make sure you discuss any questions you have with your health care provider.   Document Released: 07/02/2004 Document Revised: 06/15/2014 Document Reviewed: 08/31/2013 Elsevier Interactive Patient Education [employer] Elsevier Inc.

## 2015-12-20 ENCOUNTER — Encounter (HOSPITAL_BASED_OUTPATIENT_CLINIC_OR_DEPARTMENT_OTHER): Payer: Self-pay | Admitting: *Deleted

## 2015-12-20 DIAGNOSIS — Z87891 Personal history of nicotine dependence: Secondary | ICD-10-CM | POA: Insufficient documentation

## 2015-12-20 DIAGNOSIS — J029 Acute pharyngitis, unspecified: Secondary | ICD-10-CM | POA: Diagnosis not present

## 2015-12-20 DIAGNOSIS — R509 Fever, unspecified: Secondary | ICD-10-CM | POA: Diagnosis present

## 2015-12-20 MED ORDER — ACETAMINOPHEN 500 MG PO TABS
1000.0000 mg | ORAL_TABLET | Freq: Once | ORAL | Status: AC
  Administered 2015-12-20: 1000 mg via ORAL
  Filled 2015-12-20: qty 2

## 2015-12-20 NOTE — ED Notes (Signed)
Pt reports being treated for strep yesterday with a shot.  States that fever will not break today.  States fever of 104.  Last took 400mg  ibuprofen at 6pm.  Tylenol at 3pm.

## 2015-12-21 ENCOUNTER — Emergency Department (HOSPITAL_BASED_OUTPATIENT_CLINIC_OR_DEPARTMENT_OTHER)
Admission: EM | Admit: 2015-12-21 | Discharge: 2015-12-21 | Disposition: A | Discharge status: Home / Self Care | Payer: Medicaid Other | Attending: Emergency Medicine | Admitting: Emergency Medicine

## 2015-12-21 DIAGNOSIS — R509 Fever, unspecified: Secondary | ICD-10-CM

## 2015-12-21 DIAGNOSIS — J029 Acute pharyngitis, unspecified: Secondary | ICD-10-CM

## 2015-12-21 LAB — URINE MICROSCOPIC-ADD ON

## 2015-12-21 LAB — URINALYSIS, ROUTINE W REFLEX MICROSCOPIC
BILIRUBIN URINE: NEGATIVE
GLUCOSE, UA: NEGATIVE mg/dL
HGB URINE DIPSTICK: NEGATIVE
Ketones, ur: NEGATIVE mg/dL
Leukocytes, UA: NEGATIVE
Nitrite: NEGATIVE
PH: 5.5 (ref 5.0–8.0)
Protein, ur: 30 mg/dL — AB
SPECIFIC GRAVITY, URINE: 1.019 (ref 1.005–1.030)

## 2015-12-21 LAB — MONONUCLEOSIS SCREEN: MONO SCREEN: NEGATIVE

## 2015-12-21 MED ORDER — IBUPROFEN 800 MG PO TABS: 800.0000 mg | ORAL_TABLET | Freq: Three times a day (TID) | ORAL | Status: DC

## 2015-12-21 NOTE — ED Provider Notes (Signed)
CSN: [id number]     Arrival date & time 12/20/15  2207 History   First MD Initiated Contact with Patient 12/21/15 0008     Chief Complaint  Patient presents with  . Fever     (Consider location/radiation/quality/duration/timing/severity/associated sxs/prior Treatment) Patient is a 30 y.o. female presenting with fever. The history is provided by the patient.  Fever Temp source:  Oral Severity:  Moderate Onset quality:  Gradual Duration:  3 days Timing:  Constant Progression:  Unchanged Chronicity:  New Relieved by:  Nothing Worsened by:  Nothing tried Ineffective treatments:  Acetaminophen Associated symptoms: sore throat   Associated symptoms: no chest pain, no confusion, no congestion, no cough, no diarrhea, no dysuria, no nausea, no rash and no vomiting   Risk factors: no hx of cancer   Already given PCN IM for strep but fever persists today.  No additional symptoms  Past Medical History  Diagnosis Date  . No pertinent past medical history   . Cervical abnormality affecting pregnancy, antepartum 08/25/2015  . Heartburn in pregnancy   . Headache     otc med prn  . DVT (deep vein thrombosis) in pregnancy     dx clot in right arm in Jan 2017.  Been off Lovenox since Friday 08/23/15   . Short cervix with cervical cerclage in second trimester, antepartum 09/06/2015   Past Surgical History  Procedure Laterality Date  . Pediatric dental surgery    . Induced abortion    . Wisdom tooth extraction    . Cervical cerclage N/A 09/06/2015    Procedure: CERCLAGE CERVICAL;  Surgeon: Sherian ReinJody [name], MD;  Location: [facility];  Service: Gynecology;  Laterality: N/A;  . Cesarean section N/A 11/06/2015    Procedure: CESAREAN SECTION;  Surgeon: Huel CoteKathy Richardson, MD;  [facility];  Service: Obstetrics;  Laterality: N/A;   Family History  Problem Relation Age of Onset  . Clotting disorder Neg Hx    Social History  Substance Use Topics  . Smoking status: Former Smoker --  0.50 packs/day for 7 years    Types: Cigarettes    Quit date: 06/08/2014  . Smokeless tobacco: Never Used  . Alcohol Use: 0.0 oz/week     Comment: occasional wine - none with pregnancy   OB History    Gravida Para Term Preterm AB TAB SAB Ectopic Multiple Living   6 2 1 1 3 2 1  0 0 1     Review of Systems  Constitutional: Positive for fever.  HENT: Positive for sore throat. Negative for congestion, drooling and ear discharge.   Respiratory: Negative for cough.   Cardiovascular: Negative for chest pain, palpitations and leg swelling.  Gastrointestinal: Negative for nausea, vomiting and diarrhea.  Genitourinary: Negative for dysuria.  Skin: Negative for rash.  Psychiatric/Behavioral: Negative for confusion.  All other systems reviewed and are negative.     Allergies  Latex  Home Medications   Prior to Admission medications   Medication Sig Start Date End Date Taking? Authorizing Provider  calcium carbonate (TUMS EX) 750 MG chewable tablet Chew 2 tablets by mouth as needed for heartburn.    Historical Provider, MD  enoxaparin (LOVENOX) 40 MG/0.4ML injection Inject 0.4 mLs (40 mg total) into the skin daily. 11/09/15   Lavina Hammanodd Meisinger, MD  ibuprofen (ADVIL,MOTRIN) 600 MG tablet Take 1 tablet (600 mg total) by mouth every 6 (six) hours. 11/09/15   Lavina Hammanodd Meisinger, MD  oxyCODONE (OXY IR/ROXICODONE) 5 MG immediate release tablet Take 1 tablet (5 mg  total) by mouth every 4 (four) hours as needed for severe pain. 11/09/15   Lavina Hamman, MD  Prenatal Vit-Fe Fumarate-FA (PRENATAL MULTIVITAMIN) TABS tablet Take 1 tablet by mouth daily.     Historical Provider, MD   BP 130/91 mmHg  Pulse 114  Temp(Src) 99.9 F (37.7 C) (Oral)  Resp 18  Ht  (1.778 m)  Wt 220 lb (99.791 kg)  BMI 31.57 kg/m2  SpO2 100% Physical Exam  Constitutional: She is oriented to person, place, and time. She appears well-developed and well-nourished. No distress.  HENT:  Head: Normocephalic and atraumatic.   Mouth/Throat: Oropharyngeal exudate present.  Eyes: Conjunctivae are normal. Pupils are equal, round, and reactive to light.  Neck: Normal range of motion. Neck supple.  Cardiovascular: Normal rate, regular rhythm and intact distal pulses.   Pulmonary/Chest: Effort normal and breath sounds normal. No stridor. No respiratory distress. She has no wheezes. She has no rales.  Abdominal: Soft. Bowel sounds are normal. There is no tenderness. There is no rebound and no guarding.  Musculoskeletal: Normal range of motion.  Lymphadenopathy:    She has no cervical adenopathy.  Neurological: She is alert and oriented to person, place, and time.  Skin: Skin is warm and dry.  Psychiatric: She has a normal mood and affect.    ED Course  Procedures (including critical care time) Labs Review Labs Reviewed  URINALYSIS, ROUTINE W REFLEX MICROSCOPIC (NOT AT [facility]) - Abnormal; Notable for the following:    Protein, ur 30 (*)    All other components within normal limits  URINE MICROSCOPIC-ADD ON - Abnormal; Notable for the following:    Squamous Epithelial / LPF 0-5 (*)    Bacteria, UA FEW (*)    All other components within normal limits  MONONUCLEOSIS SCREEN    Imaging Review No results found. I have personally reviewed and evaluated these images and lab results as part of my medical decision-making.   EKG Interpretation None      MDM   Final diagnoses:  None   Filed Vitals:   12/20/15 2317 12/21/15 0024  BP:  130/91  Pulse:  114  Temp: 102.1 F (38.9 C) 99.9 F (37.7 C)  Resp:  18   Results for orders placed or performed during the hospital encounter of 12/21/15  Urinalysis, Routine w reflex microscopic (not at [facility])  Result Value Ref Range   Color, Urine YELLOW YELLOW   APPearance CLEAR CLEAR   Specific Gravity, Urine 1.019 1.005 - 1.030   pH 5.5 5.0 - 8.0   Glucose, UA NEGATIVE NEGATIVE mg/dL   Hgb urine dipstick NEGATIVE NEGATIVE   Bilirubin Urine NEGATIVE NEGATIVE    Ketones, ur NEGATIVE NEGATIVE mg/dL   Protein, ur 30 (A) NEGATIVE mg/dL   Nitrite NEGATIVE NEGATIVE   Leukocytes, UA NEGATIVE NEGATIVE  Mononucleosis screen  Result Value Ref Range   Mono Screen NEGATIVE NEGATIVE  Urine microscopic-add on  Result Value Ref Range   Squamous Epithelial / LPF 0-5 (A) NONE SEEN   WBC, UA 0-5 0 - 5 WBC/hpf   RBC / HPF 0-5 0 - 5 RBC/hpf   Bacteria, UA FEW (A) NONE SEEN   Urine-Other MUCOUS PRESENT    No results found.  Medications  acetaminophen (TYLENOL) tablet 1,000 mg (1,000 mg Oral Given 12/20/15 2234)    If it is strep PCN still has not had time to be active in patient's body.  I suspect this time viral though.  Exam is benign and reassuring.  Alternate tylenol and Ibuprofen Q 4 hrs for fever control.  Drink lots of fluids.  Based on history and exam patient has been appropriately medically screened and emergency conditions excluded. Patient is stable for discharge at this time.   Follow up provided and strict return precautions given.      Cy Blamer, MD 12/21/15 [phone]

## 2015-12-22 LAB — CULTURE, GROUP A STREP (THRC)

## 2015-12-23 ENCOUNTER — Emergency Department (HOSPITAL_BASED_OUTPATIENT_CLINIC_OR_DEPARTMENT_OTHER): Payer: Medicaid Other

## 2015-12-23 ENCOUNTER — Emergency Department (HOSPITAL_BASED_OUTPATIENT_CLINIC_OR_DEPARTMENT_OTHER)
Admission: EM | Admit: 2015-12-23 | Discharge: 2015-12-23 | Disposition: A | Discharge status: Home / Self Care | Payer: Medicaid Other | Attending: Emergency Medicine | Admitting: Emergency Medicine

## 2015-12-23 ENCOUNTER — Encounter (HOSPITAL_BASED_OUTPATIENT_CLINIC_OR_DEPARTMENT_OTHER): Payer: Self-pay | Admitting: *Deleted

## 2015-12-23 DIAGNOSIS — R5383 Other fatigue: Secondary | ICD-10-CM | POA: Insufficient documentation

## 2015-12-23 DIAGNOSIS — Z87891 Personal history of nicotine dependence: Secondary | ICD-10-CM | POA: Diagnosis not present

## 2015-12-23 DIAGNOSIS — J029 Acute pharyngitis, unspecified: Secondary | ICD-10-CM | POA: Insufficient documentation

## 2015-12-23 MED ORDER — AMOXICILLIN-POT CLAVULANATE 875-125 MG PO TABS: 1.0000 | ORAL_TABLET | Freq: Two times a day (BID) | ORAL | Status: DC

## 2015-12-23 MED ORDER — SODIUM CHLORIDE 0.9 % IV BOLUS (SEPSIS)
1000.0000 mL | Freq: Once | INTRAVENOUS | Status: AC
  Administered 2015-12-23: 1000 mL via INTRAVENOUS

## 2015-12-23 MED ORDER — METHYLPREDNISOLONE 4 MG PO TBPK: ORAL_TABLET | ORAL | Status: DC

## 2015-12-23 MED ORDER — ACETAMINOPHEN 500 MG PO TABS
1000.0000 mg | ORAL_TABLET | Freq: Once | ORAL | Status: AC
  Administered 2015-12-23: 1000 mg via ORAL
  Filled 2015-12-23: qty 2

## 2015-12-23 MED ORDER — IOPAMIDOL (ISOVUE-300) INJECTION 61%
100.0000 mL | Freq: Once | INTRAVENOUS | Status: AC | PRN
  Administered 2015-12-23: 100 mL via INTRAVENOUS

## 2015-12-23 MED ORDER — AZITHROMYCIN 250 MG PO TABS
1000.0000 mg | ORAL_TABLET | Freq: Once | ORAL | Status: AC
  Administered 2015-12-23: 1000 mg via ORAL
  Filled 2015-12-23: qty 4

## 2015-12-23 MED ORDER — CEFTRIAXONE SODIUM 250 MG IJ SOLR
250.0000 mg | Freq: Once | INTRAMUSCULAR | Status: AC
  Administered 2015-12-23: 250 mg via INTRAMUSCULAR
  Filled 2015-12-23: qty 250

## 2015-12-23 MED FILL — METHYLPREDNISOLONE 4 MG TAB: 4 | 6 days supply | Qty: 21 | Fill #0

## 2015-12-23 MED FILL — AMOX-CLAV 875-125 MG TABLET: 875-125 | 7 days supply | Qty: 14 | Fill #0

## 2015-12-23 NOTE — Discharge Instructions (Signed)

## 2015-12-23 NOTE — ED Notes (Signed)
Pt seen here last week and dx with strep, given pcn injection, pt states she still cannot swallow, has pain with talking and eating, and cont with fevers up to 103., at 2am, she took motrin at 2am for 103 fever.

## 2015-12-23 NOTE — ED Provider Notes (Addendum)
CSN: [id number]     Arrival date & time 12/23/15  8295 History   First MD Initiated Contact with Patient 12/23/15 1000     Chief Complaint  Patient presents with  . Fever     (Consider location/radiation/quality/duration/timing/severity/associated sxs/prior Treatment) Patient is a 30 y.o. female presenting with pharyngitis.  Sore Throat This is a new problem. The current episode started more than 1 week ago. The problem occurs constantly. The problem has been gradually worsening. Associated symptoms include shortness of breath. Pertinent negatives include no chest pain, no abdominal pain and no headaches. Nothing aggravates the symptoms. Nothing relieves the symptoms. Treatments tried: Penicillin, nsaids. The treatment provided no relief.    Past Medical History  Diagnosis Date  . No pertinent past medical history   . Cervical abnormality affecting pregnancy, antepartum 08/25/2015  . Heartburn in pregnancy   . Headache     otc med prn  . DVT (deep vein thrombosis) in pregnancy     dx clot in right arm in Jan 2017.  Been off Lovenox since Friday 08/23/15   . Short cervix with cervical cerclage in second trimester, antepartum 09/06/2015   Past Surgical History  Procedure Laterality Date  . Pediatric dental surgery    . Induced abortion    . Wisdom tooth extraction    . Cervical cerclage N/A 09/06/2015    Procedure: CERCLAGE CERVICAL;  Surgeon: Sherian Rein, MD;  Location: [facility];  Service: Gynecology;  Laterality: N/A;  . Cesarean section N/A 11/06/2015    Procedure: CESAREAN SECTION;  Surgeon: Huel Cote, MD;  Location: [facility] BIRTHING SU[facility];  Service: Obstetrics;  Laterality: N/A;   Family History  Problem Relation Age of Onset  . Clotting disorder Neg Hx    Social History  Substance Use Topics  . Smoking status: Former Smoker -- 0.50 packs/day for 7 years    Types: Cigarettes    Quit date: 06/08/2014  . Smokeless tobacco: Never Used  . Alcohol Use: 0.0 oz/week      Comment: occasional wine - none with pregnancy   OB History    Gravida Para Term Preterm AB TAB SAB Ectopic Multiple Living   0 0 1     Review of Systems  Constitutional: Positive for fever and fatigue. Negative for chills.  HENT: Positive for sore throat and voice change. Negative for congestion, ear discharge, ear pain, rhinorrhea and trouble swallowing.   Eyes: Negative for redness and visual disturbance.  Respiratory: Positive for shortness of breath. Negative for cough and wheezing.   Cardiovascular: Negative for chest pain and leg swelling.  Gastrointestinal: Negative for nausea, vomiting, abdominal pain and diarrhea.  Genitourinary: Negative for dysuria and flank pain.  Musculoskeletal: Negative for neck pain.  Skin: Negative for rash.  Neurological: Negative for syncope, weakness and headaches.      Allergies  Latex  Home Medications   Prior to Admission medications   Medication Sig Start Date End Date Taking? Authorizing Provider  amoxicillin-clavulanate (AUGMENTIN) 875-125 MG tablet Take 1 tablet by mouth every 12 (twelve) hours. 12/23/15   Shaune Pollack, MD  calcium carbonate (TUMS EX) 750 MG chewable tablet Chew 2 tablets by mouth as needed for heartburn.    Historical Provider, MD  enoxaparin (LOVENOX) 40 MG/0.4ML injection Inject 0.4 mLs (40 mg total) into the skin daily. 11/09/15   Lavina Hamman, MD  ibuprofen (ADVIL,MOTRIN) 600 MG tablet Take 1 tablet (600 mg total) by mouth every 6 (six) hours.  11/09/15   Lavina Hamman, MD  ibuprofen (ADVIL,MOTRIN) 800 MG tablet Take 1 tablet (800 mg total) by mouth 3 (three) times daily. 12/21/15   April Palumbo, MD  methylPREDNISolone (MEDROL DOSEPAK) 4 MG TBPK tablet As per package 12/23/15   Shaune Pollack, MD  oxyCODONE (OXY IR/ROXICODONE) 5 MG immediate release tablet Take 1 tablet (5 mg total) by mouth every 4 (four) hours as needed for severe pain. 11/09/15   Lavina Hamman, MD  Prenatal Vit-Fe Fumarate-FA  (PRENATAL MULTIVITAMIN) TABS tablet Take 1 tablet by mouth daily.     Historical Provider, MD   BP 118/69 mmHg  Pulse 79  Temp(Src) 99.3 F (37.4 C) (Oral)  Resp 18  Ht 5' 9.5" (1.765 m)  Wt 220 lb (99.791 kg)  BMI 32.03 kg/m2  SpO2 98% Physical Exam  Constitutional: She appears well-developed and well-nourished. No distress.  HENT:  Head: Normocephalic and atraumatic.  Right Ear: Tympanic membrane and external ear normal.  Left Ear: Tympanic membrane and external ear normal.  Nose: No mucosal edema or rhinorrhea.  Mouth/Throat: Posterior oropharyngeal edema and posterior oropharyngeal erythema present. No oropharyngeal exudate.  Marked tonsillar swelling with 3+ tonsillar edema, mild uvular edema but OP patent. No trismus. No peritonsillar asymmetry.  Eyes: Conjunctivae are normal. Pupils are equal, round, and reactive to light.  Neck: Normal range of motion. Neck supple.  Cardiovascular: Normal rate, regular rhythm, normal heart sounds and intact distal pulses.  Exam reveals no friction rub.   No murmur heard. Pulmonary/Chest: Effort normal. No respiratory distress. She has no wheezes. She has no rales.  Abdominal: Soft. She exhibits no distension. There is no tenderness.  Musculoskeletal: She exhibits no edema.  Neurological: She is alert.  Skin: Skin is warm. No rash noted.  Nursing note and vitals reviewed.   ED Course  Procedures (including critical care time) Labs Review Labs Reviewed  GC/CHLAMYDIA PROBE AMP ([place]) NOT AT [facility]  CYTOLOGY, (ORAL, ANAL, URETHRAL) ANCILLARY ONLY    Imaging Review Dg Chest 2 View  12/23/2015  CLINICAL DATA:  30 year old female with persistent fever. Bilateral tonsillar swelling and drainage into the throat. EXAM: CHEST  2 VIEW COMPARISON:  Chest x-ray 08/02/2007. FINDINGS: Lung volumes are normal. No consolidative airspace disease. No pleural effusions. No pneumothorax. No pulmonary nodule or mass noted. Pulmonary vasculature and  the cardiomediastinal silhouette are within normal limits. IMPRESSION: No radiographic evidence of acute cardiopulmonary disease. Electronically Signed   By: Trudie Reed M.D.   On: 12/23/2015 11:17   Ct Soft Tissue Neck W Contrast  12/23/2015  CLINICAL DATA:  30 y/o F; persistent fever and bilateral tonsillar swelling despite antibiotics with concern for abscess. EXAM: CT NECK WITH CONTRAST TECHNIQUE: Multidetector CT imaging of the neck was performed using the standard protocol following the bolus administration of intravenous contrast. CONTRAST:  ISOVUE-300 IOPAMIDOL (ISOVUE-300) INJECTION 61% COMPARISON:  None. FINDINGS: Pharynx and larynx: Marked enlargement of the palatini tonsils and thickening of the oropharyngeal mucosa, left greater than right. Partial effacement of the patent airway. The tonsils are heterogeneous but no discrete fluid collection is identified. No inflammatory changes of the parapharyngeal fat. No retropharyngeal/ prevertebral fluid collection. The larynx is unremarkable. The subglottic airway is unremarkable. The nasopharynx is unremarkable. Salivary glands: Normal. Thyroid: Normal. Lymph nodes: Prominent left-greater-than-right upper cervical lymph nodes the largest in the left level 2 station measuring up to 15 x 20 mm axially, series 2, image 53. Vascular: Normal. Limited intracranial: Normal. Visualized orbits: Normal. Mastoids and visualized  paranasal sinuses: Right maxillary sinus mucous retention cyst, otherwise the visualized paranasal sinuses and mastoid air cells are clear. Hypoplastic frontal sinuses bilaterally. Skeleton: Mild positional reversal of cervical lordosis. Bones are unremarkable. Upper chest: Clear lungs. IMPRESSION: Marked enlargement of palatine tonsils and pharyngeal mucosal thickening consistent with pharyngitis. No discrete fluid collection is identified to suggest abscess. Upper cervical lymphadenopathy is likely reactive. Electronically Signed    By: Mitzi HansenLance  Furusawa-Stratton M.D.   On: 12/23/2015 11:19   I have personally reviewed and evaluated these images and lab results as part of my medical decision-making.   EKG Interpretation   Date/Time:  Monday December 23 2015 11:28:31 EDT Ventricular Rate:  83 PR Interval:  136 QRS Duration: 98 QT Interval:  384 QTC Calculation: 451 R Axis:   1 Text Interpretation:  Normal sinus rhythm Inferior infarct , age  undetermined Anterior infarct , age undetermined Abnormal ECG No  significant change since last tracing Confirmed by Tyrone AppleNGUYEN, EMILY ([id number])  on 12/23/2015 4:37:23 PM      MDM  30 yo F with PMHx of recent SVD and diagnosis of strep pharyngitis who p/w persistent sore throat, fevers, and generalized malaise. Given IM PCN, supportive care previously with persistence of fevers and sore throat, now c/o "fullness" in back of throat. No steroids given as pt was breastfeeding. On arrival, VSS and WNL. Exam is as above. Given persistent sore throat, fevers, and marked oral swelling, c/f possible tonsillar abscess, RPA. No asymmetry of uvula or signs of uvulitis or PTA. No HA, neck stiffness, and pt overall well-appearing w/o signs of meningitis or encephalits .Given persistence of fevers, worsening sore throat, will obtain CT Neck to eval for abscess and CXR to eval PNA. No stridor or signs of airway compromise. Do not suspect epiglottitis.  CTA shows tonsillitis w/o abscess. CXR clear. Will treat with augmentin for possible persistent strep infection. Of note, tp also with significant amount of oral sex with female partner (insertive) due to pregnancy. Concern for possible gonococcal pharyngitis. Will treat accordingly and discussed with pt. Also discussed risks/benefits of steroids while breastfeeding, particularly with premature neonate. Mother states she is no longer planning ot breastfeed .Risks/benefits discussed and pt requesting steroids for tonsillitis. Will give medrol dosepak. Advised  risks/benefits including decreased milk production, adreal suppression in newborn. Mother in agreement. Will d/c home.  Clinical Impression: 1. Pharyngitis     Disposition: Discharge  Condition: Good  I have discussed the results, Dx and Tx plan with the pt(& family if present). He/she/they expressed understanding and agree(s) with the plan. Discharge instructions discussed at great length. Strict return precautions discussed and pt &/or family have verbalized understanding of the instructions. No further questions at time of discharge.    Discharge Medication List as of 12/23/2015 11:36 AM    START taking these medications   Details  amoxicillin-clavulanate (AUGMENTIN) 875-125 MG tablet Take 1 tablet by mouth every 12 (twelve) hours., Starting 12/23/2015, Until Discontinued, Print    methylPREDNISolone (MEDROL DOSEPAK) 4 MG TBPK tablet As per package, Print        [facility]: [facility] [address] [place] [id number] [phone]  Follow-up in 3-5 days as needed. If you do not need a doctor, you can call this number to set up an appointment.    Shaune Pollackameron Teaghan Melrose, MD 12/23/15 1932  Shaune Pollackameron Sentoria Brent, MD 12/23/15 [phone]

## 2015-12-23 NOTE — ED Notes (Signed)
MD at bedside. 

## 2015-12-24 LAB — CYTOLOGY, (ORAL, ANAL, URETHRAL) ANCILLARY ONLY
CHLAMYDIA, DNA PROBE: NEGATIVE
Neisseria Gonorrhea: NEGATIVE

## 2016-04-06 ENCOUNTER — Other Ambulatory Visit (HOSPITAL_COMMUNITY): Payer: Self-pay | Admitting: Obstetrics and Gynecology

## 2016-04-06 DIAGNOSIS — R52 Pain, unspecified: Secondary | ICD-10-CM

## 2016-04-07 ENCOUNTER — Ambulatory Visit (HOSPITAL_COMMUNITY): Payer: Medicaid Other

## 2016-04-08 ENCOUNTER — Ambulatory Visit (HOSPITAL_COMMUNITY)
Admission: RE | Admit: 2016-04-08 | Discharge: 2016-04-08 | Disposition: A | Discharge status: Home / Self Care | Payer: Medicaid Other | Source: Ambulatory Visit | Attending: Obstetrics and Gynecology | Admitting: Obstetrics and Gynecology

## 2016-04-08 DIAGNOSIS — Z862 Personal history of diseases of the blood and blood-forming organs and certain disorders involving the immune mechanism: Secondary | ICD-10-CM | POA: Diagnosis not present

## 2016-04-08 DIAGNOSIS — R52 Pain, unspecified: Secondary | ICD-10-CM | POA: Diagnosis not present

## 2016-04-08 DIAGNOSIS — I651 Occlusion and stenosis of basilar artery: Secondary | ICD-10-CM | POA: Diagnosis not present

## 2016-04-08 NOTE — Progress Notes (Signed)
VASCULAR LAB PRELIMINARY  PRELIMINARY  PRELIMINARY  PRELIMINARY  Bilateral upper extremity venous duplex completed.    Preliminary report:  Right - No evidence of DVT or superficial thrombosis. Left - No evidence of DVT. There is residue of the previous superficial thrombus in the basilic vein  Erin Boyd, RVS 04/08/2016, 3:14 PM

## 2016-05-10 ENCOUNTER — Encounter (HOSPITAL_BASED_OUTPATIENT_CLINIC_OR_DEPARTMENT_OTHER): Payer: Self-pay | Admitting: *Deleted

## 2016-05-10 ENCOUNTER — Emergency Department (HOSPITAL_BASED_OUTPATIENT_CLINIC_OR_DEPARTMENT_OTHER)
Admission: EM | Admit: 2016-05-10 | Discharge: 2016-05-10 | Disposition: A | Discharge status: Home / Self Care | Payer: Medicaid Other | Attending: Emergency Medicine | Admitting: Emergency Medicine

## 2016-05-10 DIAGNOSIS — J020 Streptococcal pharyngitis: Secondary | ICD-10-CM | POA: Insufficient documentation

## 2016-05-10 DIAGNOSIS — J029 Acute pharyngitis, unspecified: Secondary | ICD-10-CM | POA: Diagnosis present

## 2016-05-10 DIAGNOSIS — Z87891 Personal history of nicotine dependence: Secondary | ICD-10-CM | POA: Insufficient documentation

## 2016-05-10 LAB — RAPID STREP SCREEN (MED CTR MEBANE ONLY): Streptococcus, Group A Screen (Direct): POSITIVE — AB

## 2016-05-10 MED ORDER — IBUPROFEN 800 MG PO TABS: 800.0000 mg | ORAL_TABLET | Freq: Three times a day (TID) | ORAL | 0 refills | Status: DC

## 2016-05-10 MED ORDER — HYDROCODONE-ACETAMINOPHEN 5-325 MG PO TABS: 1.0000 | ORAL_TABLET | ORAL | 0 refills | Status: DC | PRN

## 2016-05-10 MED ORDER — DEXAMETHASONE SODIUM PHOSPHATE 10 MG/ML IJ SOLN
10.0000 mg | Freq: Once | INTRAMUSCULAR | Status: AC
  Administered 2016-05-10: 10 mg via INTRAMUSCULAR
  Filled 2016-05-10: qty 1

## 2016-05-10 MED ORDER — ACETAMINOPHEN 500 MG PO TABS
1000.0000 mg | ORAL_TABLET | Freq: Once | ORAL | Status: AC
  Administered 2016-05-10: 1000 mg via ORAL

## 2016-05-10 MED ORDER — PREDNISONE 10 MG (21) PO TBPK: 10.0000 mg | ORAL_TABLET | Freq: Every day | ORAL | 0 refills | Status: DC

## 2016-05-10 MED ORDER — ACETAMINOPHEN 500 MG PO TABS
ORAL_TABLET | ORAL | Status: AC
  Filled 2016-05-10: qty 2

## 2016-05-10 MED ORDER — AMOXICILLIN-POT CLAVULANATE 875-125 MG PO TABS: 1.0000 | ORAL_TABLET | Freq: Two times a day (BID) | ORAL | 0 refills | Status: DC

## 2016-05-10 MED ORDER — IBUPROFEN 800 MG PO TABS
800.0000 mg | ORAL_TABLET | Freq: Once | ORAL | Status: AC
  Administered 2016-05-10: 800 mg via ORAL
  Filled 2016-05-10: qty 1

## 2016-05-10 NOTE — ED Notes (Signed)
ED Provider at bedside. 

## 2016-05-10 NOTE — ED Provider Notes (Signed)
[facility] Provider Note   CSN: [id number] Arrival date & time: 05/10/16  4098     History   Chief Complaint Chief Complaint  Patient presents with  . Sore Throat    HPI Erin Boyd is a 30 y.o. female.  Pt presents to the ED today with fever and sore throat.  No known exposures to strep.  She took 400 mg of ibuprofen this morning around 0300.        Past Medical History:  Diagnosis Date  . Cervical abnormality affecting pregnancy, antepartum 08/25/2015  . DVT (deep vein thrombosis) in pregnancy [facility])    dx clot in right arm in Jan 2017.  Been off Lovenox since Friday 08/23/15   . Headache    otc med prn  . Heartburn in pregnancy   . No pertinent past medical history   . Short cervix with cervical cerclage in second trimester, antepartum 09/06/2015    Patient Active Problem List   Diagnosis Date Noted  . Breech birth 11/06/2015  . Preterm premature rupture of membranes (PPROM) with unknown onset of labor 10/09/2015  . Short cervix with cervical cerclage in second trimester, antepartum 09/06/2015  . Cervical abnormality affecting pregnancy, antepartum 08/25/2015  . DVT of upper extremity (deep vein thrombosis) (HCC) 07/06/2015  . UTI (urinary tract infection) 12/12/2013  . Drug overdose 12/11/2013  . Metabolic acidosis 12/11/2013  . Wolff-Parkinson-White (WPW) pattern 12/11/2013    Past Surgical History:  Procedure Laterality Date  . CERVICAL CERCLAGE N/A 09/06/2015   Procedure: CERCLAGE CERVICAL;  Surgeon: Sherian Rein, MD;  Location: [facility] ORS;  Service: Gynecology;  Laterality: N/A;  . CESAREAN SECTION N/A 11/06/2015   Procedure: CESAREAN SECTION;  Surgeon: Huel Cote, MD;  Location: [facility] BIRTHING SUITES;  Service: Obstetrics;  Laterality: N/A;  . INDUCED ABORTION    . pediatric dental surgery    . WISDOM TOOTH EXTRACTION      OB History    Gravida Para Term Preterm AB Living   6 2 1 1 3 1    SAB TAB Ectopic Multiple Live  Births   1 2 0 0 1       Home Medications    Prior to Admission medications   Medication Sig Start Date End Date Taking? Authorizing Provider  amoxicillin-clavulanate (AUGMENTIN) 875-125 MG tablet Take 1 tablet by mouth every 12 (twelve) hours. 05/10/16   Jacalyn Lefevre, MD  calcium carbonate (TUMS EX) 750 MG chewable tablet Chew 2 tablets by mouth as needed for heartburn.    Historical Provider, MD  HYDROcodone-acetaminophen (NORCO/VICODIN) 5-325 MG tablet Take 1 tablet by mouth every 4 (four) hours as needed. 05/10/16   Jacalyn Lefevre, MD  ibuprofen (ADVIL,MOTRIN) 800 MG tablet Take 1 tablet (800 mg total) by mouth 3 (three) times daily. 05/10/16   Jacalyn Lefevre, MD  predniSONE (STERAPRED UNI-PAK 21 TAB) 10 MG (21) TBPK tablet Take 1 tablet (10 mg total) by mouth daily. Take 6 tabs by mouth daily  for 2 days, then 5 tabs for 2 days, then 4 tabs for 2 days, then 3 tabs for 2 days, 2 tabs for 2 days, then 1 tab by mouth daily for 2 days 05/10/16   Jacalyn Lefevre, MD  Prenatal Vit-Fe Fumarate-FA (PRENATAL MULTIVITAMIN) TABS tablet Take 1 tablet by mouth daily.     Historical Provider, MD    Family History Family History  Problem Relation Age of Onset  . Clotting disorder Neg Hx     Social History Social History  Substance Use Topics  . Smoking status: Former Smoker    Packs/day: 0.50    Years: 7.00    Types: Cigarettes    Quit date: 06/08/2014  . Smokeless tobacco: Never Used  . Alcohol use 0.0 oz/week     Comment: occasional wine - none with pregnancy     Allergies   Latex   Review of Systems Review of Systems  Constitutional: Positive for chills and fever.  HENT: Positive for sore throat.   All other systems reviewed and are negative.    Physical Exam Updated Vital Signs BP 119/86 (BP Location: Left Arm)   Pulse 117   Temp 102.5 F (39.2 C)   Resp 18   Ht 5\' 9"  (1.753 m)   Wt 233 lb (105.7 kg)   LMP 05/05/2016   SpO2 100%   BMI 34.41 kg/m   Physical Exam    Constitutional: She is oriented to person, place, and time. She appears well-developed and well-nourished.  HENT:  Head: Normocephalic and atraumatic.  Right Ear: External ear normal.  Left Ear: External ear normal.  Nose: Nose normal.  Mouth/Throat: Mucous membranes are dry. Oropharyngeal exudate and posterior oropharyngeal erythema present.  Eyes: Conjunctivae and EOM are normal. Pupils are equal, round, and reactive to light.  Neck: Normal range of motion. Neck supple.  Cardiovascular: Regular rhythm, normal heart sounds and intact distal pulses.  Tachycardia present.   Pulmonary/Chest: Effort normal and breath sounds normal.  Abdominal: Soft. Bowel sounds are normal.  Musculoskeletal: Normal range of motion.  Neurological: She is alert and oriented to person, place, and time.  Nursing note and vitals reviewed.    ED Treatments / Results  Labs (all labs ordered are listed, but only abnormal results are displayed) Labs Reviewed  RAPID STREP SCREEN (NOT AT [facility]) - Abnormal; Notable for the following:       Result Value   Streptococcus, Group A Screen (Direct) POSITIVE (*)    All other components within normal limits    EKG  EKG Interpretation None       Radiology No results found.  Procedures Procedures (including critical care time)  Medications Ordered in ED Medications  acetaminophen (TYLENOL) tablet 1,000 mg (1,000 mg Oral Given 05/10/16 0952)  dexamethasone (DECADRON) injection 10 mg (10 mg Intramuscular Given 05/10/16 1013)  ibuprofen (ADVIL,MOTRIN) tablet 800 mg (800 mg Oral Given 05/10/16 1013)     Initial Impression / Assessment and Plan / ED Course  I have reviewed the triage vital signs and the nursing notes.  Pertinent labs & imaging results that were available during my care of the patient were reviewed by me and considered in my medical decision making (see chart for details).  Clinical Course    Pt is able to tolerate po fluids.  She was here 3  times in July for strep, and the last antibiotic she was on (augmentin) finally made her feel better.  She requests that again.  She said the "shot" did not help (bicillin la).  The pt has a hx of recurrent strep infections and requests ENT referral.  I gave her the number for ENT to call.  She knows to return if worse.   Final Clinical Impressions(s) / ED Diagnoses   Final diagnoses:  Strep pharyngitis    New Prescriptions New Prescriptions   AMOXICILLIN-CLAVULANATE (AUGMENTIN) 875-125 MG TABLET    Take 1 tablet by mouth every 12 (twelve) hours.   HYDROCODONE-ACETAMINOPHEN (NORCO/VICODIN) 5-325 MG TABLET    Take 1  tablet by mouth every 4 (four) hours as needed.   IBUPROFEN (ADVIL,MOTRIN) 800 MG TABLET    Take 1 tablet (800 mg total) by mouth 3 (three) times daily.   PREDNISONE (STERAPRED UNI-PAK 21 TAB) 10 MG (21) TBPK TABLET    Take 1 tablet (10 mg total) by mouth daily. Take 6 tabs by mouth daily  for 2 days, then 5 tabs for 2 days, then 4 tabs for 2 days, then 3 tabs for 2 days, 2 tabs for 2 days, then 1 tab by mouth daily for 2 days     Jacalyn LefevreJulie Harlee Pursifull, MD 05/10/16 1030

## 2016-05-10 NOTE — ED Triage Notes (Signed)
Pt c/o sore throat x 2 days with fever and body aches

## 2016-06-03 ENCOUNTER — Emergency Department (HOSPITAL_BASED_OUTPATIENT_CLINIC_OR_DEPARTMENT_OTHER)
Admission: EM | Admit: 2016-06-03 | Discharge: 2016-06-03 | Disposition: A | Discharge status: Home / Self Care | Payer: Medicaid Other | Attending: Emergency Medicine | Admitting: Emergency Medicine

## 2016-06-03 ENCOUNTER — Encounter (HOSPITAL_BASED_OUTPATIENT_CLINIC_OR_DEPARTMENT_OTHER): Payer: Self-pay | Admitting: Emergency Medicine

## 2016-06-03 DIAGNOSIS — Z79899 Other long term (current) drug therapy: Secondary | ICD-10-CM | POA: Diagnosis not present

## 2016-06-03 DIAGNOSIS — Z87891 Personal history of nicotine dependence: Secondary | ICD-10-CM | POA: Diagnosis not present

## 2016-06-03 DIAGNOSIS — N898 Other specified noninflammatory disorders of vagina: Secondary | ICD-10-CM | POA: Diagnosis not present

## 2016-06-03 DIAGNOSIS — R300 Dysuria: Secondary | ICD-10-CM | POA: Insufficient documentation

## 2016-06-03 DIAGNOSIS — R102 Pelvic and perineal pain: Secondary | ICD-10-CM

## 2016-06-03 LAB — URINALYSIS, ROUTINE W REFLEX MICROSCOPIC
Bilirubin Urine: NEGATIVE
GLUCOSE, UA: NEGATIVE mg/dL
HGB URINE DIPSTICK: NEGATIVE
Ketones, ur: NEGATIVE mg/dL
Leukocytes, UA: NEGATIVE
Nitrite: NEGATIVE
PH: 5 (ref 5.0–8.0)
Protein, ur: NEGATIVE mg/dL
SPECIFIC GRAVITY, URINE: 1.024 (ref 1.005–1.030)

## 2016-06-03 LAB — WET PREP, GENITAL
CLUE CELLS WET PREP: NONE SEEN
SPERM: NONE SEEN
Trich, Wet Prep: NONE SEEN

## 2016-06-03 LAB — PREGNANCY, URINE: Preg Test, Ur: NEGATIVE

## 2016-06-03 NOTE — ED Provider Notes (Signed)
[facility]   CSN: [id number] Arrival date & time: 06/03/16  1035     History   Chief Complaint Chief Complaint  Patient presents with  . Abdominal Pain    HPI Erin Boyd is a 30 y.o. female who presents with right lower pelvic pain and vaginal discharge. PMH significant for recent pregnancy. She delivered . Currently has IUD. She reports acute onset of right lower pelvic pain since yesterday. It is constant and worse with urination and palpation. She endorses associated urinary urgency and cloudy urine although she states when she gave a urine sample today it looked normal. She also reports some vaginal discharge which is different from her normal discharge however she has not had intercourse in about 2 months. She denies fever, chills, upper abdominal pain, N/V/D.  HPI  Past Medical History:  Diagnosis Date  . Cervical abnormality affecting pregnancy, antepartum 08/25/2015  . DVT (deep vein thrombosis) in pregnancy [facility])    dx clot in right arm in Jan 2017.  Been off Lovenox since Friday 08/23/15   . Headache    otc med prn  . Heartburn in pregnancy   . No pertinent past medical history   . Short cervix with cervical cerclage in second trimester, antepartum 09/06/2015    Patient Active Problem List   Diagnosis Date Noted  . Breech birth 11/06/2015  . Preterm premature rupture of membranes (PPROM) with unknown onset of labor 10/09/2015  . Short cervix with cervical cerclage in second trimester, antepartum 09/06/2015  . Cervical abnormality affecting pregnancy, antepartum 08/25/2015  . DVT of upper extremity (deep vein thrombosis) (HCC) 07/06/2015  . UTI (urinary tract infection) 12/12/2013  . Drug overdose 12/11/2013  . Metabolic acidosis 12/11/2013  . Wolff-Parkinson-White (WPW) pattern 12/11/2013    Past Surgical History:  Procedure Laterality Date  . CERVICAL CERCLAGE N/A 09/06/2015   Procedure: CERCLAGE CERVICAL;  Surgeon: Sherian Rein, MD;  Location: [facility] ORS;  Service: Gynecology;  Laterality: N/A;  . CESAREAN SECTION N/A 11/06/2015   Procedure: CESAREAN SECTION;  Surgeon: Huel Cote, MD;  Location: [facility] BIRTHING SUITES;  Service: Obstetrics;  Laterality: N/A;  . INDUCED ABORTION    . pediatric dental surgery    . WISDOM TOOTH EXTRACTION      OB History    Gravida Para Term Preterm AB Living   6 2 1 1 3 1    SAB TAB Ectopic Multiple Live Births   1 2 0 0 1       Home Medications    Prior to Admission medications   Medication Sig Start Date End Date Taking? Authorizing Provider  Prenatal Vit-Fe Fumarate-FA (PRENATAL MULTIVITAMIN) TABS tablet Take 1 tablet by mouth daily.     Historical Provider, MD    Family History Family History  Problem Relation Age of Onset  . Clotting disorder Neg Hx     Social History Social History  Substance Use Topics  . Smoking status: Former Smoker    Packs/day: 0.50    Years: 7.00    Types: Cigarettes    Quit date: 06/08/2014  . Smokeless tobacco: Never Used  . Alcohol use 0.0 oz/week     Comment: occasional wine - none with pregnancy     Allergies   Latex   Review of Systems Review of Systems  Constitutional: Negative for chills and fever.  Gastrointestinal: Positive for abdominal pain. Negative for diarrhea, nausea and vomiting.  Genitourinary: Positive for dysuria, pelvic pain, urgency and vaginal discharge.  Negative for flank pain, hematuria, menstrual problem and vaginal bleeding.  All other systems reviewed and are negative.    Physical Exam Updated Vital Signs BP 110/77   Pulse 98   Temp 98.5 F (36.9 C) (Oral)   Resp 16   Ht 5\' 10"  (1.778 m)   Wt 108.9 kg   LMP 05/12/2016   SpO2 99%   BMI 34.44 kg/m   Physical Exam  Constitutional: She is oriented to person, place, and time. She appears well-developed and well-nourished. No distress.  HENT:  Head: Normocephalic and atraumatic.  Eyes: Conjunctivae are normal. Pupils are  equal, round, and reactive to light. Right eye exhibits no discharge. Left eye exhibits no discharge. No scleral icterus.  Neck: Normal range of motion.  Cardiovascular: Normal rate and regular rhythm.  Exam reveals no gallop and no friction rub.   No murmur heard. Pulmonary/Chest: Effort normal and breath sounds normal. No respiratory distress. She has no wheezes. She has no rales. She exhibits no tenderness.  Abdominal: Soft. Bowel sounds are normal. She exhibits no distension and no mass. There is tenderness (Mild right lower pelvic tenderness). There is no rebound and no guarding. No hernia.  Genitourinary:  Genitourinary Comments: No inguinal lymphadenopathy or inguinal hernia noted. Normal external genitalia. No pain with speculum insertion. Closed cervical os with normal appearance - no rash or lesions. There is mixed yellow and white discharge in the vaginal vault. On bimanual examination no adnexal tenderness or cervical motion tenderness. Chaperone present during exam.    Neurological: She is alert and oriented to person, place, and time.  Skin: Skin is warm and dry.  Psychiatric: She has a normal mood and affect. Her behavior is normal.  Nursing note and vitals reviewed.    ED Treatments / Results  Labs (all labs ordered are listed, but only abnormal results are displayed) Labs Reviewed  WET PREP, GENITAL - Abnormal; Notable for the following:       Result Value   Yeast Wet Prep HPF POC PRESENT (*)    WBC, Wet Prep HPF POC MANY (*)    All other components within normal limits  URINALYSIS, ROUTINE W REFLEX MICROSCOPIC - Abnormal; Notable for the following:    APPearance CLOUDY (*)    All other components within normal limits  PREGNANCY, URINE  GC/CHLAMYDIA PROBE AMP ([place]) NOT AT [facility]    EKG  EKG Interpretation None       Radiology No results found.  Procedures Procedures (including critical care time)  Medications Ordered in ED Medications - No data to  display   Initial Impression / Assessment and Plan / ED Course  I have reviewed the triage vital signs and the nursing notes.  Pertinent labs & imaging results that were available during my care of the patient were reviewed by me and considered in my medical decision making (see chart for details).  Clinical Course    30 year old female presents with right lower pelvic pain. Unclear etiology. Patient is afebrile, not tachycardic or tachypneic, normotensive, and not hypoxic. She is non-toxic appearing. UA is clean.   Attempted to get CBC and CMP however patient informed me that she has to leave to take care of personal matters. Her pain is mild and I advised her she will be able to see her results on MyChart. Do not feel she needs to sign out AMA. Discussed strict return precautions. She left before the Wet Prep was resulted which shows yeast and many  WBC. G&C sent. Attempted to call patient to notify her of results however call went to voicemail. No voicemail left.   Final Clinical Impressions(s) / ED Diagnoses   Final diagnoses:  Pelvic pain in female    New Prescriptions Discharge Medication List as of 06/03/2016 12:02 PM       Bethel BornKelly Marie Justinn Welter, PA-C 06/04/16 1020    Jerelyn ScottMartha Linker, MD 06/06/16 [phone]

## 2016-06-03 NOTE — Discharge Instructions (Signed)
Please return to the ED for worsening symptoms

## 2016-06-03 NOTE — ED Triage Notes (Signed)
Pt having right lower pelvic pain since last night.  Some cloudy urine and urgency.  Some yellow vaginal discharge.

## 2016-06-04 LAB — GC/CHLAMYDIA PROBE AMP (~~LOC~~) NOT AT ARMC
Chlamydia: NEGATIVE
Neisseria Gonorrhea: NEGATIVE

## 2017-12-23 ENCOUNTER — Telehealth: Payer: Self-pay

## 2017-12-23 ENCOUNTER — Telehealth: Payer: Self-pay | Admitting: Hematology

## 2017-12-23 NOTE — Telephone Encounter (Signed)
Print calender of upcoming appointment. Per Maceo ProKale okayed to see patient tomorrow due to she is established. Patient spoke with Belenda CruiseKristin on 7/17 and was told to bring her referral how ever Sue Lushndrea said that she didn't need a new one because she was est. Patient already. Per 7/18 walk in

## 2017-12-23 NOTE — Telephone Encounter (Signed)
Tried to reach regarding voicemail °

## 2017-12-23 NOTE — Progress Notes (Signed)
Marland Kitchen.    HEMATOLOGY/ONCOLOGY CONSULTATION NOTE  Date of Service:  12/24/17    Patient Care Team: Patient, No Pcp Per as PCP - General (General Practice)  CHIEF COMPLAINTS/PURPOSE OF CONSULTATION:  LUE basilic DVT during pregnancy  HISTORY OF PRESENTING ILLNESS:   Erin Boyd is a wonderful 32 y.o. female who has been referred to us by Dr Augusto GambleJody Carlean JewsBovard St Uckert MD for evaluation and management of newly diagnosed LUE DVT during pregnancy.  Patient is left-handed and presented with left arm soreness around 26 June 2015 while she was about [redacted] weeks pregnant. She underwent an ultrasound which showed an occlusive deep venous thrombosis within the left basilic vein extending from the distal upper arm to the proximal forearm, 2-3 cm from its junction with the brachial vein. Patient reports no previous history of venous thromboembolism.  No family history of venous thromboembolism. Denies having any IV lines in the left arm.  Has had a pregnancy 8 years ago without any VTE.  She has used hormonal contraception for about 5028yrs in the past without any complications.  She notes that she has had one abortion and one miscarriage in this last year prior to her current pregnancy.  She notes that she had started oral contraceptive pills in September 2016 and despite that became pregnant soon after that.   Her GYN doctor Dr. Ellyn HackBovard started on Lovenox 100 mg twice a day for her new DVT.  She notes that her left arm soreness has since resolved. She had a thrombophilia workup that showed a negative antiphospholipid antibody screen including beta-2 glycoprotein, anticardiolipin antibody and lupus anticoagulant. Factor IV Leiden mutation negative.  Prothrombin gene mutation not done.  Antithrombin III within normal limits.  Normal hemoglobin electrophoresis.  Other acquired risk factors: Patient was a former smokerhalf a pack per day.  Quit 2 years ago.  Obesity BMI nearly 34.  Was on oral contraceptive  pills when she got pregnant.  Is currently [redacted] weeks pregnant.  No issues with bleeding on the Lovenox.  She has never had issues with VTE in the past.  Interval History:   Erin Boyd returns today for management and evaluation of her Iron-deficiency anemia. The patient's last visit with us was on 07/25/15. The pt reports that she is doing well overall. Verbal consent has been given by the pt for a clinical observer, Berkley DesanctisAnushka Kaiwar, to be present.   The pt is anticipating a tummy tuck surgery and breast surgery, next week and has been told that her Hgb is too low for surgery. She will be having surgery with Dr. Lorayne BenderJuliana Navia in [place] on 12/31/17 and will return to the country after two weeks.  She notes that she had labs on 12/21/17 which revealed a HGB of 11.5, and her PCP is Dr. Everlene OtherBouska. She took Ferrous sulfate BID between her delivery in May 2017 and January 2019 and began taking Ferrous sulfate once a day from January until now. She denies any problems tolerating her PO iron replacement. She notes that she has not had IV iron before, and denies any history of asthma.   The pt reports that she has not had any issues with blood clots since her last visit. She notes that she has been evaluated with US a few times since then and has not had any concerns.   The pt also notes that her periods have been very heavy in the interim. She notes that her periods are heavy for 4 days.  Lab results (12/21/17) of CBC w/diff, CMP revealed HGB at 11.5, MCV at 77.3, and normal chemistries. .   On review of systems, pt reports heavy periods, good energy levels and denies abdominal pains, arm swelling, leg swelling, light headedness, dizziness, and any other symptoms.   MEDICAL HISTORY:  Past Medical History:  Diagnosis Date  . Cervical abnormality affecting pregnancy, antepartum 08/25/2015  . DVT (deep vein thrombosis) in pregnancy [facility])    dx clot in right arm in Jan 2017.  Been off Lovenox  since Friday 08/23/15   . Headache    otc med prn  . Heartburn in pregnancy   . No pertinent past medical history   . Short cervix with cervical cerclage in second trimester, antepartum 09/06/2015   Obesity .Body mass index is 33.37 kg/m.   SURGICAL HISTORY: Past Surgical History:  Procedure Laterality Date  . CERVICAL CERCLAGE N/A 09/06/2015   Procedure: CERCLAGE CERVICAL;  Surgeon: Sherian Rein, MD;  Location: [facility];  Service: Gynecology;  Laterality: N/A;  . CESAREAN SECTION N/A 11/06/2015   Procedure: CESAREAN SECTION;  Surgeon: Huel Cote, MD;  Location: [facility];  Service: Obstetrics;  Laterality: N/A;  . INDUCED ABORTION    . pediatric dental surgery    . WISDOM TOOTH EXTRACTION      SOCIAL HISTORY: Social History   Socioeconomic History  . Marital status: Single    Spouse name: Not on file  . Number of children: Not on file  . Years of education: Not on file  . Highest education level: Not on file  Occupational History  . Not on file  Social Needs  . Financial resource strain: Not on file  . Food insecurity:    Worry: Not on file    Inability: Not on file  . Transportation needs:    Medical: Not on file    Non-medical: Not on file  Tobacco Use  . Smoking status: Former Smoker    Packs/day: 0.50    Years: 7.00    Pack years: 3.50    Types: Cigarettes    Last attempt to quit: 06/08/2014    Years since quitting: 3.5  . Smokeless tobacco: Never Used  Substance and Sexual Activity  . Alcohol use: Yes    Comment: occasional wine - none with pregnancy  . Drug use: No  . Sexual activity: Never    Birth control/protection: IUD    Comment: approx [redacted] wks gestation   Lifestyle  . Physical activity:    Days per week: Not on file    Minutes per session: Not on file  . Stress: Not on file  Relationships  . Social connections:    Talks on phone: Not on file    Gets together: Not on file    Attends religious service: Not on file    Active  member of club or organization: Not on file    Attends meetings of clubs or organizations: Not on file    Relationship status: Not on file  . Intimate partner violence:    Fear of current or ex partner: Not on file    Emotionally abused: Not on file    Physically abused: Not on file    Forced sexual activity: Not on file  Other Topics Concern  . Not on file  Social History Narrative  . Not on file    FAMILY HISTORY: Family History  Problem Relation Age of Onset  . Clotting disorder Neg Hx     ALLERGIES:  is allergic to latex.  MEDICATIONS:  Current Outpatient Medications  Medication Sig Dispense Refill  . ferrous sulfate 325 (65 FE) MG tablet Take 325 mg by mouth 2 (two) times daily with a meal.     No current facility-administered medications for this visit.     REVIEW OF SYSTEMS:    A 10+ POINT REVIEW OF SYSTEMS WAS OBTAINED including neurology, dermatology, psychiatry, cardiac, respiratory, lymph, extremities, GI, GU, Musculoskeletal, constitutional, breasts, reproductive, HEENT.  All pertinent positives are noted in the HPI.  All others are negative.   PHYSICAL EXAMINATION: ECOG PERFORMANCE STATUS: 1 - Symptomatic but completely ambulatory  . Vitals:   12/24/17 0840  BP: (!) 112/96  Pulse: 80  Resp: 18  Temp: 97.9 F (36.6 C)  SpO2: 100%   Filed Weights   12/24/17 0840  Weight: 232 lb 9.6 oz (105.5 kg)   .Body mass index is 33.37 kg/m.  GENERAL:alert, in no acute distress and comfortable SKIN: no acute rashes, no significant lesions EYES: conjunctiva are pink and non-injected, sclera anicteric OROPHARYNX: MMM, no exudates, no oropharyngeal erythema or ulceration NECK: supple, no JVD LYMPH:  no palpable lymphadenopathy in the cervical, axillary or inguinal regions LUNGS: clear to auscultation b/l with normal respiratory effort HEART: regular rate & rhythm ABDOMEN:  normoactive bowel sounds , non tender, not distended. No palpable hepatosplenomegaly.    Extremity: no pedal edema PSYCH: alert & oriented x 3 with fluent speech NEURO: no focal motor/sensory deficits   LABORATORY DATA:  I have reviewed the data as listed  . CBC Latest Ref Rng & Units 12/24/2017 11/07/2015 11/06/2015  WBC 3.9 - 10.3 K/uL 4.7 20.3(H) 31.1(H)  Hemoglobin 11.6 - 15.9 g/dL 60.4 5.4(U) 10.0(L)  Hematocrit 34.8 - 46.6 % 36.8 25.3(L) 28.9(L)  Platelets 145 - 400 K/uL 212 171 169   . CBC    Component Value Date/Time   WBC 4.7 12/24/2017 0933   RBC 4.66 12/24/2017 0933   RBC 4.66 12/24/2017 0933   HGB 11.7 12/24/2017 0933   HCT 36.8 12/24/2017 0933   PLT 212 12/24/2017 0933   MCV 79.0 (L) 12/24/2017 0933   MCH 25.1 12/24/2017 0933   MCHC 31.8 12/24/2017 0933   RDW 17.8 (H) 12/24/2017 0933   LYMPHSABS 2.0 12/24/2017 0933   MONOABS 0.4 12/24/2017 0933   EOSABS 0.1 12/24/2017 0933   BASOSABS 0.0 12/24/2017 0933    . CMP Latest Ref Rng & Units 06/26/2015 04/17/2014 04/16/2014  Glucose 65 - 99 mg/dL 92 981(X) 914(N)  BUN 6 - 20 mg/dL 10 9 8   Creatinine 0.44 - 1.00 mg/dL 8.29 5.62 1.30  Sodium 135 - 145 mmol/L 134(L) 139 138  Potassium 3.5 - 5.1 mmol/L 3.5 3.6(L) 3.7  Chloride 101 - 111 mmol/L 105 102 101  CO2 22 - 32 mmol/L 21(L) 21 21  Calcium 8.9 - 10.3 mg/dL 8.6(V) 9.5 9.6  Total Protein 6.0 - 8.3 g/dL - - 7.8  Total Bilirubin 0.3 - 1.2 mg/dL - - 0.3  Alkaline Phos 39 - 117 U/L - - 52  AST 0 - 37 U/L - - 24  ALT 0 - 35 U/L - - 20   . Lab Results  Component Value Date   IRON 27 (L) 12/24/2017   TIBC 461 (H) 12/24/2017   IRONPCTSAT 6 (L) 12/24/2017   (Iron and TIBC)  Lab Results  Component Value Date   FERRITIN 9 (L) 12/24/2017     RADIOGRAPHIC STUDIES: I have personally reviewed the radiological images as  listed and agreed with the findings in the report. No results found.  [place] unilateral left UE 06/26/2015;   IMPRESSION: Occlusive deep venous thrombosis within the left basilic vein. This extends from the distal upper arm to the  proximal forearm, 2-3 cm from its junction with the brachial vein.  These results were called by telephone at the time of interpretation on 06/26/2015 at 9:45 pm to Dr. Glynn Octave, who verbally acknowledged these results.   Electronically Signed  By: Roanna Raider M.D.  On: 06/26/2015 21:45   ASSESSMENT & PLAN:   32 y.o. AAF with   1) LUE basilic DVT diagnosed on [place] on 06/26/2015. Obvious risk factor include pregnancy, recent OCP exposure, obesity. She has no Fhx of VTE and has not previously had VTE despite having hormonal trigger events including pregnancy, miscarriage and hormonal contraception.  She has been on therapeutic lovenox with resolution of symptoms of left arm pain. Her partial hypercoagulopathy workup was neg for Factor V leiden mutation, nl ATIII, neg for APL antibodies. Prothrombin antibodies were done (presumably instead of a plan to get prothrombin gene mutation study)  PLAN: -overall clinical picture is not overtly concerning for a hereditary thrombophilia. -would consider this hormonally triggered in the setting of pregnancy or perhaps from a local vascular risk factor as such venous kinking (denies blood draws or IV line in that area) -patient is left handed. -do not recommend more extensive hypercoagulability workup at this time. Might consider prothrombin gene mutation testing with during her next antenatal lab draws. She did not want labs drawn today in clinic. -would treat with therapeutic lovenox for a total of 3 months and then switch to prophylactic lovenox till 6 weeks post-partum in the absence of any bleeding issues. -would recommend against hormonal contraception in the future. If patient wants to choose one Mirena would porbably be the safest option. -might need discussion of the pros vs cons of prophylactic anticoagulation with future pregnancies. -All these considerations were discussed in details with the patient and she is agreeable to this  plan. -we also discuss VTE risk reduction strategies including working on healthy weight loss post pregnancy, maintaining physical activity level, absolute smoking cessation, avoiding dehydration, avoid significant immobility etc.   2. Iron deficiency anemia -Discussed pt labwork today, 12/21/17 reviewed . Lab Results  Component Value Date   IRON 27 (L) 12/24/2017   TIBC 461 (H) 12/24/2017   IRONPCTSAT 6 (L) 12/24/2017   (Iron and TIBC)  Lab Results  Component Value Date   FERRITIN 9 (L) 12/24/2017    -Discussed that for all practical purposes, her HGB should not be a limiting factor for her surgery, with a HGB of 11.5 -Chemistries are normal, MCV at 77.3 on 12/21/17 labs -given her severe iron deficiency and her informed choice for aggressive Iron replacement prior to upcoming plastic surgery will infuse pt with IV Injectafer, before surgery -Discussed the possibility of allergic reactions to IV Iron infusion -Discussed the recommendation for appropriate blood thinners around surgery, given previous recent blood clots    Labs today IV Injectafer x 1 ASAP (patient due for surgery on 12/30/2017) needs prior to this. RTC with Dr Candise Che in 3 months with labs  All of the patients questions were answered with apparent satisfaction. The patient knows to call the clinic with any problems, questions or concerns.  The total time spent in the appt was 25 minutes and more than 50% was on counseling and direct patient cares.     Verbal consent  has been given by the pt for a clinical observer,  Desanctis, to be present.   Wyvonnia Lora MD MS AAHIVMS [facility] Hematology/Oncology Physician [facility]  (Office):       [phone] (Work cell):  [phone] (Fax):           [phone]  I, Marcelline Mates, am acting as a scribe for Dr Candise Che.   .I have reviewed the above documentation for accuracy and completeness, and I agree with the above. Johney Maine MD

## 2017-12-24 ENCOUNTER — Inpatient Hospital Stay: Payer: Medicaid Other | Attending: Hematology | Admitting: Hematology

## 2017-12-24 ENCOUNTER — Telehealth: Payer: Self-pay

## 2017-12-24 ENCOUNTER — Inpatient Hospital Stay: Payer: Medicaid Other

## 2017-12-24 ENCOUNTER — Encounter: Payer: Self-pay | Admitting: Hematology

## 2017-12-24 VITALS — BP 112/96 | HR 80 | Temp 97.9°F | Resp 18 | Ht 70.0 in | Wt 232.6 lb

## 2017-12-24 DIAGNOSIS — Z86718 Personal history of other venous thrombosis and embolism: Secondary | ICD-10-CM | POA: Diagnosis not present

## 2017-12-24 DIAGNOSIS — D509 Iron deficiency anemia, unspecified: Secondary | ICD-10-CM | POA: Diagnosis not present

## 2017-12-24 DIAGNOSIS — Z87891 Personal history of nicotine dependence: Secondary | ICD-10-CM | POA: Insufficient documentation

## 2017-12-24 DIAGNOSIS — Z79899 Other long term (current) drug therapy: Secondary | ICD-10-CM | POA: Diagnosis not present

## 2017-12-24 DIAGNOSIS — Z7901 Long term (current) use of anticoagulants: Secondary | ICD-10-CM | POA: Insufficient documentation

## 2017-12-24 DIAGNOSIS — D500 Iron deficiency anemia secondary to blood loss (chronic): Secondary | ICD-10-CM

## 2017-12-24 DIAGNOSIS — N920 Excessive and frequent menstruation with regular cycle: Secondary | ICD-10-CM | POA: Diagnosis not present

## 2017-12-24 LAB — CBC WITH DIFFERENTIAL/PLATELET
BASOS PCT: 0 %
Basophils Absolute: 0 10*3/uL (ref 0.0–0.1)
Eosinophils Absolute: 0.1 10*3/uL (ref 0.0–0.5)
Eosinophils Relative: 2 %
HEMATOCRIT: 36.8 % (ref 34.8–46.6)
HEMOGLOBIN: 11.7 g/dL (ref 11.6–15.9)
Lymphocytes Relative: 42 %
Lymphs Abs: 2 10*3/uL (ref 0.9–3.3)
MCH: 25.1 pg (ref 25.1–34.0)
MCHC: 31.8 g/dL (ref 31.5–36.0)
MCV: 79 fL — ABNORMAL LOW (ref 79.5–101.0)
MONOS PCT: 9 %
Monocytes Absolute: 0.4 10*3/uL (ref 0.1–0.9)
NEUTROS ABS: 2.2 10*3/uL (ref 1.5–6.5)
NEUTROS PCT: 47 %
Platelets: 212 10*3/uL (ref 145–400)
RBC: 4.66 MIL/uL (ref 3.70–5.45)
RDW: 17.8 % — ABNORMAL HIGH (ref 11.2–14.5)
WBC: 4.7 10*3/uL (ref 3.9–10.3)

## 2017-12-24 LAB — RETICULOCYTES
RBC.: 4.66 MIL/uL (ref 3.70–5.45)
RETIC COUNT ABSOLUTE: 69.9 10*3/uL (ref 33.7–90.7)
Retic Ct Pct: 1.5 % (ref 0.7–2.1)

## 2017-12-24 LAB — IRON AND TIBC
IRON: 27 ug/dL — AB (ref 41–142)
SATURATION RATIOS: 6 % — AB (ref 21–57)
TIBC: 461 ug/dL — AB (ref 236–444)
UIBC: 434 ug/dL

## 2017-12-24 LAB — VITAMIN B12: VITAMIN B 12: 676 pg/mL (ref 180–914)

## 2017-12-24 LAB — FERRITIN: FERRITIN: 9 ng/mL — AB (ref 11–307)

## 2017-12-24 NOTE — Telephone Encounter (Signed)
Called the patient to verify that I had to change her iv time to Monday, and she was okay with that. Per 7/19 los correction

## 2017-12-25 ENCOUNTER — Inpatient Hospital Stay: Payer: Medicaid Other

## 2017-12-27 ENCOUNTER — Inpatient Hospital Stay: Payer: Medicaid Other

## 2017-12-27 VITALS — BP 113/67 | HR 70 | Temp 98.9°F | Resp 17

## 2017-12-27 DIAGNOSIS — D500 Iron deficiency anemia secondary to blood loss (chronic): Secondary | ICD-10-CM

## 2017-12-27 DIAGNOSIS — D509 Iron deficiency anemia, unspecified: Secondary | ICD-10-CM | POA: Diagnosis not present

## 2017-12-27 MED ORDER — FERRIC CARBOXYMALTOSE 750 MG/15ML IV SOLN
750.0000 mg | Freq: Once | INTRAVENOUS | Status: AC
  Administered 2017-12-27: 750 mg via INTRAVENOUS
  Filled 2017-12-27: qty 15

## 2017-12-27 NOTE — Patient Instructions (Signed)
Ferric carboxymaltose injection What is this medicine? FERRIC CARBOXYMALTOSE (ferr-ik car-box-ee-mol-toes) is an iron complex. Iron is used to make healthy red blood cells, which carry oxygen and nutrients throughout the body. This medicine is used to treat anemia in people with chronic kidney disease or people who cannot take iron by mouth. This medicine may be used for other purposes; ask your health care provider or pharmacist if you have questions. COMMON BRAND NAME(S): Injectafer What should I tell my health care provider before I take this medicine? They need to know if you have any of these conditions: -anemia not caused by low iron levels -high levels of iron in the blood -liver disease -an unusual or allergic reaction to iron, other medicines, foods, dyes, or preservatives -pregnant or trying to get pregnant -breast-feeding How should I use this medicine? This medicine is for infusion into a vein. It is given by a health care professional in a hospital or clinic setting. Talk to your pediatrician regarding the use of this medicine in children. Special care may be needed. Overdosage: If you think you have taken too much of this medicine contact a poison control center or emergency room at once. NOTE: This medicine is only for you. Do not share this medicine with others. What if I miss a dose? It is important not to miss your dose. Call your doctor or health care professional if you are unable to keep an appointment. What may interact with this medicine? Do not take this medicine with any of the following medications: -deferoxamine -dimercaprol -other iron products This medicine may also interact with the following medications: -chloramphenicol -deferasirox This list may not describe all possible interactions. Give your health care provider a list of all the medicines, herbs, non-prescription drugs, or dietary supplements you use. Also tell them if you smoke, drink alcohol, or use  illegal drugs. Some items may interact with your medicine. What should I watch for while using this medicine? Visit your doctor or health care professional regularly. Tell your doctor if your symptoms do not start to get better or if they get worse. You may need blood work done while you are taking this medicine. You may need to follow a special diet. Talk to your doctor. Foods that contain iron include: whole grains/cereals, dried fruits, beans, or peas, leafy green vegetables, and organ meats (liver, kidney). What side effects may I notice from receiving this medicine? Side effects that you should report to your doctor or health care professional as soon as possible: -allergic reactions like skin rash, itching or hives, swelling of the face, lips, or tongue -breathing problems -changes in blood pressure -feeling faint or lightheaded, falls -flushing, sweating, or hot feelings Side effects that usually do not require medical attention (report to your doctor or health care professional if they continue or are bothersome): -changes in taste -constipation -dizziness -headache -nausea -pain, redness, or irritation at site where injected -vomiting This list may not describe all possible side effects. Call your doctor for medical advice about side effects. You may report side effects to FDA at 1-800-FDA-1088. Where should I keep my medicine? This drug is given in a hospital or clinic and will not be stored at home. NOTE: This sheet is a summary. It may not cover all possible information. If you have questions about this medicine, talk to your doctor, pharmacist, or health care provider.  2018 Elsevier/Gold Standard (2015-06-27 11:20:47)  

## 2018-01-01 HISTORY — PX: OTHER SURGICAL HISTORY: SHX169

## 2018-01-18 ENCOUNTER — Emergency Department (HOSPITAL_COMMUNITY): Payer: Medicaid Other

## 2018-01-18 ENCOUNTER — Inpatient Hospital Stay (HOSPITAL_COMMUNITY)
Admission: EM | Admit: 2018-01-18 | Discharge: 2018-01-22 | DRG: 856 | Disposition: A | Discharge status: Home / Self Care | Payer: Medicaid Other | Attending: Internal Medicine | Admitting: Internal Medicine

## 2018-01-18 ENCOUNTER — Encounter (HOSPITAL_COMMUNITY): Payer: Self-pay | Admitting: Emergency Medicine

## 2018-01-18 DIAGNOSIS — L7634 Postprocedural seroma of skin and subcutaneous tissue following other procedure: Secondary | ICD-10-CM | POA: Diagnosis present

## 2018-01-18 DIAGNOSIS — K08409 Partial loss of teeth, unspecified cause, unspecified class: Secondary | ICD-10-CM | POA: Diagnosis present

## 2018-01-18 DIAGNOSIS — A419 Sepsis, unspecified organism: Secondary | ICD-10-CM | POA: Diagnosis present

## 2018-01-18 DIAGNOSIS — I456 Pre-excitation syndrome: Secondary | ICD-10-CM | POA: Diagnosis present

## 2018-01-18 DIAGNOSIS — Z87440 Personal history of urinary (tract) infections: Secondary | ICD-10-CM

## 2018-01-18 DIAGNOSIS — D500 Iron deficiency anemia secondary to blood loss (chronic): Secondary | ICD-10-CM | POA: Diagnosis present

## 2018-01-18 DIAGNOSIS — Z86718 Personal history of other venous thrombosis and embolism: Secondary | ICD-10-CM

## 2018-01-18 DIAGNOSIS — Y834 Other reconstructive surgery as the cause of abnormal reaction of the patient, or of later complication, without mention of misadventure at the time of the procedure: Secondary | ICD-10-CM | POA: Diagnosis present

## 2018-01-18 DIAGNOSIS — Z87891 Personal history of nicotine dependence: Secondary | ICD-10-CM

## 2018-01-18 DIAGNOSIS — R55 Syncope and collapse: Secondary | ICD-10-CM | POA: Diagnosis not present

## 2018-01-18 DIAGNOSIS — T8144XA Sepsis following a procedure, initial encounter: Principal | ICD-10-CM | POA: Diagnosis present

## 2018-01-18 DIAGNOSIS — L03319 Cellulitis of trunk, unspecified: Secondary | ICD-10-CM | POA: Diagnosis present

## 2018-01-18 DIAGNOSIS — K649 Unspecified hemorrhoids: Secondary | ICD-10-CM | POA: Diagnosis not present

## 2018-01-18 DIAGNOSIS — Z91040 Latex allergy status: Secondary | ICD-10-CM

## 2018-01-18 DIAGNOSIS — T8140XA Infection following a procedure, unspecified, initial encounter: Secondary | ICD-10-CM | POA: Diagnosis present

## 2018-01-18 LAB — I-STAT TROPONIN, ED: Troponin i, poc: 0.01 ng/mL (ref 0.00–0.08)

## 2018-01-18 LAB — I-STAT CG4 LACTIC ACID, ED: LACTIC ACID, VENOUS: 1.09 mmol/L (ref 0.5–1.9)

## 2018-01-18 NOTE — ED Triage Notes (Signed)
Pt reports she had liposuction to back area, breast augmentation, tummy tuck in [place] few weeks ago, pt was there for 17 days post procedure. Pt returned 2 weeks ago and started to develop severe pain near where her liposuction was done, pt tachycardic and febrile in triage.

## 2018-01-19 ENCOUNTER — Emergency Department (HOSPITAL_COMMUNITY): Payer: Medicaid Other

## 2018-01-19 ENCOUNTER — Encounter (HOSPITAL_COMMUNITY)
Admission: EM | Disposition: A | Discharge status: Home / Self Care | Payer: Self-pay | Source: Home / Self Care | Attending: Internal Medicine

## 2018-01-19 ENCOUNTER — Inpatient Hospital Stay (HOSPITAL_COMMUNITY): Payer: Medicaid Other | Admitting: Certified Registered"

## 2018-01-19 ENCOUNTER — Encounter (HOSPITAL_COMMUNITY): Payer: Self-pay | Admitting: Internal Medicine

## 2018-01-19 ENCOUNTER — Other Ambulatory Visit: Payer: Self-pay

## 2018-01-19 DIAGNOSIS — Z87440 Personal history of urinary (tract) infections: Secondary | ICD-10-CM | POA: Diagnosis not present

## 2018-01-19 DIAGNOSIS — R7989 Other specified abnormal findings of blood chemistry: Secondary | ICD-10-CM | POA: Diagnosis not present

## 2018-01-19 DIAGNOSIS — Y834 Other reconstructive surgery as the cause of abnormal reaction of the patient, or of later complication, without mention of misadventure at the time of the procedure: Secondary | ICD-10-CM | POA: Diagnosis present

## 2018-01-19 DIAGNOSIS — K08409 Partial loss of teeth, unspecified cause, unspecified class: Secondary | ICD-10-CM | POA: Diagnosis present

## 2018-01-19 DIAGNOSIS — D500 Iron deficiency anemia secondary to blood loss (chronic): Secondary | ICD-10-CM | POA: Diagnosis present

## 2018-01-19 DIAGNOSIS — Z91040 Latex allergy status: Secondary | ICD-10-CM | POA: Diagnosis not present

## 2018-01-19 DIAGNOSIS — Z87891 Personal history of nicotine dependence: Secondary | ICD-10-CM | POA: Diagnosis not present

## 2018-01-19 DIAGNOSIS — T8144XA Sepsis following a procedure, initial encounter: Secondary | ICD-10-CM | POA: Diagnosis present

## 2018-01-19 DIAGNOSIS — K649 Unspecified hemorrhoids: Secondary | ICD-10-CM | POA: Diagnosis not present

## 2018-01-19 DIAGNOSIS — A419 Sepsis, unspecified organism: Secondary | ICD-10-CM | POA: Diagnosis present

## 2018-01-19 DIAGNOSIS — T8140XA Infection following a procedure, unspecified, initial encounter: Secondary | ICD-10-CM | POA: Diagnosis present

## 2018-01-19 DIAGNOSIS — Z86718 Personal history of other venous thrombosis and embolism: Secondary | ICD-10-CM | POA: Diagnosis not present

## 2018-01-19 DIAGNOSIS — R55 Syncope and collapse: Secondary | ICD-10-CM | POA: Diagnosis not present

## 2018-01-19 DIAGNOSIS — L7634 Postprocedural seroma of skin and subcutaneous tissue following other procedure: Secondary | ICD-10-CM | POA: Diagnosis present

## 2018-01-19 DIAGNOSIS — I456 Pre-excitation syndrome: Secondary | ICD-10-CM | POA: Diagnosis present

## 2018-01-19 DIAGNOSIS — L03319 Cellulitis of trunk, unspecified: Secondary | ICD-10-CM | POA: Diagnosis present

## 2018-01-19 HISTORY — PX: EXCISION MASS ABDOMINAL: SHX6701

## 2018-01-19 HISTORY — PX: INCISION AND DRAINAGE OF WOUND: SHX1803

## 2018-01-19 LAB — PROTIME-INR
INR: 1.03
INR: 1.06
PROTHROMBIN TIME: 13.7 s (ref 11.4–15.2)
Prothrombin Time: 13.4 seconds (ref 11.4–15.2)

## 2018-01-19 LAB — COMPREHENSIVE METABOLIC PANEL
ALK PHOS: 63 U/L (ref 38–126)
ALT: 25 U/L (ref 0–44)
ALT: 28 U/L (ref 0–44)
AST: 27 U/L (ref 15–41)
AST: 33 U/L (ref 15–41)
Albumin: 2.3 g/dL — ABNORMAL LOW (ref 3.5–5.0)
Albumin: 2.5 g/dL — ABNORMAL LOW (ref 3.5–5.0)
Alkaline Phosphatase: 75 U/L (ref 38–126)
Anion gap: 10 (ref 5–15)
Anion gap: 9 (ref 5–15)
BUN: 5 mg/dL — AB (ref 6–20)
CALCIUM: 8.3 mg/dL — AB (ref 8.9–10.3)
CHLORIDE: 101 mmol/L (ref 98–111)
CHLORIDE: 99 mmol/L (ref 98–111)
CO2: 22 mmol/L (ref 22–32)
CO2: 24 mmol/L (ref 22–32)
CREATININE: 0.74 mg/dL (ref 0.44–1.00)
CREATININE: 0.78 mg/dL (ref 0.44–1.00)
Calcium: 8.5 mg/dL — ABNORMAL LOW (ref 8.9–10.3)
GFR calc Af Amer: >60 mL/min (ref >60)
GFR calc Af Amer: >60 mL/min (ref >60)
GFR calc non Af Amer: >60 mL/min (ref >60)
Glucose, Bld: 121 mg/dL — ABNORMAL HIGH (ref 70–99)
Glucose, Bld: 121 mg/dL — ABNORMAL HIGH (ref 70–99)
Potassium: 3.9 mmol/L (ref 3.5–5.1)
Potassium: 4 mmol/L (ref 3.5–5.1)
SODIUM: 131 mmol/L — AB (ref 135–145)
SODIUM: 134 mmol/L — AB (ref 135–145)
Total Bilirubin: 0.3 mg/dL (ref 0.3–1.2)
Total Bilirubin: 0.5 mg/dL (ref 0.3–1.2)
Total Protein: 6.2 g/dL — ABNORMAL LOW (ref 6.5–8.1)
Total Protein: 6.3 g/dL — ABNORMAL LOW (ref 6.5–8.1)

## 2018-01-19 LAB — URINALYSIS, ROUTINE W REFLEX MICROSCOPIC
Bilirubin Urine: NEGATIVE
GLUCOSE, UA: NEGATIVE mg/dL
HGB URINE DIPSTICK: NEGATIVE
Ketones, ur: NEGATIVE mg/dL
Leukocytes, UA: NEGATIVE
Nitrite: NEGATIVE
PH: 7 (ref 5.0–8.0)
Protein, ur: 30 mg/dL — AB
Specific Gravity, Urine: 1.019 (ref 1.005–1.030)

## 2018-01-19 LAB — CBC WITH DIFFERENTIAL/PLATELET
ABS IMMATURE GRANULOCYTES: 0.1 10*3/uL (ref 0.0–0.1)
ABS IMMATURE GRANULOCYTES: 0.1 10*3/uL (ref 0.0–0.1)
BASOS ABS: 0.1 10*3/uL (ref 0.0–0.1)
BASOS PCT: 0 %
BASOS PCT: 1 %
Basophils Absolute: 0.1 10*3/uL (ref 0.0–0.1)
EOS ABS: 0.1 10*3/uL (ref 0.0–0.7)
EOS PCT: 1 %
Eosinophils Absolute: 0.1 10*3/uL (ref 0.0–0.7)
Eosinophils Relative: 0 %
HCT: 33.4 % — ABNORMAL LOW (ref 36.0–46.0)
HCT: 33.5 % — ABNORMAL LOW (ref 36.0–46.0)
HEMOGLOBIN: 10.2 g/dL — AB (ref 12.0–15.0)
Hemoglobin: 10.3 g/dL — ABNORMAL LOW (ref 12.0–15.0)
IMMATURE GRANULOCYTES: 0 %
Immature Granulocytes: 1 %
LYMPHS ABS: 2.1 10*3/uL (ref 0.7–4.0)
LYMPHS PCT: 12 %
Lymphocytes Relative: 14 %
Lymphs Abs: 2.2 10*3/uL (ref 0.7–4.0)
MCH: 26.7 pg (ref 26.0–34.0)
MCH: 27.2 pg (ref 26.0–34.0)
MCHC: 30.4 g/dL (ref 30.0–36.0)
MCHC: 30.8 g/dL (ref 30.0–36.0)
MCV: 87.7 fL (ref 78.0–100.0)
MCV: 88.1 fL (ref 78.0–100.0)
MONOS PCT: 11 %
Monocytes Absolute: 1.7 10*3/uL — ABNORMAL HIGH (ref 0.1–1.0)
Monocytes Absolute: 2.1 10*3/uL — ABNORMAL HIGH (ref 0.1–1.0)
Monocytes Relative: 12 %
NEUTROS ABS: 13.5 10*3/uL — AB (ref 1.7–7.7)
NEUTROS PCT: 76 %
Neutro Abs: 11.1 10*3/uL — ABNORMAL HIGH (ref 1.7–7.7)
Neutrophils Relative %: 74 %
PLATELETS: 374 10*3/uL (ref 150–400)
PLATELETS: 402 10*3/uL — AB (ref 150–400)
RBC: 3.79 MIL/uL — ABNORMAL LOW (ref 3.87–5.11)
RBC: 3.82 MIL/uL — ABNORMAL LOW (ref 3.87–5.11)
RDW: 20 % — ABNORMAL HIGH (ref 11.5–15.5)
RDW: 20.2 % — ABNORMAL HIGH (ref 11.5–15.5)
WBC: 15.1 10*3/uL — ABNORMAL HIGH (ref 4.0–10.5)
WBC: 17.9 10*3/uL — ABNORMAL HIGH (ref 4.0–10.5)

## 2018-01-19 LAB — LACTIC ACID, PLASMA
LACTIC ACID, VENOUS: 0.8 mmol/L (ref 0.5–1.9)
Lactic Acid, Venous: 0.8 mmol/L (ref 0.5–1.9)

## 2018-01-19 LAB — TYPE AND SCREEN
ABO/RH(D): A POS
Antibody Screen: NEGATIVE

## 2018-01-19 LAB — APTT: aPTT: 35 seconds (ref 24–36)

## 2018-01-19 LAB — TSH: TSH: 4.473 u[IU]/mL (ref 0.350–4.500)

## 2018-01-19 LAB — I-STAT CG4 LACTIC ACID, ED: LACTIC ACID, VENOUS: 1.2 mmol/L (ref 0.5–1.9)

## 2018-01-19 LAB — HIV ANTIBODY (ROUTINE TESTING W REFLEX): HIV SCREEN 4TH GENERATION: NONREACTIVE

## 2018-01-19 LAB — ABO/RH: ABO/RH(D): A POS

## 2018-01-19 LAB — PROCALCITONIN: PROCALCITONIN: 0.13 ng/mL

## 2018-01-19 LAB — I-STAT BETA HCG BLOOD, ED (MC, WL, AP ONLY): I-stat hCG, quantitative: <5 m[IU]/mL (ref <5)

## 2018-01-19 SURGERY — IRRIGATION AND DEBRIDEMENT WOUND
Anesthesia: General | Site: Back

## 2018-01-19 MED ORDER — SODIUM CHLORIDE 0.9 % IV SOLN: INTRAVENOUS | Status: DC

## 2018-01-19 MED ORDER — 0.9 % SODIUM CHLORIDE (POUR BTL) OPTIME
TOPICAL | Status: DC | PRN
  Administered 2018-01-19 (×2): 1000 mL

## 2018-01-19 MED ORDER — MIDAZOLAM HCL 2 MG/2ML IJ SOLN
INTRAMUSCULAR | Status: AC
  Filled 2018-01-19: qty 2

## 2018-01-19 MED ORDER — ONDANSETRON HCL 4 MG/2ML IJ SOLN
4.0000 mg | Freq: Four times a day (QID) | INTRAMUSCULAR | Status: DC | PRN
  Administered 2018-01-21: 4 mg via INTRAVENOUS
  Filled 2018-01-19: qty 2

## 2018-01-19 MED ORDER — HYDROMORPHONE HCL 1 MG/ML IJ SOLN
0.2500 mg | INTRAMUSCULAR | Status: DC | PRN
  Administered 2018-01-19 (×2): 0.5 mg via INTRAVENOUS

## 2018-01-19 MED ORDER — DEXAMETHASONE SODIUM PHOSPHATE 10 MG/ML IJ SOLN
INTRAMUSCULAR | Status: AC
  Filled 2018-01-19: qty 1

## 2018-01-19 MED ORDER — LIDOCAINE 2% (20 MG/ML) 5 ML SYRINGE
INTRAMUSCULAR | Status: AC
  Filled 2018-01-19: qty 5

## 2018-01-19 MED ORDER — ROCURONIUM BROMIDE 10 MG/ML (PF) SYRINGE
PREFILLED_SYRINGE | INTRAVENOUS | Status: AC
  Filled 2018-01-19: qty 10

## 2018-01-19 MED ORDER — FENTANYL CITRATE (PF) 250 MCG/5ML IJ SOLN
INTRAMUSCULAR | Status: AC
  Filled 2018-01-19: qty 5

## 2018-01-19 MED ORDER — HYDROMORPHONE HCL 1 MG/ML IJ SOLN
INTRAMUSCULAR | Status: AC
  Filled 2018-01-19: qty 1

## 2018-01-19 MED ORDER — ACETAMINOPHEN 650 MG RE SUPP: 650.0000 mg | Freq: Four times a day (QID) | RECTAL | Status: DC | PRN

## 2018-01-19 MED ORDER — SUGAMMADEX SODIUM 200 MG/2ML IV SOLN
INTRAVENOUS | Status: DC | PRN
  Administered 2018-01-19: 300 mg via INTRAVENOUS

## 2018-01-19 MED ORDER — PIPERACILLIN-TAZOBACTAM 3.375 G IVPB
3.3750 g | Freq: Three times a day (TID) | INTRAVENOUS | Status: DC
  Administered 2018-01-19 – 2018-01-22 (×10): 3.375 g via INTRAVENOUS
  Filled 2018-01-19 (×11): qty 50

## 2018-01-19 MED ORDER — VANCOMYCIN HCL IN DEXTROSE 1-5 GM/200ML-% IV SOLN
1000.0000 mg | Freq: Once | INTRAVENOUS | Status: AC
  Administered 2018-01-19: 1000 mg via INTRAVENOUS

## 2018-01-19 MED ORDER — ENOXAPARIN SODIUM 40 MG/0.4ML ~~LOC~~ SOLN
40.0000 mg | SUBCUTANEOUS | Status: DC
  Administered 2018-01-20 – 2018-01-21 (×2): 40 mg via SUBCUTANEOUS
  Filled 2018-01-19 (×2): qty 0.4

## 2018-01-19 MED ORDER — SENNOSIDES-DOCUSATE SODIUM 8.6-50 MG PO TABS: 1.0000 | ORAL_TABLET | Freq: Every evening | ORAL | Status: DC | PRN

## 2018-01-19 MED ORDER — PIPERACILLIN-TAZOBACTAM 3.375 G IVPB
INTRAVENOUS | Status: AC
  Filled 2018-01-19: qty 50

## 2018-01-19 MED ORDER — ONDANSETRON HCL 4 MG/2ML IJ SOLN
INTRAMUSCULAR | Status: DC | PRN
  Administered 2018-01-19: 4 mg via INTRAVENOUS

## 2018-01-19 MED ORDER — PROPOFOL 10 MG/ML IV BOLUS
INTRAVENOUS | Status: DC | PRN
  Administered 2018-01-19: 160 mg via INTRAVENOUS

## 2018-01-19 MED ORDER — PIPERACILLIN-TAZOBACTAM 3.375 G IVPB
3.3750 g | Freq: Once | INTRAVENOUS | Status: AC
  Administered 2018-01-19: 3.375 g via INTRAVENOUS

## 2018-01-19 MED ORDER — HYDROCODONE-ACETAMINOPHEN 5-325 MG PO TABS
1.0000 | ORAL_TABLET | ORAL | Status: DC | PRN
  Administered 2018-01-21 – 2018-01-22 (×2): 2 via ORAL
  Filled 2018-01-19 (×2): qty 1
  Filled 2018-01-19 (×2): qty 2

## 2018-01-19 MED ORDER — FENTANYL CITRATE (PF) 100 MCG/2ML IJ SOLN
INTRAMUSCULAR | Status: DC | PRN
  Administered 2018-01-19: 150 ug via INTRAVENOUS
  Administered 2018-01-19: 100 ug via INTRAVENOUS

## 2018-01-19 MED ORDER — ONDANSETRON HCL 4 MG/2ML IJ SOLN
INTRAMUSCULAR | Status: AC
  Filled 2018-01-19: qty 2

## 2018-01-19 MED ORDER — ONDANSETRON HCL 4 MG/2ML IJ SOLN
4.0000 mg | Freq: Once | INTRAMUSCULAR | Status: AC
  Administered 2018-01-19: 4 mg via INTRAVENOUS

## 2018-01-19 MED ORDER — SODIUM CHLORIDE 0.9 % IV SOLN
INTRAVENOUS | Status: DC | PRN
  Administered 2018-01-19: 1000 mL via INTRAVENOUS

## 2018-01-19 MED ORDER — GLYCOPYRROLATE PF 0.2 MG/ML IJ SOSY
PREFILLED_SYRINGE | INTRAMUSCULAR | Status: AC
  Filled 2018-01-19: qty 1

## 2018-01-19 MED ORDER — VANCOMYCIN HCL IN DEXTROSE 1-5 GM/200ML-% IV SOLN
INTRAVENOUS | Status: AC
  Filled 2018-01-19: qty 200

## 2018-01-19 MED ORDER — MIDAZOLAM HCL 5 MG/5ML IJ SOLN
INTRAMUSCULAR | Status: DC | PRN
  Administered 2018-01-19: 2 mg via INTRAVENOUS

## 2018-01-19 MED ORDER — ONDANSETRON HCL 4 MG PO TABS
4.0000 mg | ORAL_TABLET | Freq: Four times a day (QID) | ORAL | Status: DC | PRN
  Administered 2018-01-20: 4 mg via ORAL
  Filled 2018-01-19: qty 1

## 2018-01-19 MED ORDER — HYDROMORPHONE HCL 1 MG/ML IJ SOLN
1.0000 mg | Freq: Once | INTRAMUSCULAR | Status: AC
  Administered 2018-01-19: 1 mg via INTRAVENOUS

## 2018-01-19 MED ORDER — IOHEXOL 300 MG/ML  SOLN
100.0000 mL | Freq: Once | INTRAMUSCULAR | Status: AC | PRN
  Administered 2018-01-19: 100 mL via INTRAVENOUS

## 2018-01-19 MED ORDER — ACETAMINOPHEN 325 MG PO TABS
650.0000 mg | ORAL_TABLET | Freq: Four times a day (QID) | ORAL | Status: DC | PRN
  Administered 2018-01-19 – 2018-01-20 (×4): 650 mg via ORAL
  Filled 2018-01-19 (×4): qty 2

## 2018-01-19 MED ORDER — MORPHINE SULFATE (PF) 2 MG/ML IV SOLN
1.0000 mg | INTRAVENOUS | Status: DC | PRN
  Administered 2018-01-19 (×2): 1 mg via INTRAVENOUS
  Administered 2018-01-20: 0.5 mg via INTRAVENOUS
  Administered 2018-01-20 – 2018-01-21 (×2): 1 mg via INTRAVENOUS
  Filled 2018-01-19 (×5): qty 1

## 2018-01-19 MED ORDER — VANCOMYCIN HCL IN DEXTROSE 750-5 MG/150ML-% IV SOLN
750.0000 mg | Freq: Three times a day (TID) | INTRAVENOUS | Status: DC
Start: 2018-01-19 — End: 2018-01-22
  Administered 2018-01-19 – 2018-01-22 (×10): 750 mg via INTRAVENOUS
  Filled 2018-01-19 (×12): qty 150

## 2018-01-19 MED ORDER — PROPOFOL 10 MG/ML IV BOLUS
INTRAVENOUS | Status: AC
  Filled 2018-01-19: qty 20

## 2018-01-19 MED ORDER — PHENYLEPHRINE 40 MCG/ML (10ML) SYRINGE FOR IV PUSH (FOR BLOOD PRESSURE SUPPORT)
PREFILLED_SYRINGE | INTRAVENOUS | Status: AC
  Filled 2018-01-19: qty 10

## 2018-01-19 MED ORDER — LACTATED RINGERS IV SOLN
INTRAVENOUS | Status: DC
  Administered 2018-01-20: 10 mL/h via INTRAVENOUS

## 2018-01-19 MED ORDER — ROCURONIUM BROMIDE 100 MG/10ML IV SOLN
INTRAVENOUS | Status: DC | PRN
  Administered 2018-01-19: 50 mg via INTRAVENOUS

## 2018-01-19 MED ORDER — SODIUM CHLORIDE 0.9 % IV BOLUS
500.0000 mL | Freq: Once | INTRAVENOUS | Status: AC
  Administered 2018-01-19: 500 mL via INTRAVENOUS

## 2018-01-19 MED ORDER — LACTATED RINGERS IV SOLN
INTRAVENOUS | Status: DC | PRN
  Administered 2018-01-19 (×3): via INTRAVENOUS

## 2018-01-19 SURGICAL SUPPLY — 41 items
BAG DECANTER FOR FLEXI CONT (MISCELLANEOUS) IMPLANT
BLADE CLIPPER SURG (BLADE) IMPLANT
BNDG GAUZE ELAST 4 BULKY (GAUZE/BANDAGES/DRESSINGS) ×2 IMPLANT
CANISTER SUCT 3000ML PPV (MISCELLANEOUS) ×4 IMPLANT
CHLORAPREP W/TINT 26ML (MISCELLANEOUS) ×2 IMPLANT
CONT SPEC 4OZ CLIKSEAL STRL BL (MISCELLANEOUS) ×4 IMPLANT
COVER SURGICAL LIGHT HANDLE (MISCELLANEOUS) ×4 IMPLANT
DRAPE HALF SHEET 40X57 (DRAPES) IMPLANT
DRAPE IMP U-DRAPE 54X76 (DRAPES) ×6 IMPLANT
DRAPE INCISE IOBAN 66X45 STRL (DRAPES) IMPLANT
DRAPE LAPAROTOMY T 98X78 PEDS (DRAPES) ×4 IMPLANT
DRSG ADAPTIC 3X8 NADH LF (GAUZE/BANDAGES/DRESSINGS) IMPLANT
DRSG PAD ABDOMINAL 8X10 ST (GAUZE/BANDAGES/DRESSINGS) ×6 IMPLANT
DRSG VAC ATS LRG SENSATRAC (GAUZE/BANDAGES/DRESSINGS) IMPLANT
DRSG VAC ATS MED SENSATRAC (GAUZE/BANDAGES/DRESSINGS) IMPLANT
DRSG VAC ATS SM SENSATRAC (GAUZE/BANDAGES/DRESSINGS) IMPLANT
ELECT CAUTERY BLADE 6.4 (BLADE) ×4 IMPLANT
ELECT COATED BLADE 2.86 ST (ELECTRODE) ×4 IMPLANT
ELECT REM PT RETURN 9FT ADLT (ELECTROSURGICAL) ×4
ELECTRODE REM PT RTRN 9FT ADLT (ELECTROSURGICAL) ×2 IMPLANT
GAUZE SPONGE 4X4 12PLY STRL (GAUZE/BANDAGES/DRESSINGS) ×2 IMPLANT
GLOVE BIO SURGEON STRL SZ 6 (GLOVE) ×4 IMPLANT
GLOVE SURG SS PI 6.0 STRL IVOR (GLOVE) ×4 IMPLANT
GOWN STRL REUS W/ TWL LRG LVL3 (GOWN DISPOSABLE) ×4 IMPLANT
GOWN STRL REUS W/TWL LRG LVL3 (GOWN DISPOSABLE) ×8
HANDPIECE INTERPULSE COAX TIP (DISPOSABLE)
KIT BASIN OR (CUSTOM PROCEDURE TRAY) ×4 IMPLANT
KIT TURNOVER KIT B (KITS) ×4 IMPLANT
NS IRRIG 1000ML POUR BTL (IV SOLUTION) ×6 IMPLANT
PACK GENERAL/GYN (CUSTOM PROCEDURE TRAY) ×4 IMPLANT
PACK UNIVERSAL I (CUSTOM PROCEDURE TRAY) ×4 IMPLANT
PAD ABD 8X10 STRL (GAUZE/BANDAGES/DRESSINGS) ×4 IMPLANT
PAD ARMBOARD 7.5X6 YLW CONV (MISCELLANEOUS) ×16 IMPLANT
SET HNDPC FAN SPRY TIP SCT (DISPOSABLE) IMPLANT
SURGILUBE 2OZ TUBE FLIPTOP (MISCELLANEOUS) IMPLANT
SUT VIC AB 5-0 PS2 18 (SUTURE) IMPLANT
SWAB COLLECTION DEVICE MRSA (MISCELLANEOUS) IMPLANT
SWAB CULTURE ESWAB REG 1ML (MISCELLANEOUS) IMPLANT
TOWEL OR 17X24 6PK STRL BLUE (TOWEL DISPOSABLE) ×4 IMPLANT
TOWEL OR 17X26 10 PK STRL BLUE (TOWEL DISPOSABLE) ×4 IMPLANT
UNDERPAD 30X30 (UNDERPADS AND DIAPERS) ×4 IMPLANT

## 2018-01-19 NOTE — Op Note (Signed)
Operative Note   DATE OF OPERATION: 8.14.19  [facility]  SURGICAL DIVISION: Plastic Surgery  PREOPERATIVE DIAGNOSES:  1. Seroma back 2. Seroma abdomen 3. Cellulitis trunk 4. History abdominoplasty, liposuction  POSTOPERATIVE DIAGNOSES:  same  PROCEDURE:  1. Incision drainage seroma back 2. Incision drainage seroma abdomen  SURGEON: Glenna FellowsBrinda Kolson Chovanec MD MBA  ASSISTANT: none  ANESTHESIA:  General.   EBL: 50 ml  COMPLICATIONS: None immediate.   INDICATIONS FOR PROCEDURE:  The patient, Erin Sheldonshley, is a 32 y.o. female born on 02/22/1986, is here for treatment of seromas back and abdomen with associated cellulitis back, fevers. She is recently post operative breast augmentation, abdominoplasty, back, thigh, and arm liposuction in [place].   FINDINGS: Fluid collection subcutaneous midline back 15 cm in craniocaudal direction and extending over posterior iliac spines bilateral. Fluid here turbid. Abdomen incision left of midline with 8 x 1 cm area superficial eschar excised. Seroma cavity over left hemiabdomen, clear tan fluid noted.  DESCRIPTION OF PROCEDURE:  The patient's operative site was marked with the patient in the preoperative area. The patient was taken to the operating room. SCDs were placed. She received her scheduled IV antibiotics. Patient placed in prone position. The patient's operative site was prepped and draped in a sterile fashion. A time out was performed and all information was confirmed to be correct. Patient had multiple incisions from liposuction ports; the central incision superior to gluteal crease was incised and extended superiorly. Able to express from this turbid seroma fluid. Incision made in prior scar left buttock; this did not communicate with seroma cavity and was closed with simple 4-0 monocryl. Cavity treated with curettage and irrigated with saline. Wound packed and covered with dry dressing.  Patient returned to supine  position and re prepped and draped. Sharp excsion of skin eschar completed. Seroma cavity entered and clear tan fluid drained. Curettage perfomed over cavity and irrigated with saline. Medial skin excision closed with 4-0 monocrl in dermis and 4-0 monocryl skin closure. Remainder left open. Wound packed and covered with dry dressing.  The patient was allowed to wake from anesthesia, extubated and taken to the recovery room in satisfactory condition.   SPECIMENS: culture seroma back and abdomen  DRAINS: none  Glenna FellowsBrinda Ulisses Vondrak, MD [facility] Plastic & Reconstructive Surgery [phone], pin [phone]

## 2018-01-19 NOTE — Anesthesia Postprocedure Evaluation (Signed)
Anesthesia Post Note  Patient: Erin Boyd  Procedure(s) Performed: icision and drainage of back (N/A Back) incision and drainage of the abdomen (N/A Abdomen)     Patient location during evaluation: PACU Anesthesia Type: General Level of consciousness: awake Pain management: pain level controlled Vital Signs Assessment: post-procedure vital signs reviewed and stable Respiratory status: spontaneous breathing Anesthetic complications: no    Last Vitals:  Vitals:   01/19/18 2130 01/19/18 2149  BP: 130/82 111/67  Pulse: (!) 114 (!) 113  Resp: 17 16  Temp:  36.7 C  SpO2: 96% 94%    Last Pain:  Vitals:   01/19/18 2149  TempSrc: Oral  PainSc:                  Amol Domanski

## 2018-01-19 NOTE — H&P (Signed)
History and Physical    Erin Robinsonsshley A Miley-Muhammad ZOX:[id number] DOB: 09/24/1985 DOA: 01/18/2018  PCP: Patient, No Pcp Per  Patient coming from: Home.  Chief Complaint: Pain.  Not feeling well.  HPI: Erin Robinsonsshley A Miley-Muhammad is a 32 y.o. female with history of iron deficiency anemia presents to the ER with complaint of increasing pain in the lower abdomen and back over the last 2 days with not feeling well.  Also has been having subjective feeling of fever and chills.  Denies any nausea vomiting diarrhea productive cough or chest pain.  Patient had liposuction breast augmentation and tummy tuck done in [place] on July 27 2 weeks ago and had just come back to [place] last week.  In addition to the pain patient also noticed some mild discharge from the surgical site.  ED Course: In the ER CT abdomen pelvis done shows seroma with possible infection at the surgical site in the lower abdomen.  Patient on admission and is tachycardic with heart rate around 130s with temperature of 103 F and leukocytosis.  ER physician discussed with on-call general surgeon who advised antibiotics and possible need for plastic surgery consult.  Review of Systems: As per HPI, rest all negative.   Past Medical History:  Diagnosis Date  . Cervical abnormality affecting pregnancy, antepartum 08/25/2015  . DVT (deep vein thrombosis) in pregnancy [facility](HCC)    dx clot in right arm in Jan 2017.  Been off Lovenox since Friday 08/23/15   . Headache    otc med prn  . Heartburn in pregnancy   . No pertinent past medical history   . Short cervix with cervical cerclage in second trimester, antepartum 09/06/2015    Past Surgical History:  Procedure Laterality Date  . CERVICAL CERCLAGE N/A 09/06/2015   Procedure: CERCLAGE CERVICAL;  Surgeon: Sherian ReinJody [name], MD;  Location: [facility];  Service: Gynecology;  Laterality: N/A;  . CESAREAN SECTION N/A 11/06/2015   Procedure: CESAREAN SECTION;  Surgeon: Huel CoteKathy  Richardson, MD;  [facility];  Service: Obstetrics;  Laterality: N/A;  . INDUCED ABORTION    . pediatric dental surgery    . WISDOM TOOTH EXTRACTION       reports that she quit smoking about 3 years ago. Her smoking use included cigarettes. She has a 3.50 pack-year smoking history. She has never used smokeless tobacco. She reports that she drinks alcohol. She reports that she does not use drugs.  Allergies  Allergen Reactions  . Latex Rash    Family History  Problem Relation Age of Onset  . Clotting disorder Neg Hx     Prior to Admission medications   Not on File    Physical Exam: Vitals:   01/19/18 0330 01/19/18 0345 01/19/18 0415 01/19/18 0436  BP: 103/65 100/61 102/65   Pulse: (!) 108 (!) 108 (!) 113   Resp:   18   Temp:    98.9 F (37.2 C)  TempSrc:    Oral  SpO2: 96% 96% 97%   Weight:      Height:          Constitutional: Moderately built and nourished. Vitals:   01/19/18 0330 01/19/18 0345 01/19/18 0415 01/19/18 0436  BP: 103/65 100/61 102/65   Pulse: (!) 108 (!) 108 (!) 113   Resp:   18   Temp:    98.9 F (37.2 C)  TempSrc:    Oral  SpO2: 96% 96% 97%   Weight:      Height:  Eyes: Anicteric no pallor. ENMT: No discharge from the ears eyes nose or mouth. Neck: No mass felt.  No neck rigidity. Respiratory: No rhonchi or crepitations. Cardiovascular: S1-S2 heard no murmurs appreciated. Abdomen: Soft nontender bowel sounds present. Musculoskeletal: No edema. Skin: No rash.  Do not see any obvious discharge from the surgical site at this time. Neurologic: Alert awake oriented to time place and person.  Moves all extremities. Psychiatric: Appears normal per normal affect.   Labs on Admission: I have personally reviewed following labs and imaging studies  CBC: Recent Labs  Lab 01/18/18 2340  WBC 15.1*  NEUTROABS 11.1*  HGB 10.2*  HCT 33.5*  MCV 87.7  PLT 402*   Basic Metabolic Panel: Recent Labs  Lab 01/18/18 2340    NA 131*  K 3.9  CL 99  CO2 22  GLUCOSE 121*  BUN 5*  CREATININE 0.74  CALCIUM 8.3*   GFR: Estimated Creatinine Clearance: 111.2 mL/min (by C-G formula based on SCr of 0.74 mg/dL). Liver Function Tests: Recent Labs  Lab 01/18/18 2340  AST 27  ALT 25  ALKPHOS 63  BILITOT 0.5  PROT 6.3*  ALBUMIN 2.5*   No results for input(s): LIPASE, AMYLASE in the last 168 hours. No results for input(s): AMMONIA in the last 168 hours. Coagulation Profile: Recent Labs  Lab 01/18/18 2340  INR 1.03   Cardiac Enzymes: No results for input(s): CKTOTAL, CKMB, CKMBINDEX, TROPONINI in the last 168 hours. BNP (last 3 results) No results for input(s): PROBNP in the last 8760 hours. HbA1C: No results for input(s): HGBA1C in the last 72 hours. CBG: No results for input(s): GLUCAP in the last 168 hours. Lipid Profile: No results for input(s): CHOL, HDL, LDLCALC, TRIG, CHOLHDL, LDLDIRECT in the last 72 hours. Thyroid Function Tests: No results for input(s): TSH, T4TOTAL, FREET4, T3FREE, THYROIDAB in the last 72 hours. Anemia Panel: No results for input(s): VITAMINB12, FOLATE, FERRITIN, TIBC, IRON, RETICCTPCT in the last 72 hours. Urine analysis:    Component Value Date/Time   COLORURINE AMBER (A) 01/19/2018 0145   APPEARANCEUR HAZY (A) 01/19/2018 0145   LABSPEC 1.019 01/19/2018 0145   PHURINE 7.0 01/19/2018 0145   GLUCOSEU NEGATIVE 01/19/2018 0145   HGBUR NEGATIVE 01/19/2018 0145   BILIRUBINUR NEGATIVE 01/19/2018 0145   KETONESUR NEGATIVE 01/19/2018 0145   PROTEINUR 30 (A) 01/19/2018 0145   UROBILINOGEN 0.2 02/13/2015 1100   NITRITE NEGATIVE 01/19/2018 0145   LEUKOCYTESUR NEGATIVE 01/19/2018 0145   Sepsis Labs: @LABRCNTIP (procalcitonin:4,lacticidven:4) )No results found for this or any previous visit (from the past 240 hour(s)).   Radiological Exams on Admission: Ct Abdomen Pelvis W Contrast  Result Date: 01/19/2018 CLINICAL DATA:  History of recent cosmetic surgery in [place] with pain and tachycardia as well as fevers EXAM: CT ABDOMEN AND PELVIS WITH CONTRAST TECHNIQUE: Multidetector CT imaging of the abdomen and pelvis was performed using the standard protocol following bolus administration of intravenous contrast. CONTRAST:  OMNIPAQUE IOHEXOL 300 MG/ML  SOLN COMPARISON:  None. FINDINGS: Lower chest: Lung bases demonstrate mild bibasilar atelectasis left greater than right. No sizable effusion is seen. There are changes consistent with bilateral breast augmentation consistent with the given clinical history. Hepatobiliary: No focal liver abnormality is seen. No gallstones, gallbladder wall thickening, or biliary dilatation. Pancreas: Unremarkable. No pancreatic ductal dilatation or surrounding inflammatory changes. Spleen: Normal in size without focal abnormality. Adrenals/Urinary Tract: Adrenal glands are unremarkable. Kidneys are normal, without renal calculi, focal lesion, or hydronephrosis. Bladder is unremarkable. Stomach/Bowel: The appendix  is not well visualized. No inflammatory changes to suggest appendicitis are noted. No obstructive or inflammatory changes are seen. Vascular/Lymphatic: No significant vascular findings are present. No enlarged abdominal or pelvic lymph nodes. Reproductive: Uterus and bilateral adnexa are unremarkable. Other: There are diffuse inflammatory changes identified in the subcutaneous tissues in the area of recent surgical intervention. In the anterior left lower abdominal wall, there is a fluid collection which measures approximately 1.8 by 7.8 cm in greatest AP and transverse dimension. It extends for approximately 12 cm in the craniocaudad projection. This is consistent with postoperative seroma. Additionally in the posterior abdominal wall just above the iliac crests there is a 15 by 2.5 cm fluid collection consistent with postoperative seroma. This extends for at least 9 cm in craniocaudad projection. Diffuse subcutaneous edema is  noted throughout the surgical beds. Smaller irregular fluid collections are noted in the buttock regions bilaterally. Musculoskeletal: No acute or significant osseous findings. IMPRESSION: There are postoperative changes consistent with the patient's given clinical history of breast augmentation, liposuction and tummy tuck. There is significant subcutaneous edema identified as well as multiple fluid collections consistent with postoperative seromas. There is likely some degree of underlying superinfection given the patient's clinical history. Electronically Signed   By: Alcide CleverMark  Lukens M.D.   On: 01/19/2018 02:59   Dg Chest Portable 1 View  Result Date: 01/19/2018 CLINICAL DATA:  Weight reduction surgeries. Now septic and short of breath. EXAM: PORTABLE CHEST 1 VIEW COMPARISON:  12/23/2015 FINDINGS: The heart size and mediastinal contours are within normal limits. Both lungs are clear. The visualized skeletal structures are unremarkable. IMPRESSION: No active disease. Electronically Signed   By: Burman NievesWilliam  Stevens M.D.   On: 01/19/2018 00:08     Assessment/Plan Principal Problem:   Sepsis (HCC) Active Problems:   Wolff-Parkinson-White (WPW) pattern   Iron deficiency anemia due to chronic blood loss    1. Possible developing sepsis from infected seroma with cellulitis at the surgical site where patient had liposuction and tummy tuck in the lower abdomen and back -at admission patient is tachycardic with heart rate 130 with temperature of 103 F and leukocytosis consistent with sepsis physiology.  Patient placed on fluids for sepsis protocol and started on empiric antibiotics.  Follow cultures continue to follow lactate procalcitonin level.  Please consult plastic surgery in a.m. for further input. 2. History of iron deficiency anemia -follow CBC. 3. History of WPW syndrome per the chart.  EKG is pending.   DVT prophylaxis: SCDs for now in anticipation of possible procedure. Code Status: Full  code. Family Communication: Discussed with patient. Disposition Plan: Home. Consults called: None. Admission status: Inpatient.  Given that patient has sepsis physiology and will need close monitoring.   Eduard ClosArshad N Kenroy Timberman MD [facility] Pager [phone].  If 7PM-7AM, please contact night-coverage [email or web address] Password [email or web address]  01/19/2018, 4:50 AM

## 2018-01-19 NOTE — Anesthesia Procedure Notes (Signed)
Procedure Name: Intubation Date/Time: 01/19/2018 7:33 PM Performed by: Eligha Bridegroom, CRNA Pre-anesthesia Checklist: Patient identified, Emergency Drugs available, Suction available, Patient being monitored and Timeout performed Patient Re-evaluated:Patient Re-evaluated prior to induction Oxygen Delivery Method: Circle system utilized Preoxygenation: Pre-oxygenation with 100% oxygen Induction Type: IV induction Ventilation: Mask ventilation without difficulty Laryngoscope Size: Mac and 4 Grade View: Grade I Tube type: Oral Tube size: 7.0 mm Number of attempts: 1 Airway Equipment and Method: Stylet Secured at: 22 cm Tube secured with: Tape Dental Injury: Teeth and Oropharynx as per pre-operative assessment

## 2018-01-19 NOTE — Anesthesia Preprocedure Evaluation (Addendum)
Anesthesia Evaluation  Patient identified by MRN, date of birth, ID band Patient awake    Reviewed: Allergy & Precautions, H&P , NPO status , Patient's Chart, lab work & pertinent test results  Airway Mallampati: I  TM Distance: >3 FB Neck ROM: Full    Dental no notable dental hx. (+) Teeth Intact, Dental Advisory Given   Pulmonary neg pulmonary ROS, former smoker,    Pulmonary exam normal breath sounds clear to auscultation       Cardiovascular + Peripheral Vascular Disease   Rhythm:Regular Rate:Normal     Neuro/Psych  Headaches, negative psych ROS   GI/Hepatic negative GI ROS, Neg liver ROS,   Endo/Other  negative endocrine ROS  Renal/GU negative Renal ROS  negative genitourinary   Musculoskeletal   Abdominal   Peds  Hematology negative hematology ROS (+)   Anesthesia Other Findings   Reproductive/Obstetrics negative OB ROS                            Anesthesia Physical Anesthesia Plan  ASA: II  Anesthesia Plan: General   Post-op Pain Management:    Induction: Intravenous  PONV Risk Score and Plan: 4 or greater and Ondansetron, Dexamethasone and Midazolam  Airway Management Planned: Oral ETT  Additional Equipment:   Intra-op Plan:   Post-operative Plan: Extubation in OR  Informed Consent: I have reviewed the patients History and Physical, chart, labs and discussed the procedure including the risks, benefits and alternatives for the proposed anesthesia with the patient or authorized representative who has indicated his/her understanding and acceptance.   Dental advisory given  Plan Discussed with: CRNA  Anesthesia Plan Comments:         Anesthesia Quick Evaluation

## 2018-01-19 NOTE — ED Provider Notes (Addendum)
[facility]   CSN: [id number] Arrival date & time: 01/18/18  2320     History   Chief Complaint Chief Complaint  Patient presents with  . Post-op Problem    HPI Erin Boyd is a 32 y.o. female.  This patient is a 32 year old female with history of iron deficiency anemia, prior DVT presenting for evaluation of back and abdominal pain.  She recently [place] in [place] which included breast augmentation, tummy tuck, and liposuction.  She began having increased pain over the past 2 days which became much more severe this evening.  Her pain is in her abdomen and low back.  She has not checked her temperature at home, however was febrile with a temperature of 103 here.  He denies any cough, difficulty breathing, or changes in bowel habits.  The history is provided by the patient.    Past Medical History:  Diagnosis Date  . Cervical abnormality affecting pregnancy, antepartum 08/25/2015  . DVT (deep vein thrombosis) in pregnancy [facility](HCC)    dx clot in right arm in Jan 2017.  Been off Lovenox since Friday 08/23/15   . Headache    otc med prn  . Heartburn in pregnancy   . No pertinent past medical history   . Short cervix with cervical cerclage in second trimester, antepartum 09/06/2015    Patient Active Problem List   Diagnosis Date Noted  . Iron deficiency anemia due to chronic blood loss 12/24/2017  . Breech birth 11/06/2015  . Preterm premature rupture of membranes (PPROM) with unknown onset of labor 10/09/2015  . Short cervix with cervical cerclage in second trimester, antepartum 09/06/2015  . Cervical abnormality affecting pregnancy, antepartum 08/25/2015  . DVT of upper extremity (deep vein thrombosis) (HCC) 07/06/2015  . UTI (urinary tract infection) 12/12/2013  . Drug overdose 12/11/2013  . Metabolic acidosis 12/11/2013  . Wolff-Parkinson-White (WPW) pattern 12/11/2013     Past Surgical History:  Procedure Laterality Date  . CERVICAL CERCLAGE N/A 09/06/2015   Procedure: CERCLAGE CERVICAL;  Surgeon: Sherian ReinJody [name], MD;  Location: [facility];  Service: Gynecology;  Laterality: N/A;  . CESAREAN SECTION N/A 11/06/2015   Procedure: CESAREAN SECTION;  Surgeon: Huel CoteKathy Richardson, MD;  [facility];  Service: Obstetrics;  Laterality: N/A;  . INDUCED ABORTION    . pediatric dental surgery    . WISDOM TOOTH EXTRACTION       OB History    Gravida  6   Para  2   Term  1   Preterm  1   AB  3   Living  1     SAB  1   TAB  2   Ectopic  0   Multiple  0   Live Births  1            Home Medications    Prior to Admission medications   Not on File    Family History Family History  Problem Relation Age of Onset  . Clotting disorder Neg Hx     Social History Social History   Tobacco Use  . Smoking status: Former Smoker    Packs/day: 0.50    Years: 7.00    Pack years: 3.50    Types: Cigarettes    Last attempt to quit: 06/08/2014    Years since quitting: 3.6  . Smokeless tobacco: Never Used  Substance Use Topics  . Alcohol use: Yes    Comment: occasional  wine - none with pregnancy  . Drug use: No     Allergies   Latex   Review of Systems Review of Systems  All other systems reviewed and are negative.    Physical Exam Updated Vital Signs BP 126/82 (BP Location: Right Wrist)   Pulse (!) 117   Temp (!) 101.2 F (38.4 C) (Oral)   Resp 17   Ht 5\' 10"  (1.778 m)   Wt 81.6 kg   SpO2 100%   BMI 25.83 kg/m   Physical Exam  Constitutional: She is oriented to person, place, and time. She appears well-developed and well-nourished. No distress.  HENT:  Head: Normocephalic and atraumatic.  Neck: Normal range of motion. Neck supple.  Cardiovascular: Normal rate and regular rhythm. Exam reveals no gallop and no friction rub.  No murmur heard. Pulmonary/Chest: Effort normal and breath sounds normal. No  respiratory distress. She has no wheezes.  Abdominal: Soft. Bowel sounds are normal. She exhibits no distension. There is no tenderness.  The wound dressings are in place to the breast and lower abdomen.  The breast appears to be healing well with no redness, warmth, or fluctuance.  The tissue underlying the abdominal incision appears firm, tender to the touch, and indurated.  The soft tissue to the low back has a similar appearance.  It is warm to the touch, indurated, and possibly fluctuant.  Musculoskeletal: Normal range of motion.  Neurological: She is alert and oriented to person, place, and time.  Skin: Skin is warm and dry. She is not diaphoretic.  Nursing note and vitals reviewed.    ED Treatments / Results  Labs (all labs ordered are listed, but only abnormal results are displayed) Labs Reviewed  COMPREHENSIVE METABOLIC PANEL - Abnormal; Notable for the following components:      Result Value   Sodium 131 (*)    Glucose, Bld 121 (*)    BUN 5 (*)    Calcium 8.3 (*)    Total Protein 6.3 (*)    Albumin 2.5 (*)    All other components within normal limits  CBC WITH DIFFERENTIAL/PLATELET - Abnormal; Notable for the following components:   WBC 15.1 (*)    RBC 3.82 (*)    Hemoglobin 10.2 (*)    HCT 33.5 (*)    RDW 20.0 (*)    Platelets 402 (*)    Neutro Abs 11.1 (*)    Monocytes Absolute 1.7 (*)    All other components within normal limits  CULTURE, BLOOD (ROUTINE X 2)  CULTURE, BLOOD (ROUTINE X 2)  PROTIME-INR  URINALYSIS, ROUTINE W REFLEX MICROSCOPIC  I-STAT CG4 LACTIC ACID, ED  I-STAT BETA HCG BLOOD, ED ([facility], [facility], [facility] ONLY)  I-STAT TROPONIN, ED  I-STAT CG4 LACTIC ACID, ED    EKG None  Radiology Dg Chest Portable 1 View  Result Date: 01/19/2018 CLINICAL DATA:  Weight reduction surgeries. Now septic and short of breath. EXAM: PORTABLE CHEST 1 VIEW COMPARISON:  12/23/2015 FINDINGS: The heart size and mediastinal contours are within normal limits. Both lungs are  clear. The visualized skeletal structures are unremarkable. IMPRESSION: No active disease. Electronically Signed   By: Burman Nieves M.D.   On: 01/19/2018 00:08    Procedures Procedures (including critical care time)  Medications Ordered in ED Medications  vancomycin (VANCOCIN) IVPB 1000 mg/200 mL premix (has no administration in time range)  piperacillin-tazobactam (ZOSYN) IVPB 3.375 g (has no administration in time range)  HYDROmorphone (DILAUDID) injection 1 mg (has no administration in  time range)  ondansetron (ZOFRAN) injection 4 mg (has no administration in time range)  0.9 %  sodium chloride infusion (has no administration in time range)     Initial Impression / Assessment and Plan / ED Course  I have reviewed the triage vital signs and the nursing notes.  Pertinent labs & imaging results that were available during my care of the patient were reviewed by me and considered in my medical decision making (see chart for details).  Patient CT scan shows no obvious abscess, but does show areas of what are believed to be seroma.  She has diffuse soft tissue inflammation consistent with cellulitis.  She has a white count of 16,000 and presented with fever to 103.  She was treated with vancomycin and Zosyn and will be admitted to the hospitalist service under the care of Dr. Toniann FailKakrakandy.  Care also discussed with Dr. Derrell Lollingamirez from general surgery.  CRITICAL CARE Performed by: Geoffery Lyonsouglas Alyssamarie Mounsey Total critical care time: 35 minutes Critical care time was exclusive of separately billable procedures and treating other patients. Critical care was necessary to treat or prevent imminent or life-threatening deterioration. Critical care was time spent personally by me on the following activities: development of treatment plan with patient and/or surrogate as well as nursing, discussions with consultants, evaluation of patient's response to treatment, examination of patient, obtaining history from  patient or surrogate, ordering and performing treatments and interventions, ordering and review of laboratory studies, ordering and review of radiographic studies, pulse oximetry and re-evaluation of patient's condition.   Final Clinical Impressions(s) / ED Diagnoses   Final diagnoses:  None    ED Discharge Orders    None       Geoffery Lyonselo, Nikolos Billig, MD 01/19/18 [id number]    Geoffery Lyonselo, Shakeia Krus, MD 02/03/18 [phone]

## 2018-01-19 NOTE — Transfer of Care (Signed)
Immediate Anesthesia Transfer of Care Note  Patient: Erin Boyd A Miley-Muhammad  Procedure(s) Performed: icision and drainage of back (N/A Back) incision and drainage of the abdomen (N/A Abdomen)  Patient Location: PACU  Anesthesia Type:General  Level of Consciousness: awake, alert  and oriented  Airway & Oxygen Therapy: Patient Spontanous Breathing and Patient connected to nasal cannula oxygen  Post-op Assessment: Report given to RN and Post -op Vital signs reviewed and stable  Post vital signs: Reviewed and stable  Last Vitals:  Vitals Value Taken Time  BP    Temp    Pulse    Resp 18 01/19/2018  8:31 PM  SpO2    Vitals shown include unvalidated device data.  Last Pain:  Vitals:   01/19/18 1620  TempSrc:   PainSc: 5          Complications: No apparent anesthesia complications

## 2018-01-19 NOTE — Consult Note (Signed)
[facility] Surgery Consult Note  Erin Boyd 1985-12-11  [id number].    Requesting MD: Erin Antonio, MD Chief Complaint/Reason for Consult: post-operative seroma vs infection HPI:  [name] is a 32 year old female who presented to [facility] emergency department 01/19/2018 with a chief complaint of back pain, generalized weakness, and fever.  She reports a recent history of breast augmentation, liposuction (back and extremities), and abdominoplasty on 12/31/17 in [place], [place].  She reports returning back to the [place] 01/13/2018 and since that time has experienced worsening back pain, fever, chills.  Her pain became unmanageable and her fever spiked to over 103 so she reported to emergency room for further evaluation.  She denies drainage from any surgical wounds.  She denies nausea or vomiting. Work-up in the emergency department significant for sinus tachycardia, fever, and leukocytosis over 15,000.  CT abdomen and pelvis was performed and was consistent with recent reported surgical history.  There were inflammatory changes in the patient's abdominal wall and back with multiple fluid collections.  ROS: Review of Systems  Constitutional: Positive for chills, fever and malaise/fatigue.  Gastrointestinal: Positive for abdominal pain. Negative for blood in stool, diarrhea, melena, nausea and vomiting.  Musculoskeletal: Positive for back pain.  All other systems reviewed and are negative.   Family History  Problem Relation Age of Onset  . Clotting disorder Neg Hx     Past Medical History:  Diagnosis Date  . Cervical abnormality affecting pregnancy, antepartum 08/25/2015  . DVT (deep vein thrombosis) in pregnancy [facility])    dx clot in right arm in Jan 2017.  Been off Lovenox since Friday 08/23/15   . Headache    otc med prn  . Heartburn in pregnancy   . No pertinent past medical history   . Short cervix with cervical cerclage in second trimester,  antepartum 09/06/2015    Past Surgical History:  Procedure Laterality Date  . CERVICAL CERCLAGE N/A 09/06/2015   Procedure: CERCLAGE CERVICAL;  Surgeon: Erin Contes, MD;  Location: [facility];  Service: Gynecology;  Laterality: N/A;  . CESAREAN SECTION N/A 11/06/2015   Procedure: CESAREAN SECTION;  Surgeon: Erin Compton, MD;  Location: [facility];  Service: Obstetrics;  Laterality: N/A;  . INDUCED ABORTION    . pediatric dental surgery    . WISDOM TOOTH EXTRACTION      Social History:  reports that she quit smoking about 3 years ago. Her smoking use included cigarettes. She has a 3.50 pack-year smoking history. She has never used smokeless tobacco. She reports that she drinks alcohol. She reports that she does not use drugs.  Allergies:  Allergies  Allergen Reactions  . Latex Rash    No medications prior to admission.    Blood pressure (!) 105/92, pulse (!) 111, temperature 99.4 F (37.4 C), temperature source Oral, resp. rate 20, height 5' 10" (1.778 m), weight 103.1 kg, last menstrual period 01/02/2018, SpO2 97 %, unknown if currently breastfeeding. Physical Exam: Physical Exam  Constitutional: She appears well-developed and well-nourished. No distress.  HENT:  Head: Normocephalic and atraumatic.  Right Ear: External ear normal.  Left Ear: External ear normal.  Nose: Nose normal.  Eyes: Pupils are equal, round, and reactive to light. EOM are normal. Right eye exhibits no discharge. Left eye exhibits no discharge.  Neck: Normal range of motion. Neck supple. No JVD present. No tracheal deviation present.  Cardiovascular: Regular rhythm, normal heart sounds and intact distal pulses.  Tachycardic  Pulmonary/Chest: Effort normal and breath sounds  normal. No stridor. No respiratory distress. She has no wheezes. She has no rales.  Abdominal: Soft. She exhibits distension. She exhibits no mass. There is tenderness. There is no rebound and no guarding.   Musculoskeletal: Normal range of motion. She exhibits no edema or deformity.  Skin: Skin is warm and dry. She is not diaphoretic.   Tenderness, erythema, and warmth of bilateral lower back   Results for orders placed or performed during the hospital encounter of 01/18/18 (from the past 48 hour(s))  Comprehensive metabolic panel     Status: Abnormal   Collection Time: 01/18/18 11:40 PM  Result Value Ref Range   Sodium 131 (L) 135 - 145 mmol/L   Potassium 3.9 3.5 - 5.1 mmol/L   Chloride 99 98 - 111 mmol/L   CO2 22 22 - 32 mmol/L   Glucose, Bld 121 (H) 70 - 99 mg/dL   BUN 5 (L) 6 - 20 mg/dL   Creatinine, Ser 0.74 0.44 - 1.00 mg/dL   Calcium 8.3 (L) 8.9 - 10.3 mg/dL   Total Protein 6.3 (L) 6.5 - 8.1 g/dL   Albumin 2.5 (L) 3.5 - 5.0 g/dL   AST 27 15 - 41 U/L   ALT 25 0 - 44 U/L   Alkaline Phosphatase 63 38 - 126 U/L   Total Bilirubin 0.5 0.3 - 1.2 mg/dL   GFR calc non Af Amer >60 >60 mL/min   GFR calc Af Amer >60 >60 mL/min    Comment: (NOTE) The eGFR has been calculated using the CKD EPI equation. This calculation has not been validated in all clinical situations. eGFR's persistently <60 mL/min signify possible Chronic Kidney Disease.    Anion gap 10 5 - 15    Comment: Performed at [facility] [address] [place], [place] [address]  CBC with Differential     Status: Abnormal   Collection Time: 01/18/18 11:40 PM  Result Value Ref Range   WBC 15.1 (H) 4.0 - 10.5 K/uL   RBC 3.82 (L) 3.87 - 5.11 MIL/uL   Hemoglobin 10.2 (L) 12.0 - 15.0 g/dL   HCT 33.5 (L) 36.0 - 46.0 %   MCV 87.7 78.0 - 100.0 fL   MCH 26.7 26.0 - 34.0 pg   MCHC 30.4 30.0 - 36.0 g/dL   RDW 20.0 (H) 11.5 - 15.5 %   Platelets 402 (H) 150 - 400 K/uL   Neutrophils Relative % 74 %   Neutro Abs 11.1 (H) 1.7 - 7.7 K/uL   Lymphocytes Relative 14 %   Lymphs Abs 2.1 0.7 - 4.0 K/uL   Monocytes Relative 11 %   Monocytes Absolute 1.7 (H) 0.1 - 1.0 K/uL   Eosinophils Relative 1 %   Eosinophils Absolute 0.1  0.0 - 0.7 K/uL   Basophils Relative 1 %   Basophils Absolute 0.1 0.0 - 0.1 K/uL   Immature Granulocytes 1 %   Abs Immature Granulocytes 0.1 0.0 - 0.1 K/uL    Comment: Performed at [facility], [place] [address], [place], [place] [address]  Protime-INR     Status: None   Collection Time: 01/18/18 11:40 PM  Result Value Ref Range   Prothrombin Time 13.4 11.4 - 15.2 seconds   INR 1.03     Comment: Performed at [facility] [address], [place], [place] [address]  I-stat troponin, ED     Status: None   Collection Time: 01/18/18 11:45 PM  Result Value Ref Range   Troponin i,  poc 0.01 0.00 - 0.08 ng/mL   Comment 3            Comment: Due to the release kinetics of cTnI, a negative result within the first hours of the onset of symptoms does not rule out myocardial infarction with certainty. If myocardial infarction is still suspected, repeat the test at appropriate intervals.   I-Stat CG4 Lactic Acid, ED     Status: None   Collection Time: 01/18/18 11:47 PM  Result Value Ref Range   Lactic Acid, Venous 1.09 0.5 - 1.9 mmol/L  I-Stat beta hCG blood, ED     Status: None   Collection Time: 01/19/18  1:08 AM  Result Value Ref Range   I-stat hCG, quantitative <5.0 <5 mIU/mL   Comment 3            Comment:   GEST. AGE      CONC.  (mIU/mL)   <=1 WEEK        5 - 50     2 WEEKS       50 - 500     3 WEEKS       100 - 10,000     4 WEEKS     1,000 - 30,000        FEMALE AND NON-PREGNANT FEMALE:     LESS THAN 5 mIU/mL   I-Stat CG4 Lactic Acid, ED     Status: None   Collection Time: 01/19/18  1:21 AM  Result Value Ref Range   Lactic Acid, Venous 1.20 0.5 - 1.9 mmol/L  Urinalysis, Routine w reflex microscopic     Status: Abnormal   Collection Time: 01/19/18  1:45 AM  Result Value Ref Range   Color, Urine AMBER (A) YELLOW    Comment: BIOCHEMICALS MAY BE AFFECTED BY COLOR   APPearance HAZY (A) CLEAR   Specific Gravity, Urine 1.019 1.005 - 1.030   pH 7.0 5.0 - 8.0   Glucose,  UA NEGATIVE NEGATIVE mg/dL   Hgb urine dipstick NEGATIVE NEGATIVE   Bilirubin Urine NEGATIVE NEGATIVE   Ketones, ur NEGATIVE NEGATIVE mg/dL   Protein, ur 30 (A) NEGATIVE mg/dL   Nitrite NEGATIVE NEGATIVE   Leukocytes, UA NEGATIVE NEGATIVE   RBC / HPF 0-5 0 - 5 RBC/hpf   WBC, UA 0-5 0 - 5 WBC/hpf   Bacteria, UA RARE (A) NONE SEEN   Squamous Epithelial / LPF 0-5 0 - 5    Comment: Performed at [facility], [address], [place], [place] [address]  Lactic acid, plasma     Status: None   Collection Time: 01/19/18  5:32 AM  Result Value Ref Range   Lactic Acid, Venous 0.8 0.5 - 1.9 mmol/L    Comment: Performed at [facility] [address], [place], [place] [address]  Procalcitonin     Status: None   Collection Time: 01/19/18  5:32 AM  Result Value Ref Range   Procalcitonin 0.13 ng/mL    Comment:        Interpretation: PCT (Procalcitonin) <= 0.5 ng/mL: Systemic infection (sepsis) is not likely. Local bacterial infection is possible. (NOTE)       Sepsis PCT Algorithm           Lower Respiratory Tract                                      Infection PCT  Algorithm    ----------------------------     ----------------------------         PCT < 0.25 ng/mL                PCT < 0.10 ng/mL         Strongly encourage             Strongly discourage   discontinuation of antibiotics    initiation of antibiotics    ----------------------------     -----------------------------       PCT 0.25 - 0.50 ng/mL            PCT 0.10 - 0.25 ng/mL               OR       >80% decrease in PCT            Discourage initiation of                                            antibiotics      Encourage discontinuation           of antibiotics    ----------------------------     -----------------------------         PCT >= 0.50 ng/mL              PCT 0.26 - 0.50 ng/mL               AND        <80% decrease in PCT             Encourage initiation of                                              antibiotics       Encourage continuation           of antibiotics    ----------------------------     -----------------------------        PCT >= 0.50 ng/mL                  PCT > 0.50 ng/mL               AND         increase in PCT                  Strongly encourage                                      initiation of antibiotics    Strongly encourage escalation           of antibiotics                                     -----------------------------                                           PCT <= 0.25 ng/mL  OR                                        > 80% decrease in PCT                                     Discontinue / Do not initiate                                             antibiotics Performed at [facility], [place] [address] [place], [place] [address]   Protime-INR     Status: None   Collection Time: 01/19/18  5:32 AM  Result Value Ref Range   Prothrombin Time 13.7 11.4 - 15.2 seconds   INR 1.06     Comment: Performed at [facility], [place] [address] [place], [place] [address]  APTT     Status: None   Collection Time: 01/19/18  5:32 AM  Result Value Ref Range   aPTT 35 24 - 36 seconds    Comment: Performed at [facility] [address] [place], [place] [address]  Type and screen [place]     Status: None   Collection Time: 01/19/18  5:32 AM  Result Value Ref Range   ABO/RH(D) A POS    Antibody Screen NEG    Sample Expiration      01/22/2018 Performed at [facility], [place] [address] [place], [place] [address]   TSH     Status: None   Collection Time: 01/19/18  5:32 AM  Result Value Ref Range   TSH 4.473 0.350 - 4.500 uIU/mL    Comment: Performed by a 3rd Generation assay with a functional sensitivity of <=0.01 uIU/mL. Performed at [facility], [place] [address], [place], [place] [address]   CBC with Differential     Status: Abnormal   Collection Time: 01/19/18   5:32 AM  Result Value Ref Range   WBC 17.9 (H) 4.0 - 10.5 K/uL   RBC 3.79 (L) 3.87 - 5.11 MIL/uL   Hemoglobin 10.3 (L) 12.0 - 15.0 g/dL   HCT 33.4 (L) 36.0 - 46.0 %   MCV 88.1 78.0 - 100.0 fL   MCH 27.2 26.0 - 34.0 pg   MCHC 30.8 30.0 - 36.0 g/dL   RDW 20.2 (H) 11.5 - 15.5 %   Platelets 374 150 - 400 K/uL   Neutrophils Relative % 76 %   Neutro Abs 13.5 (H) 1.7 - 7.7 K/uL   Lymphocytes Relative 12 %   Lymphs Abs 2.2 0.7 - 4.0 K/uL   Monocytes Relative 12 %   Monocytes Absolute 2.1 (H) 0.1 - 1.0 K/uL   Eosinophils Relative 0 %   Eosinophils Absolute 0.1 0.0 - 0.7 K/uL   Basophils Relative 0 %   Basophils Absolute 0.1 0.0 - 0.1 K/uL   Immature Granulocytes 0 %   Abs Immature Granulocytes 0.1 0.0 - 0.1 K/uL    Comment: Performed at [facility], [place] [address], [place], [place] [address]  Comprehensive metabolic panel     Status: Abnormal   Collection Time: 01/19/18  5:32 AM  Result Value Ref  Range   Sodium 134 (L) 135 - 145 mmol/L   Potassium 4.0 3.5 - 5.1 mmol/L   Chloride 101 98 - 111 mmol/L   CO2 24 22 - 32 mmol/L   Glucose, Bld 121 (H) 70 - 99 mg/dL   BUN <5 (L) 6 - 20 mg/dL   Creatinine, Ser 0.78 0.44 - 1.00 mg/dL   Calcium 8.5 (L) 8.9 - 10.3 mg/dL   Total Protein 6.2 (L) 6.5 - 8.1 g/dL   Albumin 2.3 (L) 3.5 - 5.0 g/dL   AST 33 15 - 41 U/L   ALT 28 0 - 44 U/L   Alkaline Phosphatase 75 38 - 126 U/L   Total Bilirubin 0.3 0.3 - 1.2 mg/dL   GFR calc non Af Amer >60 >60 mL/min   GFR calc Af Amer >60 >60 mL/min    Comment: (NOTE) The eGFR has been calculated using the CKD EPI equation. This calculation has not been validated in all clinical situations. eGFR's persistently <60 mL/min signify possible Chronic Kidney Disease.    Anion gap 9 5 - 15    Comment: Performed at [facility] [address] [place], [place] [address]  Lactic acid, plasma     Status: None   Collection Time: 01/19/18  7:14 AM  Result Value Ref Range   Lactic Acid, Venous 0.8 0.5 -  1.9 mmol/L    Comment: Performed at [facility] [address] [place], [place] [address]   Ct Abdomen Pelvis W Contrast  Result Date: 01/19/2018 CLINICAL DATA:  History of recent cosmetic surgery in [place] with pain and tachycardia as well as fevers EXAM: CT ABDOMEN AND PELVIS WITH CONTRAST TECHNIQUE: Multidetector CT imaging of the abdomen and pelvis was performed using the standard protocol following bolus administration of intravenous contrast. CONTRAST:  185m OMNIPAQUE IOHEXOL 300 MG/ML  SOLN COMPARISON:  None. FINDINGS: Lower chest: Lung bases demonstrate mild bibasilar atelectasis left greater than right. No sizable effusion is seen. There are changes consistent with bilateral breast augmentation consistent with the given clinical history. Hepatobiliary: No focal liver abnormality is seen. No gallstones, gallbladder wall thickening, or biliary dilatation. Pancreas: Unremarkable. No pancreatic ductal dilatation or surrounding inflammatory changes. Spleen: Normal in size without focal abnormality. Adrenals/Urinary Tract: Adrenal glands are unremarkable. Kidneys are normal, without renal calculi, focal lesion, or hydronephrosis. Bladder is unremarkable. Stomach/Bowel: The appendix is not well visualized. No inflammatory changes to suggest appendicitis are noted. No obstructive or inflammatory changes are seen. Vascular/Lymphatic: No significant vascular findings are present. No enlarged abdominal or pelvic lymph nodes. Reproductive: Uterus and bilateral adnexa are unremarkable. Other: There are diffuse inflammatory changes identified in the subcutaneous tissues in the area of recent surgical intervention. In the anterior left lower abdominal wall, there is a fluid collection which measures approximately 1.8 by 7.8 cm in greatest AP and transverse dimension. It extends for approximately 12 cm in the craniocaudad projection. This is consistent with postoperative seroma. Additionally in the  posterior abdominal wall just above the iliac crests there is a 15 by 2.5 cm fluid collection consistent with postoperative seroma. This extends for at least 9 cm in craniocaudad projection. Diffuse subcutaneous edema is noted throughout the surgical beds. Smaller irregular fluid collections are noted in the buttock regions bilaterally. Musculoskeletal: No acute or significant osseous findings. IMPRESSION: There are postoperative changes consistent with the patient's given clinical history of breast augmentation, liposuction and tummy tuck. There is significant subcutaneous edema identified as well as multiple  fluid collections consistent with postoperative seromas. There is likely some degree of underlying superinfection given the patient's clinical history. Electronically Signed   By: Inez Catalina M.D.   On: 01/19/2018 02:59   Dg Chest Portable 1 View  Result Date: 01/19/2018 CLINICAL DATA:  Weight reduction surgeries. Now septic and short of breath. EXAM: PORTABLE CHEST 1 VIEW COMPARISON:  12/23/2015 FINDINGS: The heart size and mediastinal contours are within normal limits. Both lungs are clear. The visualized skeletal structures are unremarkable. IMPRESSION: No active disease. Electronically Signed   By: Lucienne Capers M.D.   On: 01/19/2018 00:08   Assessment/Plan Suspect infected seromas  POD#18 S/P abdominoplasty, breast augmentation, liposuction of back/extremities in [place], [place] - fever, chills, sinus tachycardia, WBC 17,900  - CT Abd/Pelvis 8/14 - 1.8x7.8 cm fluid collection left lower abdominal wall, 15x2.5 cm fluid collection over patients back, above iliac crests. Surrounding inflammatory changes. - continue IV abx - recommend plastic surgery consultation for drainage of fluid collections in anterior abdominal wall and back.  - general surgery will sign off   Jill Alexanders, [facility] Surgery 01/19/2018, 12:38 PM Pager: [phone] Consults:  [phone] Mon-Fri 7:00 am-4:30 pm Sat-Sun 7:00 am-11:30 am

## 2018-01-19 NOTE — Progress Notes (Signed)
Plastic Surgery  See Op Note for details. Clinically back seroma infected, abdomen normal seroma fluid. Cultures obtained from each site.  Both areas packed. May remove both areas of packing in shower am 01/20/18 and do not need to repack, dry dressing to external wound alone.  Glenna FellowsBrinda Kyng Matlock, MD [facility] Plastic & Reconstructive Surgery [phone], pin [phone]

## 2018-01-19 NOTE — Consult Note (Signed)
Reason for Consult: fevers cellulitis seroma Referring Physician: Rudolpho Sevin MD Location: [place]-inpatient Date: 8.14.19  Erin Boyd is an 32 y.o. female.  HPI: Admitted last evening with increasing pain and fever onset. She is post operative from bilateral breast augmentation, liposuction thighs arms backs and abdominoplasty in [place] 7.27.19. Had one abdominal drain that was removed prior to retuning to country. CT scan with seromas noted abdomen and back.  Past Medical History:  Diagnosis Date  . Cervical abnormality affecting pregnancy, antepartum 08/25/2015  . DVT (deep vein thrombosis) in pregnancy [facility])    dx clot in right arm in Jan 2017.  Been off Lovenox since Friday 08/23/15   . Headache    otc med prn  . Heartburn in pregnancy   . No pertinent past medical history   . Short cervix with cervical cerclage in second trimester, antepartum 09/06/2015    Past Surgical History:  Procedure Laterality Date  . CERVICAL CERCLAGE N/A 09/06/2015   Procedure: CERCLAGE CERVICAL;  Surgeon: Janyth Contes, MD;  Location: [facility];  Service: Gynecology;  Laterality: N/A;  . CESAREAN SECTION N/A 11/06/2015   Procedure: CESAREAN SECTION;  Surgeon: Paula Compton, MD;  Location: [facility];  Service: Obstetrics;  Laterality: N/A;  . INDUCED ABORTION    . pediatric dental surgery    . WISDOM TOOTH EXTRACTION      Family History  Problem Relation Age of Onset  . Clotting disorder Neg Hx     Social History:  reports that she quit smoking about 3 years ago. Her smoking use included cigarettes. She has a 3.50 pack-year smoking history. She has never used smokeless tobacco. She reports that she drinks alcohol. She reports that she does not use drugs.  Allergies:  Allergies  Allergen Reactions  . Latex Rash    Medications: I have reviewed the patient's current medications.  Results for orders placed or performed during the hospital encounter of 01/18/18 (from  the past 48 hour(s))  Comprehensive metabolic panel     Status: Abnormal   Collection Time: 01/18/18 11:40 PM  Result Value Ref Range   Sodium 131 (L) 135 - 145 mmol/L   Potassium 3.9 3.5 - 5.1 mmol/L   Chloride 99 98 - 111 mmol/L   CO2 22 22 - 32 mmol/L   Glucose, Bld 121 (H) 70 - 99 mg/dL   BUN 5 (L) 6 - 20 mg/dL   Creatinine, Ser 0.74 0.44 - 1.00 mg/dL   Calcium 8.3 (L) 8.9 - 10.3 mg/dL   Total Protein 6.3 (L) 6.5 - 8.1 g/dL   Albumin 2.5 (L) 3.5 - 5.0 g/dL   AST 27 15 - 41 U/L   ALT 25 0 - 44 U/L   Alkaline Phosphatase 63 38 - 126 U/L   Total Bilirubin 0.5 0.3 - 1.2 mg/dL   GFR calc non Af Amer >60 >60 mL/min   GFR calc Af Amer >60 >60 mL/min    Comment: (NOTE) The eGFR has been calculated using the CKD EPI equation. This calculation has not been validated in all clinical situations. eGFR's persistently <60 mL/min signify possible Chronic Kidney Disease.    Anion gap 10 5 - 15    Comment: Performed at [facility] [address], [place], [place] [address]  CBC with Differential     Status: Abnormal   Collection Time: 01/18/18 11:40 PM  Result Value Ref Range   WBC 15.1 (H) 4.0 - 10.5 K/uL   RBC 3.82 (L)  3.87 - 5.11 MIL/uL   Hemoglobin 10.2 (L) 12.0 - 15.0 g/dL   HCT 33.5 (L) 36.0 - 46.0 %   MCV 87.7 78.0 - 100.0 fL   MCH 26.7 26.0 - 34.0 pg   MCHC 30.4 30.0 - 36.0 g/dL   RDW 20.0 (H) 11.5 - 15.5 %   Platelets 402 (H) 150 - 400 K/uL   Neutrophils Relative % 74 %   Neutro Abs 11.1 (H) 1.7 - 7.7 K/uL   Lymphocytes Relative 14 %   Lymphs Abs 2.1 0.7 - 4.0 K/uL   Monocytes Relative 11 %   Monocytes Absolute 1.7 (H) 0.1 - 1.0 K/uL   Eosinophils Relative 1 %   Eosinophils Absolute 0.1 0.0 - 0.7 K/uL   Basophils Relative 1 %   Basophils Absolute 0.1 0.0 - 0.1 K/uL   Immature Granulocytes 1 %   Abs Immature Granulocytes 0.1 0.0 - 0.1 K/uL    Comment: Performed at [facility] [address], [place], [place] [address]  Protime-INR     Status: None    Collection Time: 01/18/18 11:40 PM  Result Value Ref Range   Prothrombin Time 13.4 11.4 - 15.2 seconds   INR 1.03     Comment: Performed at [facility] [address], [place], [place] [address]  I-stat troponin, ED     Status: None   Collection Time: 01/18/18 11:45 PM  Result Value Ref Range   Troponin i, poc 0.01 0.00 - 0.08 ng/mL   Comment 3            Comment: Due to the release kinetics of cTnI, a negative result within the first hours of the onset of symptoms does not rule out myocardial infarction with certainty. If myocardial infarction is still suspected, repeat the test at appropriate intervals.   I-Stat CG4 Lactic Acid, ED     Status: None   Collection Time: 01/18/18 11:47 PM  Result Value Ref Range   Lactic Acid, Venous 1.09 0.5 - 1.9 mmol/L  I-Stat beta hCG blood, ED     Status: None   Collection Time: 01/19/18  1:08 AM  Result Value Ref Range   I-stat hCG, quantitative <5.0 <5 mIU/mL   Comment 3            Comment:   GEST. AGE      CONC.  (mIU/mL)   <=1 WEEK        5 - 50     2 WEEKS       50 - 500     3 WEEKS       100 - 10,000     4 WEEKS     1,000 - 30,000        FEMALE AND NON-PREGNANT FEMALE:     LESS THAN 5 mIU/mL   I-Stat CG4 Lactic Acid, ED     Status: None   Collection Time: 01/19/18  1:21 AM  Result Value Ref Range   Lactic Acid, Venous 1.20 0.5 - 1.9 mmol/L  Urinalysis, Routine w reflex microscopic     Status: Abnormal   Collection Time: 01/19/18  1:45 AM  Result Value Ref Range   Color, Urine AMBER (A) YELLOW    Comment: BIOCHEMICALS MAY BE AFFECTED BY COLOR   APPearance HAZY (A) CLEAR   Specific Gravity, Urine 1.019 1.005 - 1.030   pH 7.0 5.0 - 8.0   Glucose, UA NEGATIVE NEGATIVE mg/dL   Hgb urine dipstick NEGATIVE NEGATIVE  Bilirubin Urine NEGATIVE NEGATIVE   Ketones, ur NEGATIVE NEGATIVE mg/dL   Protein, ur 30 (A) NEGATIVE mg/dL   Nitrite NEGATIVE NEGATIVE   Leukocytes, UA NEGATIVE NEGATIVE   RBC / HPF 0-5 0 - 5 RBC/hpf   WBC,  UA 0-5 0 - 5 WBC/hpf   Bacteria, UA RARE (A) NONE SEEN   Squamous Epithelial / LPF 0-5 0 - 5    Comment: Performed at [facility], [place] [address] [place], [place] [address]  Lactic acid, plasma     Status: None   Collection Time: 01/19/18  5:32 AM  Result Value Ref Range   Lactic Acid, Venous 0.8 0.5 - 1.9 mmol/L    Comment: Performed at [facility] [address] [place], [place] [address]  Procalcitonin     Status: None   Collection Time: 01/19/18  5:32 AM  Result Value Ref Range   Procalcitonin 0.13 ng/mL    Comment:        Interpretation: PCT (Procalcitonin) <= 0.5 ng/mL: Systemic infection (sepsis) is not likely. Local bacterial infection is possible. (NOTE)       Sepsis PCT Algorithm           Lower Respiratory Tract                                      Infection PCT Algorithm    ----------------------------     ----------------------------         PCT < 0.25 ng/mL                PCT < 0.10 ng/mL         Strongly encourage             Strongly discourage   discontinuation of antibiotics    initiation of antibiotics    ----------------------------     -----------------------------       PCT 0.25 - 0.50 ng/mL            PCT 0.10 - 0.25 ng/mL               OR       >80% decrease in PCT            Discourage initiation of                                            antibiotics      Encourage discontinuation           of antibiotics    ----------------------------     -----------------------------         PCT >= 0.50 ng/mL              PCT 0.26 - 0.50 ng/mL               AND        <80% decrease in PCT             Encourage initiation of                                             antibiotics       Encourage continuation  of antibiotics    ----------------------------     -----------------------------        PCT >= 0.50 ng/mL                  PCT > 0.50 ng/mL               AND         increase in PCT                  Strongly encourage                                       initiation of antibiotics    Strongly encourage escalation           of antibiotics                                     -----------------------------                                           PCT <= 0.25 ng/mL                                                 OR                                        > 80% decrease in PCT                                     Discontinue / Do not initiate                                             antibiotics Performed at [facility], [address] [place], [place] [address]   Protime-INR     Status: None   Collection Time: 01/19/18  5:32 AM  Result Value Ref Range   Prothrombin Time 13.7 11.4 - 15.2 seconds   INR 1.06     Comment: Performed at [facility], [place] [address] [place], [place] [address]  APTT     Status: None   Collection Time: 01/19/18  5:32 AM  Result Value Ref Range   aPTT 35 24 - 36 seconds    Comment: Performed at [facility] [address], [place], [place] [address]  HIV antibody (Routine Testing)     Status: None   Collection Time: 01/19/18  5:32 AM  Result Value Ref Range   HIV Screen 4th Generation wRfx Non Reactive Non Reactive    Comment: (NOTE) Performed At: [facility] [address] [place], [place] [id number] Rush Farmer MD TZ:[id number]   Type and screen [place]     Status: None   Collection Time: 01/19/18  5:32 AM  Result Value Ref Range   ABO/RH(D) A POS    Antibody Screen NEG    Sample Expiration      01/22/2018 Performed at [facility], [place] [address], [place], [place] [address]   TSH     Status: None   Collection Time: 01/19/18  5:32 AM  Result Value Ref Range   TSH 4.473 0.350 - 4.500 uIU/mL    Comment: Performed by a 3rd Generation assay with a functional sensitivity of <=0.01 uIU/mL. Performed at [facility], [place] [address] [place], [place] [address]   ABO/Rh     Status: None   Collection Time: 01/19/18   5:32 AM  Result Value Ref Range   ABO/RH(D)      A POS Performed at [facility] [address] [place], [place] [address]   CBC with Differential     Status: Abnormal   Collection Time: 01/19/18  5:32 AM  Result Value Ref Range   WBC 17.9 (H) 4.0 - 10.5 K/uL   RBC 3.79 (L) 3.87 - 5.11 MIL/uL   Hemoglobin 10.3 (L) 12.0 - 15.0 g/dL   HCT 33.4 (L) 36.0 - 46.0 %   MCV 88.1 78.0 - 100.0 fL   MCH 27.2 26.0 - 34.0 pg   MCHC 30.8 30.0 - 36.0 g/dL   RDW 20.2 (H) 11.5 - 15.5 %   Platelets 374 150 - 400 K/uL   Neutrophils Relative % 76 %   Neutro Abs 13.5 (H) 1.7 - 7.7 K/uL   Lymphocytes Relative 12 %   Lymphs Abs 2.2 0.7 - 4.0 K/uL   Monocytes Relative 12 %   Monocytes Absolute 2.1 (H) 0.1 - 1.0 K/uL   Eosinophils Relative 0 %   Eosinophils Absolute 0.1 0.0 - 0.7 K/uL   Basophils Relative 0 %   Basophils Absolute 0.1 0.0 - 0.1 K/uL   Immature Granulocytes 0 %   Abs Immature Granulocytes 0.1 0.0 - 0.1 K/uL    Comment: Performed at [facility], [place] [address], [place], [place] [address]  Comprehensive metabolic panel     Status: Abnormal   Collection Time: 01/19/18  5:32 AM  Result Value Ref Range   Sodium 134 (L) 135 - 145 mmol/L   Potassium 4.0 3.5 - 5.1 mmol/L   Chloride 101 98 - 111 mmol/L   CO2 24 22 - 32 mmol/L   Glucose, Bld 121 (H) 70 - 99 mg/dL   BUN <5 (L) 6 - 20 mg/dL   Creatinine, Ser 0.78 0.44 - 1.00 mg/dL   Calcium 8.5 (L) 8.9 - 10.3 mg/dL   Total Protein 6.2 (L) 6.5 - 8.1 g/dL   Albumin 2.3 (L) 3.5 - 5.0 g/dL   AST 33 15 - 41 U/L   ALT 28 0 - 44 U/L   Alkaline Phosphatase 75 38 - 126 U/L   Total Bilirubin 0.3 0.3 - 1.2 mg/dL   GFR calc non Af Amer >60 >60 mL/min   GFR calc Af Amer >60 >60 mL/min    Comment: (NOTE) The eGFR has been calculated using the CKD EPI equation. This calculation has not been validated in all clinical situations. eGFR's persistently <60 mL/min signify possible Chronic Kidney Disease.    Anion gap 9 5 - 15    Comment:  Performed at [facility] [address] [place],  [address]  Lactic acid, plasma     Status: None   Collection Time: 01/19/18  7:14  AM  Result Value Ref Range   Lactic Acid, Venous 0.8 0.5 - 1.9 mmol/L    Comment: Performed at [facility] [address] [place], [place] [address]    Ct Abdomen Pelvis W Contrast  Result Date: 01/19/2018 CLINICAL DATA:  History of recent cosmetic surgery in [place] with pain and tachycardia as well as fevers EXAM: CT ABDOMEN AND PELVIS WITH CONTRAST TECHNIQUE: Multidetector CT imaging of the abdomen and pelvis was performed using the standard protocol following bolus administration of intravenous contrast. CONTRAST:  123m OMNIPAQUE IOHEXOL 300 MG/ML  SOLN COMPARISON:  None. FINDINGS: Lower chest: Lung bases demonstrate mild bibasilar atelectasis left greater than right. No sizable effusion is seen. There are changes consistent with bilateral breast augmentation consistent with the given clinical history. Hepatobiliary: No focal liver abnormality is seen. No gallstones, gallbladder wall thickening, or biliary dilatation. Pancreas: Unremarkable. No pancreatic ductal dilatation or surrounding inflammatory changes. Spleen: Normal in size without focal abnormality. Adrenals/Urinary Tract: Adrenal glands are unremarkable. Kidneys are normal, without renal calculi, focal lesion, or hydronephrosis. Bladder is unremarkable. Stomach/Bowel: The appendix is not well visualized. No inflammatory changes to suggest appendicitis are noted. No obstructive or inflammatory changes are seen. Vascular/Lymphatic: No significant vascular findings are present. No enlarged abdominal or pelvic lymph nodes. Reproductive: Uterus and bilateral adnexa are unremarkable. Other: There are diffuse inflammatory changes identified in the subcutaneous tissues in the area of recent surgical intervention. In the anterior left lower abdominal wall, there is a fluid collection which  measures approximately 1.8 by 7.8 cm in greatest AP and transverse dimension. It extends for approximately 12 cm in the craniocaudad projection. This is consistent with postoperative seroma. Additionally in the posterior abdominal wall just above the iliac crests there is a 15 by 2.5 cm fluid collection consistent with postoperative seroma. This extends for at least 9 cm in craniocaudad projection. Diffuse subcutaneous edema is noted throughout the surgical beds. Smaller irregular fluid collections are noted in the buttock regions bilaterally. Musculoskeletal: No acute or significant osseous findings. IMPRESSION: There are postoperative changes consistent with the patient's given clinical history of breast augmentation, liposuction and tummy tuck. There is significant subcutaneous edema identified as well as multiple fluid collections consistent with postoperative seromas. There is likely some degree of underlying superinfection given the patient's clinical history. Electronically Signed   By: MInez CatalinaM.D.   On: 01/19/2018 02:59   Dg Chest Portable 1 View  Result Date: 01/19/2018 CLINICAL DATA:  Weight reduction surgeries. Now septic and short of breath. EXAM: PORTABLE CHEST 1 VIEW COMPARISON:  12/23/2015 FINDINGS: The heart size and mediastinal contours are within normal limits. Both lungs are clear. The visualized skeletal structures are unremarkable. IMPRESSION: No active disease. Electronically Signed   By: WLucienne CapersM.D.   On: 01/19/2018 00:08    ROS Blood pressure 123/68, pulse (!) 122, temperature (!) 101.9 F (38.8 C), temperature source Oral, resp. rate 20, height _0  (1.778 m), weight 103.1 kg, last menstrual period 01/02/2018, SpO2 98 %, unknown if currently breastfeeding. Physical Exam  Gen: alert, in pain  CV: normal heart sounds PULM: clear to ausculation Breasts: removed tapes except around NAC, incisions intact NAC viable no erythema Arms and medial thighs benign Back  with cellulitis and TTP over left lower back Abd: removed tapes over incision, left side of incision with maceration and some drainage, bilateral lower abdomen with induration  Assessment/Plan: At least two large seroma cavities back and abdomen. Plan incision and  drainage. Reviewed will try to utilize her prior scars however may require additional or longer scars to address infection. Counseled this may absolutely affect her cosmetic outcome long term.  Prior DVT history and this would be considered higher risk surgery- recommend Lovenox to start post op.  Mother at bedside and questions answered. Patient has permanent piercing left cheek that cannot be removed without surgery. Reviewed this may be risk burn during surgery.  Irene Limbo, MD [facility] [phone], pin [phone]

## 2018-01-19 NOTE — ED Notes (Signed)
Pt having difficulty turning d/t surgical pain, purewick applied.

## 2018-01-19 NOTE — Progress Notes (Signed)
Pharmacy Antibiotic Note  Erin Boyd is a 32 y.o. female admitted on 01/18/2018 with sepsis.  Pharmacy has been consulted for Vancomycin/Zosyn dosing. WBC elevated at 15.1. Renal function. [place].   Plan: Vancomycin 750 mg IV q8h Zosyn 3.375G IV q8h to be infused over 4 hours Trend WBC, temp, renal function  F/U infectious work-up Drug levels as indicated   Height: 5\' 10"  (177.8 cm) Weight: 180 lb (81.6 kg) IBW/kg (Calculated) : 68.5  Temp (24hrs), Avg:101.4 F (38.6 C), Min:98.9 F (37.2 C), Max:103.1 F (39.5 C)  Recent Labs  Lab 01/18/18 2340 01/18/18 2347 01/19/18 0121  WBC 15.1*  --   --   CREATININE 0.74  --   --   LATICACIDVEN  --  1.09 1.20    Estimated Creatinine Clearance: 111.2 mL/min (by C-G formula based on SCr of 0.74 mg/dL).    Allergies  Allergen Reactions  . Latex Rash   Abran DukeLedford, Keriana Sarsfield 01/19/2018 4:54 AM

## 2018-01-19 NOTE — Progress Notes (Addendum)
PROGRESS NOTE    Erin Boyd  ZOX:[id number] DOB: 08/08/1985 DOA: 01/18/2018 PCP: Patient, No Pcp Per   Brief Narrative: Erin Boyd is a 32 y.o. female with history of iron deficiency anemia presents to the ER with complaint of increasing pain in the lower abdomen and back over the last 2 days with not feeling well.  Also has been having subjective feeling of fever and chills.  Denies any nausea vomiting diarrhea productive cough or chest pain.  Patient had liposuction breast augmentation and tummy tuck done in [place] on July 27 2 weeks ago and had just come back to [place] last week.  In addition to the pain patient also noticed some mild discharge from the surgical site.  ED Course: In the ER CT abdomen pelvis done shows seroma with possible infection at the surgical site in the lower abdomen.  Patient on admission and is tachycardic with heart rate around 130s with temperature of 103 F and leukocytosis.  ER physician discussed with on-call general surgeon who advised antibiotics and possible need for plastic surgery consult.  Assessment & Plan:   Principal Problem:   Sepsis (HCC) Active Problems:   Wolff-Parkinson-White (WPW) pattern   Iron deficiency anemia due to chronic blood loss  1-Sepsis; presents with fever, tachycardia, leukocystosis. Source post surgery infection.  Continue with IV fluids, IV antibiotics.   2-Infection abdomen and back, post surgery liposuction and tummy tuck,  Had surgery out side of [place].  IV antibiotics.  Surgery and plastic surgery consulted.  Will get CT spine, back, patient with severe pain and fluid collection in her back, flank.   3-history of WPW; EKG; reviewed EKG with cardiology on called. Patient PR is not short. He recommend follow EKG in am.       DVT prophylaxis:SCD Code Status:full code.  Family Communication: care discussed with patient.  Disposition Plan: remain inpatient.   Consultants:    Sx   Procedures:   To OR today   Antimicrobials:  Vancomycin and zosyn.    Subjective: She is complaining of severe back pain, and abdominal pain, site of recent sx.    Objective: Vitals:   01/19/18 0436 01/19/18 0530 01/19/18 1207 01/19/18 1546  BP:  110/69 (!) 105/92 123/68  Pulse:  (!) 106 (!) 111 (!) 122  Resp:  18 20 20   Temp: 98.9 F (37.2 C) 98.2 F (36.8 C) 99.4 F (37.4 C) (!) 101.9 F (38.8 C)  TempSrc: Oral Oral Oral Oral  SpO2:  96% 97% 98%  Weight:  103.1 kg    Height:  5\' 10"  (1.778 m)      Intake/Output Summary (Last 24 hours) at 01/19/2018 1556 Last data filed at 01/19/2018 1415 Gross per 24 hour  Intake 300 ml  Output -  Net 300 ml   Filed Weights   01/18/18 2326 01/19/18 0530  Weight: 81.6 kg 103.1 kg    Examination:  General exam: Appears calm and comfortable  Respiratory system: Clear to auscultation. Respiratory effort normal. Cardiovascular system: S1 & S2 heard, RRR. No JVD, murmurs, rubs, gallops or clicks. No pedal edema. Gastrointestinal system: Abdomen is nondistended, soft, tenderness left side and left flank area.  Central nervous system: Alert and oriented. No focal neurological deficits. Extremities: Symmetric 5 x 5 power. Skin: No rashes, lesions or ulcers Psychiatry: Judgement and insight appear normal. Mood & affect appropriate.     Data Reviewed: I have personally reviewed following labs and imaging studies  CBC: Recent  Labs  Lab 01/18/18 2340 01/19/18 0532  WBC 15.1* 17.9*  NEUTROABS 11.1* 13.5*  HGB 10.2* 10.3*  HCT 33.5* 33.4*  MCV 87.7 88.1  PLT 402* 374   Basic Metabolic Panel: Recent Labs  Lab 01/18/18 2340 01/19/18 0532  NA 131* 134*  K 3.9 4.0  CL 99 101  CO2 22 24  GLUCOSE 121* 121*  BUN 5* <5*  CREATININE 0.74 0.78  CALCIUM 8.3* 8.5*   GFR: Estimated Creatinine Clearance: 133.6 mL/min (by C-G formula based on SCr of 0.78 mg/dL). Liver Function Tests: Recent Labs  Lab  01/18/18 2340 01/19/18 0532  AST 27 33  ALT 25 28  ALKPHOS 63 75  BILITOT 0.5 0.3  PROT 6.3* 6.2*  ALBUMIN 2.5* 2.3*   No results for input(s): LIPASE, AMYLASE in the last 168 hours. No results for input(s): AMMONIA in the last 168 hours. Coagulation Profile: Recent Labs  Lab 01/18/18 2340 01/19/18 0532  INR 1.03 1.06   Cardiac Enzymes: No results for input(s): CKTOTAL, CKMB, CKMBINDEX, TROPONINI in the last 168 hours. BNP (last 3 results) No results for input(s): PROBNP in the last 8760 hours. HbA1C: No results for input(s): HGBA1C in the last 72 hours. CBG: No results for input(s): GLUCAP in the last 168 hours. Lipid Profile: No results for input(s): CHOL, HDL, LDLCALC, TRIG, CHOLHDL, LDLDIRECT in the last 72 hours. Thyroid Function Tests: Recent Labs    01/19/18 0532  TSH 4.473   Anemia Panel: No results for input(s): VITAMINB12, FOLATE, FERRITIN, TIBC, IRON, RETICCTPCT in the last 72 hours. Sepsis Labs: Recent Labs  Lab 01/18/18 2347 01/19/18 0121 01/19/18 0532 01/19/18 0714  PROCALCITON  --   --  0.13  --   LATICACIDVEN 1.09 1.20 0.8 0.8    No results found for this or any previous visit (from the past 240 hour(s)).       Radiology Studies: Ct Abdomen Pelvis W Contrast  Result Date: 01/19/2018 CLINICAL DATA:  History of [place] with pain and tachycardia as well as fevers EXAM: CT ABDOMEN AND PELVIS WITH CONTRAST TECHNIQUE: Multidetector CT imaging of the abdomen and pelvis was performed using the standard protocol following bolus administration of intravenous contrast. CONTRAST:  100mL OMNIPAQUE IOHEXOL 300 MG/ML  SOLN COMPARISON:  None. FINDINGS: Lower chest: Lung bases demonstrate mild bibasilar atelectasis left greater than right. No sizable effusion is seen. There are changes consistent with bilateral breast augmentation consistent with the given clinical history. Hepatobiliary: No focal liver abnormality is seen. No  gallstones, gallbladder wall thickening, or biliary dilatation. Pancreas: Unremarkable. No pancreatic ductal dilatation or surrounding inflammatory changes. Spleen: Normal in size without focal abnormality. Adrenals/Urinary Tract: Adrenal glands are unremarkable. Kidneys are normal, without renal calculi, focal lesion, or hydronephrosis. Bladder is unremarkable. Stomach/Bowel: The appendix is not well visualized. No inflammatory changes to suggest appendicitis are noted. No obstructive or inflammatory changes are seen. Vascular/Lymphatic: No significant vascular findings are present. No enlarged abdominal or pelvic lymph nodes. Reproductive: Uterus and bilateral adnexa are unremarkable. Other: There are diffuse inflammatory changes identified in the subcutaneous tissues in the area of recent surgical intervention. In the anterior left lower abdominal wall, there is a fluid collection which measures approximately 1.8 by 7.8 cm in greatest AP and transverse dimension. It extends for approximately 12 cm in the craniocaudad projection. This is consistent with postoperative seroma. Additionally in the posterior abdominal wall just above the iliac crests there is a 15 by 2.5 cm fluid collection consistent  with postoperative seroma. This extends for at least 9 cm in craniocaudad projection. Diffuse subcutaneous edema is noted throughout the surgical beds. Smaller irregular fluid collections are noted in the buttock regions bilaterally. Musculoskeletal: No acute or significant osseous findings. IMPRESSION: There are postoperative changes consistent with the patient's given clinical history of breast augmentation, liposuction and tummy tuck. There is significant subcutaneous edema identified as well as multiple fluid collections consistent with postoperative seromas. There is likely some degree of underlying superinfection given the patient's clinical history. Electronically Signed   By: Alcide Clever M.D.   On: 01/19/2018  02:59   Dg Chest Portable 1 View  Result Date: 01/19/2018 CLINICAL DATA:  Weight reduction surgeries. Now septic and short of breath. EXAM: PORTABLE CHEST 1 VIEW COMPARISON:  12/23/2015 FINDINGS: The heart size and mediastinal contours are within normal limits. Both lungs are clear. The visualized skeletal structures are unremarkable. IMPRESSION: No active disease. Electronically Signed   By: Burman Nieves M.D.   On: 01/19/2018 00:08        Scheduled Meds: Continuous Infusions: . sodium chloride    . piperacillin-tazobactam (ZOSYN)  IV 3.375 g (01/19/18 1009)  . vancomycin 750 mg (01/19/18 1554)     LOS: 0 days    Time spent: 35 minutes.     Alba Cory, MD [facility] Pager [phone]  If 7PM-7AM, please contact night-coverage [email or web address] Password [facility] 01/19/2018, 3:56 PM

## 2018-01-20 ENCOUNTER — Encounter (HOSPITAL_COMMUNITY): Payer: Self-pay | Admitting: Plastic Surgery

## 2018-01-20 DIAGNOSIS — R55 Syncope and collapse: Secondary | ICD-10-CM

## 2018-01-20 DIAGNOSIS — I456 Pre-excitation syndrome: Secondary | ICD-10-CM

## 2018-01-20 LAB — BASIC METABOLIC PANEL
ANION GAP: 9 (ref 5–15)
Anion gap: 10 (ref 5–15)
CALCIUM: 7.9 mg/dL — AB (ref 8.9–10.3)
CO2: 25 mmol/L (ref 22–32)
CO2: 22 mmol/L (ref 22–32)
CREATININE: 0.94 mg/dL (ref 0.44–1.00)
Calcium: 8.6 mg/dL — ABNORMAL LOW (ref 8.9–10.3)
Chloride: 101 mmol/L (ref 98–111)
Chloride: 105 mmol/L (ref 98–111)
Creatinine, Ser: 0.82 mg/dL (ref 0.44–1.00)
GFR calc Af Amer: >60 mL/min (ref >60)
GFR calc Af Amer: >60 mL/min (ref >60)
GLUCOSE: 111 mg/dL — AB (ref 70–99)
GLUCOSE: 119 mg/dL — AB (ref 70–99)
POTASSIUM: 3.5 mmol/L (ref 3.5–5.1)
Potassium: 3.5 mmol/L (ref 3.5–5.1)
SODIUM: 136 mmol/L (ref 135–145)
Sodium: 136 mmol/L (ref 135–145)

## 2018-01-20 LAB — CBC
HCT: 32.3 % — ABNORMAL LOW (ref 36.0–46.0)
HCT: 29.8 % — ABNORMAL LOW (ref 36.0–46.0)
HEMOGLOBIN: 9.1 g/dL — AB (ref 12.0–15.0)
Hemoglobin: 9.8 g/dL — ABNORMAL LOW (ref 12.0–15.0)
MCH: 26.7 pg (ref 26.0–34.0)
MCH: 27 pg (ref 26.0–34.0)
MCHC: 30.3 g/dL (ref 30.0–36.0)
MCHC: 30.5 g/dL (ref 30.0–36.0)
MCV: 88 fL (ref 78.0–100.0)
MCV: 88.4 fL (ref 78.0–100.0)
PLATELETS: 370 10*3/uL (ref 150–400)
Platelets: 341 10*3/uL (ref 150–400)
RBC: 3.67 MIL/uL — ABNORMAL LOW (ref 3.87–5.11)
RBC: 3.37 MIL/uL — AB (ref 3.87–5.11)
RDW: 19.9 % — ABNORMAL HIGH (ref 11.5–15.5)
RDW: 19.8 % — ABNORMAL HIGH (ref 11.5–15.5)
WBC: 18 10*3/uL — ABNORMAL HIGH (ref 4.0–10.5)
WBC: 18.3 10*3/uL — ABNORMAL HIGH (ref 4.0–10.5)

## 2018-01-20 LAB — D-DIMER, QUANTITATIVE (NOT AT ARMC): D DIMER QUANT: 2.45 ug{FEU}/mL — AB (ref 0.00–0.50)

## 2018-01-20 LAB — GLUCOSE, CAPILLARY: GLUCOSE-CAPILLARY: 123 mg/dL — AB (ref 70–99)

## 2018-01-20 LAB — TROPONIN I: Troponin I: 0.27 ng/mL (ref <0.03)

## 2018-01-20 MED ORDER — SODIUM CHLORIDE 0.9 % IV SOLN
INTRAVENOUS | Status: DC
  Administered 2018-01-20: 23:00:00 via INTRAVENOUS

## 2018-01-20 MED ORDER — SODIUM CHLORIDE 0.9 % IV SOLN
INTRAVENOUS | Status: DC
  Administered 2018-01-20: 100 mL/h via INTRAVENOUS
  Administered 2018-01-21 – 2018-01-22 (×3): via INTRAVENOUS

## 2018-01-20 MED ORDER — SODIUM CHLORIDE 0.9 % IV BOLUS
1000.0000 mL | Freq: Once | INTRAVENOUS | Status: AC
  Administered 2018-01-20: 1000 mL via INTRAVENOUS

## 2018-01-20 MED ORDER — POLYETHYLENE GLYCOL 3350 17 G PO PACK
17.0000 g | PACK | Freq: Every day | ORAL | Status: DC
  Administered 2018-01-20 – 2018-01-22 (×3): 17 g via ORAL
  Filled 2018-01-20 (×3): qty 1

## 2018-01-20 NOTE — Progress Notes (Signed)
Patient had syncopal episode around 12:15 while in shower and after I had removed her back dressing and packing per Surgeon's instructions this am.  I had removed dressing from abdominal wound and unable to get packing out from that wound.  Patient stated she didn't feel good and felt hot then head slumped.  She was [facility] to [facility] right outside shower.  2 other co-workers assisted me to get her our of bathroom so we could access her IV and start bolus of NS.  She was moved while sitting on top of BSC.  Rapid Response called and Dr. Sunnie Nielsenegalado and Dr. Leta Baptisthimmappa.  Both doctors to bedside.  Dr. Leta Baptisthimmappa removed gauze packing from abdominal wound and covered with dry gauze.  12 Lead EKG ordered and 1,000 ml bolus NS ordered and both done.  Patient alert after approximately 10 minutes and oriented.  Cardiiology consult ordered.

## 2018-01-20 NOTE — Progress Notes (Signed)
Plastic Surgery  POD#1 Incision and drainage abdomen and back seromas  Temp:  [97.7 F (36.5 C)-102.3 F (39.1 C)] 99.1 F (37.3 C) (08/15 0606) Pulse Rate:  [60-122] 116 (08/15 0451) Resp:  [11-20] 19 (08/15 0451) BP: (103-134)/(54-92) 103/54 (08/15 0451) SpO2:  [94 %-99 %] 96 % (08/15 0451) Weight:  [103.1 kg-105.5 kg] 105.5 kg (08/15 0451)   No issues overnight Culture back seroma with GPC, Culture abdomen no organisms   PE: sleeping arousable no complaints Dressings intact abdomen and back  A/P  Remove both areas of packing in shower this am and do not need to repack, dry dressing to external wound alone. Reviewed this with RN.  F/u cultures.  Home once fevers resolved, pain improved, appropriate oral antibiotics. For home, ok to shower, soap and water ok. Pat incisions dry. Dry dressing to abdomen and back wounds daily and PRN. F/u with me in office in week.   Glenna FellowsBrinda Kentley Cedillo, MD [facility] Plastic & Reconstructive Surgery [phone], pin [phone]

## 2018-01-20 NOTE — Progress Notes (Signed)
Both arms assessed with ultrasound by two IVRN for placement of 20ga PIV for CT scan.  Unable to find any suitable veins. Unable to start 20 ga.

## 2018-01-20 NOTE — Significant Event (Signed)
Rapid Response Event Note Syncopal episode  Overview: Time Called: 1210 Arrival Time: 1213 Event Type: Neurologic  Initial Focused Assessment: [facility] with the assistance of 3 RN's and a NT. Pt cool and clammy, not responding, able to open her eyes with sternal rub but quickly goes back out. Pt assisted to bed, requiring 5 staff members to get back in bed, placed in trendelenburg. Pt able to nod her head "no" when I attempted to put BP cuff on her upper arm. When she was finally alert and awake she endorsed pain to BUE and wanted BP's to be taken on bilateral lower arms due to recent surgery. Also reports new onset lower back at surgical site. alert and oriented x4 denies CP/SOB BP 125/74, HR 110, RR 16, 96% RA VS on arrival SBP 140, HR 94 after 500cc NS bolus  Interventions: 1 L NS bolus to follow with NS 100 cc/hr  CBC, results pending EKG Dressing change  Plan of Care (if not transferred): Continue to monitor, call RRT as needed. RN Lupita Leashonna made aware  Event Summary: Name of Physician Notified: Dr. Sunnie Nielsenegalado  at 1215  Name of Consulting Physician Notified: Dr. Leta Baptisthimmappa  at 1215  Outcome: Stayed in room and stabalized     Erin Boyd, Erin Boyd

## 2018-01-20 NOTE — Progress Notes (Signed)
CRITICAL VALUE ALERT  Critical Value:  Troponin = 0.27  Date & Time Notied:  01/20/18 @ 1448  Provider Notified: Dr. Sunnie Nielsenegalado  Orders Received/Actions taken: EKG, Echo, D-dimer, and continue with serial troponins

## 2018-01-20 NOTE — Care Management Note (Signed)
Case Management Note  Patient Details  Name: Mathews Robinsonsshley A Miley-Muhammad MRN: [id number] Date of Birth: 02/27/1986  Subjective/Objective:  Sepsis               Action/Plan: Patient lives at home with her 2 children; [facility]; has private insurance with Medicaid with prescription drug coverage; pharmacy of choice is [facility] on [address]; patient recently went to [place] for Cosmetic surgery- breast augmentation, tummy tuck and all over body lipo suction. Patient stated that her Celine Ahrunt goes there all of the time and she decided to go with her; CM cautioned patient about getting surgery abroad. CM will continue to follow for progression of care. No needs identified at this time.  Expected Discharge Date:  Possibly 01/24/2018             Expected Discharge Plan:   Home/ self care  Status of Service:   In progress  Reola MosherChandler, Marcus Groll L, RN,MHA,BSN [phone] 01/20/2018, 11:27 AM

## 2018-01-20 NOTE — Progress Notes (Signed)
Late entry: Plastic surgeon Thimmappa was paged by Thereasa Parkinauthor and made of aware of change in patient status with near syncope episode during wound care. No new orders.

## 2018-01-20 NOTE — Consult Note (Addendum)
Cardiology Consultation:   Patient ID: Erin Boyd; [id number]; 06/04/1986   Admit date: 01/18/2018 Date of Consult: 01/20/2018  Primary Care Provider: Patient, No Pcp Per Primary Cardiologist: No primary care provider on file.  Primary Electrophysiologist:  new   Patient Profile:   Erin Boyd is a 32 y.o. female with a hx of iron def anemia who is being seen today for the evaluation of syncope given hx of WPW at the request of Dr. Sunnie Nielsenegalado.   History of Present Illness:   [name] comes to the hospital yesterday with c/o fevers, abdominal complaints, generalized malaise s/p liposuction breast augmentation and tummy tuck done in [place] on July 27.  Patient on admission and is tachycardic with heart rate around 130s with temperature of 103 F and leukocytosis, CT abdomen read: "There is significant subcutaneous edema identified as well as multiple fluid collections consistent with postoperative seromas. There is likely some degree of underlying superinfection given the patient's clinical history."  Plastic surgery was consulted, and she underwent 1. Incision drainage seroma back 2. Incision drainage seroma abdomen yesterday evening.  Today while in the shower (seated) with aid of her RN, while performing her dressing care/removal of packing the patient became unresponsive.  The RN tells me initially they were unable to get a BP or pulse, and the patient was off telemetry for the shower, the patient did not fall or have trauma, she regained consiousness and initial BP was 120/40, and back on monitor ST low 100's  T max today was 102.3 early this AM  LABS K+ 3.5 BUN/Creat <5/0.82 WBC 15.1 > 17.9 > 18.0 > 18.3 H.H 9.1/29.8 Plts 341 TSH 4.473  Trop I: 0.27  The patient has/had no complaints of CP, palpitations or SOB.  She has no hx of syncope.  Mentions palpitations once with a febrile illness years ago   Past Medical History:    Diagnosis Date  . Cervical abnormality affecting pregnancy, antepartum 08/25/2015  . DVT (deep vein thrombosis) in pregnancy [facility])    dx clot in right arm in Jan 2017.  Been off Lovenox since Friday 08/23/15   . Headache    otc med prn  . Heartburn in pregnancy   . No pertinent past medical history   . Short cervix with cervical cerclage in second trimester, antepartum 09/06/2015    Past Surgical History:  Procedure Laterality Date  . abdominoplasty, augmentation mammaplasty, liposuction back thighs arms  01/01/2018   [place]  . CERVICAL CERCLAGE N/A 09/06/2015   Procedure: CERCLAGE CERVICAL;  Surgeon: Sherian ReinJody [name], MD;  Location: [facility];  Service: Gynecology;  Laterality: N/A;  . CESAREAN SECTION N/A 11/06/2015   Procedure: CESAREAN SECTION;  Surgeon: Huel CoteKathy Richardson, MD;  [facility];  Service: Obstetrics;  Laterality: N/A;  . EXCISION MASS ABDOMINAL N/A 01/19/2018   Procedure: incision and drainage of the abdomen;  Surgeon: Glenna Fellowshimmappa, Brinda, MD;  Location: [facility] OR;  Service: Plastics;  Laterality: N/A;  . INCISION AND DRAINAGE OF WOUND N/A 01/19/2018   Procedure: icision and drainage of back;  Surgeon: Glenna Fellowshimmappa, Brinda, MD;  Location: [facility] OR;  Service: Plastics;  Laterality: N/A;  . INDUCED ABORTION    . pediatric dental surgery    . WISDOM TOOTH EXTRACTION       Home Medications:  Prior to Admission medications   Not on File    Inpatient Medications: Scheduled Meds: . enoxaparin (LOVENOX) injection  40 mg Subcutaneous Q24H  . polyethylene glycol  17 g Oral Daily   Continuous Infusions: . sodium chloride 100 mL/hr (01/20/18 1341)  . piperacillin-tazobactam (ZOSYN)  IV 3.375 g (01/20/18 1355)  . vancomycin 750 mg (01/20/18 1505)   PRN Meds: acetaminophen **OR** acetaminophen, HYDROcodone-acetaminophen, morphine injection, ondansetron **OR** ondansetron (ZOFRAN) IV, senna-docusate  Allergies:    Allergies  Allergen Reactions  .  Other     PORK-  . Latex Rash    Social History:   Social History   Socioeconomic History  . Marital status: Single    Spouse name: Not on file  . Number of children: Not on file  . Years of education: Not on file  . Highest education level: Not on file  Occupational History  . Not on file  Social Needs  . Financial resource strain: Not on file  . Food insecurity:    Worry: Not on file    Inability: Not on file  . Transportation needs:    Medical: Not on file    Non-medical: Not on file  Tobacco Use  . Smoking status: Former Smoker    Packs/day: 0.50    Years: 7.00    Pack years: 3.50    Types: Cigarettes    Last attempt to quit: 06/08/2014    Years since quitting: 3.6  . Smokeless tobacco: Never Used  Substance and Sexual Activity  . Alcohol use: Yes    Comment: occasional wine - none with pregnancy  . Drug use: No  . Sexual activity: Never    Birth control/protection: IUD    Comment: approx [redacted] wks gestation   Lifestyle  . Physical activity:    Days per week: Not on file    Minutes per session: Not on file  . Stress: Not on file  Relationships  . Social connections:    Talks on phone: Not on file    Gets together: Not on file    Attends religious service: Not on file    Active member of club or organization: Not on file    Attends meetings of clubs or organizations: Not on file    Relationship status: Not on file  . Intimate partner violence:    Fear of current or ex partner: Not on file    Emotionally abused: Not on file    Physically abused: Not on file    Forced sexual activity: Not on file  Other Topics Concern  . Not on file  Social History Narrative  . Not on file    Family History:   Family History  Problem Relation Age of Onset  . Clotting disorder Neg Hx      ROS:  Please see the history of present illness.  All other ROS reviewed and negative.     Physical Exam/Data:   Vitals:   01/20/18 0451 01/20/18 0606 01/20/18 1100 01/20/18  1300  BP: (!) 103/54  125/74 131/65  Pulse: (!) 116   100  Resp: 19   20  Temp: (!) 102.3 F (39.1 C) 99.1 F (37.3 C)  99.8 F (37.7 C)  TempSrc: Oral Oral  Oral  SpO2: 96%  99% 99%  Weight: 105.5 kg     Height:        Intake/Output Summary (Last 24 hours) at 01/20/2018 1530 Last data filed at 01/20/2018 1146 Gross per 24 hour  Intake 1990 ml  Output 2400 ml  Net -410 ml   Filed Weights   01/19/18 0530 01/19/18 1736 01/20/18 0451  Weight: 103.1 kg 103.1 kg 105.5  kg   Body mass index is 33.37 kg/m.  General:  Well nourished, well developed, in no acute distress HEENT: normal Lymph: no adenopathy Neck: no JVD Endocrine:  No thryomegaly Vascular: No carotid bruits  Cardiac:  RRR; no murmurs, gallops or rubs Lungs:  CTA, no wheezing, rhonchi or rales  Abd: soft, soft, nontender to light palp, did not palp incisions, + BS Ext: no edema Musculoskeletal:  No deformities Skin: warm and dry  Neuro:  no focal abnormalities noted Psych:  Normal affect   EKG:  The EKG was personally reviewed and demonstrates:   01/19/18 SR 98bpm, no clear cut delta waves appreciated Today: ST 106bpm, no pre-excitation noted Telemetry:  Telemetry was personally reviewed and demonstrates:   SR/ST 90's-110 range  Relevant CV Studies:  echo is ordered, pending  Laboratory Data:  Chemistry Recent Labs  Lab 01/19/18 0532 01/20/18 0757 01/20/18 1323  NA 134* 136 136  K 4.0 3.5 3.5  CL 101 101 105  CO2 24 25 22   GLUCOSE 121* 111* 119*  BUN <5* <5* <5*  CREATININE 0.78 0.94 0.82  CALCIUM 8.5* 8.6* 7.9*  GFRNONAA >60 >60 >60  GFRAA >60 >60 >60  ANIONGAP 9 10 9     Recent Labs  Lab 01/18/18 2340 01/19/18 0532  PROT 6.3* 6.2*  ALBUMIN 2.5* 2.3*  AST 27 33  ALT 25 28  ALKPHOS 63 75  BILITOT 0.5 0.3   Hematology Recent Labs  Lab 01/19/18 0532 01/20/18 0757 01/20/18 1323  WBC 17.9* 18.0* 18.3*  RBC 3.79* 3.67* 3.37*  HGB 10.3* 9.8* 9.1*  HCT 33.4* 32.3* 29.8*  MCV 88.1  88.0 88.4  MCH 27.2 26.7 27.0  MCHC 30.8 30.3 30.5  RDW 20.2* 19.9* 19.8*  PLT 374 370 341   Cardiac Enzymes Recent Labs  Lab 01/20/18 1323  TROPONINI 0.27*    Recent Labs  Lab 01/18/18 2345  TROPIPOC 0.01    BNPNo results for input(s): BNP, PROBNP in the last 168 hours.  DDimer No results for input(s): DDIMER in the last 168 hours.  Radiology/Studies:   Ct Abdomen Pelvis W Contrast Result Date: 01/19/2018 CLINICAL DATA:  [place] with pain and tachycardia as well as fevers EXAM: CT ABDOMEN AND PELVIS WITH CONTRAST TECHNIQUE: Multidetector CT imaging of the abdomen and pelvis was performed using the standard protocol following bolus administration of intravenous contrast. CONTRAST:  100mL OMNIPAQUE IOHEXOL 300 MG/ML  SOLN COMPARISON:  None. FINDINGS: Lower chest: Lung bases demonstrate mild bibasilar atelectasis left greater than right. No sizable effusion is seen. There are changes consistent with bilateral breast augmentation consistent with the given clinical history. Hepatobiliary: No focal liver abnormality is seen. No gallstones, gallbladder wall thickening, or biliary dilatation. Pancreas: Unremarkable. No pancreatic ductal dilatation or surrounding inflammatory changes. Spleen: Normal in size without focal abnormality. Adrenals/Urinary Tract: Adrenal glands are unremarkable. Kidneys are normal, without renal calculi, focal lesion, or hydronephrosis. Bladder is unremarkable. Stomach/Bowel: The appendix is not well visualized. No inflammatory changes to suggest appendicitis are noted. No obstructive or inflammatory changes are seen. Vascular/Lymphatic: No significant vascular findings are present. No enlarged abdominal or pelvic lymph nodes. Reproductive: Uterus and bilateral adnexa are unremarkable. Other: There are diffuse inflammatory changes identified in the subcutaneous tissues in the area of recent surgical intervention. In the anterior  left lower abdominal wall, there is a fluid collection which measures approximately 1.8 by 7.8 cm in greatest AP and transverse dimension. It extends for approximately 12  cm in the craniocaudad projection. This is consistent with postoperative seroma. Additionally in the posterior abdominal wall just above the iliac crests there is a 15 by 2.5 cm fluid collection consistent with postoperative seroma. This extends for at least 9 cm in craniocaudad projection. Diffuse subcutaneous edema is noted throughout the surgical beds. Smaller irregular fluid collections are noted in the buttock regions bilaterally. Musculoskeletal: No acute or significant osseous findings. IMPRESSION: There are postoperative changes consistent with the patient's given clinical history of breast augmentation, liposuction and tummy tuck. There is significant subcutaneous edema identified as well as multiple fluid collections consistent with postoperative seromas. There is likely some degree of underlying superinfection given the patient's clinical history. Electronically Signed   By: Alcide Clever M.D.   On: 01/19/2018 02:59   Dg Chest Portable 1 View Result Date: 01/19/2018 CLINICAL DATA:  Weight reduction surgeries. Now septic and short of breath. EXAM: PORTABLE CHEST 1 VIEW COMPARISON:  12/23/2015 FINDINGS: The heart size and mediastinal contours are within normal limits. Both lungs are clear. The visualized skeletal structures are unremarkable. IMPRESSION: No active disease. Electronically Signed   By: Burman Nieves M.D.   On: 01/19/2018 00:08    Assessment and Plan:   1. Syncope 2. Hx known WPW     Very much vagal sounding event     She was not on telemetry during the event     Doubt arrhythmogenic event  We can follow up out patient, appointment made.         For questions or updates, please contact [facility] Please consult [email or web address] for contact info under Cardiology/STEMI.   Signed, Sheilah Pigeon, PA-C   01/20/2018 3:30 PM  EP Attending  Patient seen and examined. Agree with above. The patient has a h/o WPW but is no longer pre-excited. She had an episode of vasomotor syncope earlier today. She was not on tele. The patient has had one remote episodes of symptoms in the past. Her exam is notable for clean and dry abdominal incisions, clear lungs and RRR. Tele show ST and no pre-excitation. A/P 1. WPW - the patient does not have any residual ventricular pre-excitation and is not at risk for life threatening arrhythmias. I will see her back in the office in 4-6 weeks. Call for questions.  2. Vasomotor syncope - keep her hydrated. No additonal treatment.   [facility] will sign off.   Medication Recommendations:  none Other recommendations (labs, testing, etc):  none Follow up as an outpatient:  Dr. Ladona Ridgel in 4-6 weeks.  Leonia Reeves.D.

## 2018-01-20 NOTE — Progress Notes (Signed)
Plastic Surgery  POD#1 Incision and drainage abdomen and back seromas Called following patient passed out in shower, rapid response called. Staff able to remove back packing while in shower.  PE:  Alert, NAD Back- resolved cellulitis, flat soft Abd: soft removed packing, no significant drainage, no cellulitis   A/P  Work up of event per primary team. No bleeding or acute changes surgical sites. Reviewed intraop findings with patient  Glenna FellowsBrinda Dickey Caamano, MD [facility] Plastic & Reconstructive Surgery [phone], pin [phone]

## 2018-01-20 NOTE — Progress Notes (Addendum)
PROGRESS NOTE    [name]  MVH:[id number] DOB: Aug 03, 1985 DOA: 01/18/2018 PCP: Erin Boyd, No Pcp Per   Brief Narrative: Erin Boyd is a 32 y.o. female with history of iron deficiency anemia presents to the ER with complaint of increasing pain in the lower abdomen and back over the last 2 days with not feeling well.  Also has been having subjective feeling of fever and chills.  Denies any nausea vomiting diarrhea productive cough or chest pain.  Erin Boyd had liposuction breast augmentation and tummy tuck done in [place] on July 27 2 weeks ago and had just come back to [place] last week.  In addition to the pain Erin Boyd also noticed some mild discharge from the surgical site.  ED Course: In the ER CT abdomen pelvis done shows seroma with possible infection at the surgical site in the lower abdomen.  Erin Boyd on admission and is tachycardic with heart rate around 130s with temperature of 103 F and leukocytosis.  ER physician discussed with on-call general surgeon who advised antibiotics and possible need for plastic surgery consult.  Assessment & Plan:   Principal Problem:   Sepsis ([place]) Active Problems:   Wolff-Parkinson-White (WPW) pattern   Iron deficiency anemia due to chronic blood loss  1-Sepsis; presents with fever, tachycardia, leukocystosis. Source prior  surgery infection.  Continue with IV fluids, IV antibiotics.   2-Infection abdomen and back, post surgery liposuction and tummy tuck,  Had surgery out side of [place].  IV antibiotics. Vancomycin and zosyn.  Surgery and plastic surgery consulted.  Cancel Ct back, underwent sx.  Underwent I and D of abdomen and seromas on 8-15. Culture pending, growing GPC.   3-Syncope;  Erin Boyd pass out, today while in the bathroom, she was sitting at time of episode. She became unresponsive. She was off monitor.  -She regain consciousness. She denies chest pain or dyspnea. She was feeling dizzy.  -IV  Bolus 1 L.  -EKG ordered, sinus tachycardia.  -check cbc, B-met, troponin.  -cardiology consulted due to prior history of WPW and suycope event.  D dimer positive, will get CT chest rule out PE.   History of WPW; EKG; reviewed EKG with cardiology on called. Erin Boyd PR is not short. He recommend follow EKG in am.       DVT prophylaxis:SCD Code Status:full code.  Family Communication: care discussed with Erin Boyd.  Disposition Plan: remain inpatient.   Consultants:   Sx   Procedures:   I and D of seroma 8-14   Antimicrobials:  Vancomycin and zosyn.    Subjective: Erin Boyd seen earlier this morning, she was feeling well. Reported Back pain was better.   This afternoon, around 12 o clock. She pass out, while in the restroom. She was off monitor. She subsequently became more alert. She denies chest pain or dyspnea. She was sluggish.   Objective: Vitals:   01/20/18 0008 01/20/18 0451 01/20/18 0606 01/20/18 1100  BP: 111/68 (!) 103/54  125/74  Pulse: 73 (!) 116    Resp: 18 19    Temp: 99.1 F (37.3 C) (!) 102.3 F (39.1 C) 99.1 F (37.3 C)   TempSrc: Oral Oral Oral   SpO2: 99% 96%  99%  Weight:  105.5 kg    Height:        Intake/Output Summary (Last 24 hours) at 01/20/2018 1237 Last data filed at 01/20/2018 1146 Gross per 24 hour  Intake 1990 ml  Output 2400 ml  Net -410 ml   Filed  Weights   01/19/18 0530 01/19/18 1736 01/20/18 0451  Weight: 103.1 kg 103.1 kg 105.5 kg    Examination:  General exam: NAD, sluggish.  Respiratory system: Normal respiratory effort, CTA Cardiovascular system: S 1, S 2 RRR Gastrointestinal system: BS present, soft, mild tenderness, dressing in place.  Central nervous system: alert, speech clear. Moving extremities.  Extremities: no edema Skin: no rashes.   Data Reviewed: I have personally reviewed following labs and imaging studies  CBC: Recent Labs  Lab 01/18/18 2340 01/19/18 0532 01/20/18 0757  WBC 15.1* 17.9* 18.0*    NEUTROABS 11.1* 13.5*  --   HGB 10.2* 10.3* 9.8*  HCT 33.5* 33.4* 32.3*  MCV 87.7 88.1 88.0  PLT 402* 374 387   Basic Metabolic Panel: Recent Labs  Lab 01/18/18 2340 01/19/18 0532 01/20/18 0757  NA 131* 134* 136  K 3.9 4.0 3.5  CL 99 101 101  CO2 _0 GLUCOSE 121* 121* 111*  BUN 5* <5* <5*  CREATININE 0.74 0.78 0.94  CALCIUM 8.3* 8.5* 8.6*   GFR: Estimated Creatinine Clearance: 115.1 mL/min (by C-G formula based on SCr of 0.94 mg/dL). Liver Function Tests: Recent Labs  Lab 01/18/18 2340 01/19/18 0532  AST 27 33  ALT 25 28  ALKPHOS 63 75  BILITOT 0.5 0.3  PROT 6.3* 6.2*  ALBUMIN 2.5* 2.3*   No results for input(s): LIPASE, AMYLASE in the last 168 hours. No results for input(s): AMMONIA in the last 168 hours. Coagulation Profile: Recent Labs  Lab 01/18/18 2340 01/19/18 0532  INR 1.03 1.06   Cardiac Enzymes: No results for input(s): CKTOTAL, CKMB, CKMBINDEX, TROPONINI in the last 168 hours. BNP (last 3 results) No results for input(s): PROBNP in the last 8760 hours. HbA1C: No results for input(s): HGBA1C in the last 72 hours. CBG: Recent Labs  Lab 01/20/18 1225  GLUCAP 123*   Lipid Profile: No results for input(s): CHOL, HDL, LDLCALC, TRIG, CHOLHDL, LDLDIRECT in the last 72 hours. Thyroid Function Tests: Recent Labs    01/19/18 0532  TSH 4.473   Anemia Panel: No results for input(s): VITAMINB12, FOLATE, FERRITIN, TIBC, IRON, RETICCTPCT in the last 72 hours. Sepsis Labs: Recent Labs  Lab 01/18/18 2347 01/19/18 0121 01/19/18 0532 01/19/18 0714  PROCALCITON  --   --  0.13  --   LATICACIDVEN 1.09 1.20 0.8 0.8    Recent Results (from the past 240 hour(s))  Culture, blood (Routine x 2)     Status: None (Preliminary result)   Collection Time: 01/18/18 11:40 PM  Result Value Ref Range Status   Specimen Description BLOOD LEFT HAND  Final   Special Requests   Final    BOTTLES DRAWN AEROBIC AND ANAEROBIC Blood Culture adequate volume    Culture   Final    NO GROWTH 1 DAY Performed at [facility], [place] [address], [place], [place] [address]    Report Status PENDING  Incomplete  Culture, blood (Routine x 2)     Status: None (Preliminary result)   Collection Time: 01/19/18  1:10 AM  Result Value Ref Range Status   Specimen Description BLOOD LEFT HAND  Final   Special Requests   Final    BOTTLES DRAWN AEROBIC AND ANAEROBIC Blood Culture adequate volume   Culture   Final    NO GROWTH 1 DAY Performed at [facility], [place] [address], [place], [place] [address]    Report Status PENDING  Incomplete  Aerobic/Anaerobic Culture (surgical/deep wound)  Status: None (Preliminary result)   Collection Time: 01/19/18  8:00 PM  Result Value Ref Range Status   Specimen Description TISSUE BACK SEROMA  Final   Special Requests SPECIMEN A  Final   Gram Stain   Final    MODERATE WBC PRESENT,BOTH PMN AND MONONUCLEAR FEW GRAM POSITIVE COCCI Performed at [facility], [place] [address], [place], [place] [address]    Culture PENDING  Incomplete   Report Status PENDING  Incomplete  Aerobic/Anaerobic Culture (surgical/deep wound)     Status: None (Preliminary result)   Collection Time: 01/19/18  8:00 PM  Result Value Ref Range Status   Specimen Description TISSUE ABDOMINAL SEROMA  Final   Special Requests SPECIMEN B  Final   Gram Stain   Final    FEW WBC PRESENT,BOTH PMN AND MONONUCLEAR NO ORGANISMS SEEN Performed at [facility], [place] [address] [place], [place] [address]    Culture PENDING  Incomplete   Report Status PENDING  Incomplete         Radiology Studies: Ct Abdomen Pelvis W Contrast  Result Date: 01/19/2018 CLINICAL DATA:  History of recent cosmetic surgery in [place] with pain and tachycardia as well as fevers EXAM: CT ABDOMEN AND PELVIS WITH CONTRAST TECHNIQUE: Multidetector CT imaging of the abdomen and pelvis was performed using the standard protocol following bolus administration of  intravenous contrast. CONTRAST:  172m OMNIPAQUE IOHEXOL 300 MG/ML  SOLN COMPARISON:  None. FINDINGS: Lower chest: Lung bases demonstrate mild bibasilar atelectasis left greater than right. No sizable effusion is seen. There are changes consistent with bilateral breast augmentation consistent with the given clinical history. Hepatobiliary: No focal liver abnormality is seen. No gallstones, gallbladder wall thickening, or biliary dilatation. Pancreas: Unremarkable. No pancreatic ductal dilatation or surrounding inflammatory changes. Spleen: Normal in size without focal abnormality. Adrenals/Urinary Tract: Adrenal glands are unremarkable. Kidneys are normal, without renal calculi, focal lesion, or hydronephrosis. Bladder is unremarkable. Stomach/Bowel: The appendix is not well visualized. No inflammatory changes to suggest appendicitis are noted. No obstructive or inflammatory changes are seen. Vascular/Lymphatic: No significant vascular findings are present. No enlarged abdominal or pelvic lymph nodes. Reproductive: Uterus and bilateral adnexa are unremarkable. Other: There are diffuse inflammatory changes identified in the subcutaneous tissues in the area of recent surgical intervention. In the anterior left lower abdominal wall, there is a fluid collection which measures approximately 1.8 by 7.8 cm in greatest AP and transverse dimension. It extends for approximately 12 cm in the craniocaudad projection. This is consistent with postoperative seroma. Additionally in the posterior abdominal wall just above the iliac crests there is a 15 by 2.5 cm fluid collection consistent with postoperative seroma. This extends for at least 9 cm in craniocaudad projection. Diffuse subcutaneous edema is noted throughout the surgical beds. Smaller irregular fluid collections are noted in the buttock regions bilaterally. Musculoskeletal: No acute or significant osseous findings. IMPRESSION: There are postoperative changes consistent  with the Erin Boyd's given clinical history of breast augmentation, liposuction and tummy tuck. There is significant subcutaneous edema identified as well as multiple fluid collections consistent with postoperative seromas. There is likely some degree of underlying superinfection given the Erin Boyd's clinical history. Electronically Signed   By: MInez CatalinaM.D.   On: 01/19/2018 02:59   Dg Chest Portable 1 View  Result Date: 01/19/2018 CLINICAL DATA:  Weight reduction surgeries. Now septic and short of breath. EXAM: PORTABLE CHEST 1 VIEW COMPARISON:  12/23/2015 FINDINGS: The heart size and mediastinal contours are within  normal limits. Both lungs are clear. The visualized skeletal structures are unremarkable. IMPRESSION: No active disease. Electronically Signed   By: Lucienne Capers M.D.   On: 01/19/2018 00:08        Scheduled Meds: . enoxaparin (LOVENOX) injection  40 mg Subcutaneous Q24H  . polyethylene glycol  17 g Oral Daily   Continuous Infusions: . sodium chloride    . lactated ringers 10 mL/hr (01/20/18 0956)  . piperacillin-tazobactam (ZOSYN)  IV Stopped (01/20/18 1000)  . sodium chloride    . vancomycin Stopped (01/20/18 0800)     LOS: 1 day    Time spent: 35 minutes.     Elmarie Shiley, MD [facility] Pager [phone]  If 7PM-7AM, please contact night-coverage [email or web address] Password [facility] 01/20/2018, 12:37 PM

## 2018-01-21 ENCOUNTER — Inpatient Hospital Stay (HOSPITAL_COMMUNITY): Payer: Medicaid Other

## 2018-01-21 DIAGNOSIS — R55 Syncope and collapse: Secondary | ICD-10-CM

## 2018-01-21 DIAGNOSIS — R7989 Other specified abnormal findings of blood chemistry: Secondary | ICD-10-CM

## 2018-01-21 LAB — ECHOCARDIOGRAM COMPLETE
Height: 70 in
Weight: 3679.04 oz

## 2018-01-21 MED ORDER — TECHNETIUM TC 99M DIETHYLENETRIAME-PENTAACETIC ACID
31.4000 | Freq: Once | INTRAVENOUS | Status: AC | PRN
  Administered 2018-01-21: 31.4 via INTRAVENOUS

## 2018-01-21 MED ORDER — HYDROCORTISONE ACETATE 25 MG RE SUPP
25.0000 mg | Freq: Two times a day (BID) | RECTAL | Status: DC
  Administered 2018-01-21: 25 mg via RECTAL
  Filled 2018-01-21 (×3): qty 1

## 2018-01-21 MED ORDER — TECHNETIUM TO 99M ALBUMIN AGGREGATED
4.2000 | Freq: Once | INTRAVENOUS | Status: AC | PRN
  Administered 2018-01-21: 4.2 via INTRAVENOUS

## 2018-01-21 MED ORDER — HYDROCORTISONE ACETATE 25 MG RE SUPP: 25.0000 mg | Freq: Two times a day (BID) | RECTAL | Status: DC

## 2018-01-21 NOTE — Progress Notes (Signed)
Bilateral lower extremity venous duplex has been completed. Negative for DVT.  01/21/18 11:21 AM Olen CordialGreg Aidyn Sportsman RVT

## 2018-01-21 NOTE — Progress Notes (Signed)
BUE venous duplex prelim: negative for DVT and superficial thrombosis.  Khian Remo Eunice, RDMS, RVT  

## 2018-01-21 NOTE — Progress Notes (Addendum)
Plastic Surgery  POD#2 Incision and drainage abdomen and back seromas  945am attempted to see patient- she is out of room for study. Will return in pm.  Glenna FellowsBrinda Heberto Sturdevant, MD [facility] Plastic & Reconstructive Surgery [phone], pin [phone]   ADDENDUM 1515 01/21/18  Patient seen and examined. Overall feeling much better less pain. Had BM this PM, states her hemorrhoids acting up and needs medication for this. Asking me to alert her team. Concern by primary team for firmness of abdomen.  She did spike temp overnight. DVT and V/Q studies negative. Culture back with S. Aureus.  Temp:  [98.5 F (36.9 C)-101.8 F (38.8 C)] 98.7 F (37.1 C) (08/16 1431) Pulse Rate:  [92-122] 92 (08/16 1431) Resp:  [16-18] 18 (08/16 1431) BP: (101-137)/(60-76) 116/65 (08/16 1431) SpO2:  [96 %-98 %] 98 % (08/16 1431) Weight:  [104.3 kg] 104.3 kg (08/16 0448)   Gen: alert NAD Abd: dressing with small amount drainage, probed wound and no fluid obtained, no cellulitis, soft tissue with induration unchanged since initial exam Back: low back over sacrum with seroma noted no cellulitis. Probed incision and able to drain seroma. Placed packing  A/P Feel abdomen exam is consistent with recent surgery, extensive liposuction. Will take months for induration to resolve. Dry dressing alone abdomen.  Back with recurrent seroma, placed packing and will likely need to do this daily. New orders written. Ok to shower.  The patient had many questions, mostly regarding home compression garments, manual lymph massage, taping incision- counseled her to hold off on all of these until infection cleared wound healed and has been seen in clinic.  Glenna FellowsBrinda Deandrea Rion, MD [facility] Pl[facility] & Reconstructive Surgery [phone], pin [phone]

## 2018-01-21 NOTE — Progress Notes (Signed)
Pt dressing was changed by MD. MD stated that she would place orders for dressing changes.

## 2018-01-21 NOTE — Progress Notes (Signed)
PROGRESS NOTE    Erin Boyd  ZOX:[id number] DOB: 11/21/1985 DOA: 01/18/2018 PCP: Patient, No Pcp Per   Brief Narrative: Erin Boyd is a 32 y.o. female with history of iron deficiency anemia presents to the ER with complaint of increasing pain in the lower abdomen and back over the last 2 days with not feeling well.  Also has been having subjective feeling of fever and chills.  Denies any nausea vomiting diarrhea productive cough or chest pain.  Patient had liposuction breast augmentation and tummy tuck done in [place] on July 27 2 weeks ago and had just come back to [place] last week.  In addition to the pain patient also noticed some mild discharge from the surgical site.  ED Course: In the ER CT abdomen pelvis done shows seroma with possible infection at the surgical site in the lower abdomen.  Patient on admission and is tachycardic with heart rate around 130s with temperature of 103 F and leukocytosis.  ER physician discussed with on-call general surgeon who advised antibiotics and possible need for plastic surgery consult.  Assessment & Plan:   Principal Problem:   Sepsis ([facility]) Active Problems:   Wolff-Parkinson-White (WPW) pattern   Iron deficiency anemia due to chronic blood loss   Syncope  1-Sepsis; presents with fever, tachycardia, leukocystosis. Source prior  surgery infection.  Continue with IV fluids, IV antibiotics.  Still febrile, at tempeture at 101 last night. WBC still elevated.   2-Infection abdomen and back, post surgery liposuction and tummy tuck,  Had surgery out side of [place].  IV antibiotics. Vancomycin and zosyn.  Surgery and plastic surgery consulted.  Cancel Ct back, underwent sx.  Underwent I and D of abdomen and seromas on 8-15. Culture pending, growing staph aureus.  Still with leukocytosis and febrile continue with IV antibiotics.   3-Syncope;  Patient pass out, today while in the bathroom, she was sitting at  time of episode. She became unresponsive. She was off monitor.  -She regain consciousness. She denies chest pain or dyspnea. She was feeling dizzy.  -cardiology consulted due to prior history of WPW and suycope event. Cardiology think patient had vaso-vagal syncope.  -on IV fluids.  -D dimer positive, doppler and VQ scan negative for blood clot.   History of WPW; EKG; reviewed EKG with cardiology on called. Patient PR is not short. He recommend follow EKG in am.   Hemorrhoids; start annusol.      DVT prophylaxis:SCD Code Status:full code.  Family Communication: care discussed with patient.  Disposition Plan: remain inpatient.   Consultants:   Sx   Procedures:   I and D of seroma 8-14   Antimicrobials:  Vancomycin and zosyn.    Subjective: She is feeling better, denies lightheaded.  Feels back is swollen again.   Objective: Vitals:   01/20/18 2346 01/21/18 0008 01/21/18 0146 01/21/18 0448  BP: 101/62   137/71  Pulse: (!) 122 (!) 119  98  Resp: 16   16  Temp: (!) 101.4 F (38.6 C)  98.8 F (37.1 C) 98.5 F (36.9 C)  TempSrc: Oral  Oral Oral  SpO2: 96%   96%  Weight:    104.3 kg  Height:        Intake/Output Summary (Last 24 hours) at 01/21/2018 0841 Last data filed at 01/21/2018 0520 Gross per 24 hour  Intake 2336.15 ml  Output 3250 ml  Net -913.85 ml   Filed Weights   01/19/18 1736 01/20/18 0451 01/21/18 0448  Weight: 103.1 kg 105.5 kg 104.3 kg    Examination:  General exam:NAD Respiratory system: CTA Cardiovascular system: S 1, S 2 RRR Gastrointestinal system: BS present, soft, tender, dressing abdomen and back  Central nervous system: non focal.  Extremities: trace edema Skin: no rashes.   Data Reviewed: I have personally reviewed following labs and imaging studies  CBC: Recent Labs  Lab 01/18/18 2340 01/19/18 0532 01/20/18 0757 01/20/18 1323  WBC 15.1* 17.9* 18.0* 18.3*  NEUTROABS 11.1* 13.5*  --   --   HGB 10.2* 10.3* 9.8* 9.1*    HCT 33.5* 33.4* 32.3* 29.8*  MCV 87.7 88.1 88.0 88.4  PLT 402* 374 370 341   Basic Metabolic Panel: Recent Labs  Lab 01/18/18 2340 01/19/18 0532 01/20/18 0757 01/20/18 1323  NA 131* 134* 136 136  K 3.9 4.0 3.5 3.5  CL 99 101 101 105  CO2 22 24 25 22   GLUCOSE 121* 121* 111* 119*  BUN 5* <5* <5* <5*  CREATININE 0.74 0.78 0.94 0.82  CALCIUM 8.3* 8.5* 8.6* 7.9*   GFR: Estimated Creatinine Clearance: 131.1 mL/min (by C-G formula based on SCr of 0.82 mg/dL). Liver Function Tests: Recent Labs  Lab 01/18/18 2340 01/19/18 0532  AST 27 33  ALT 25 28  ALKPHOS 63 75  BILITOT 0.5 0.3  PROT 6.3* 6.2*  ALBUMIN 2.5* 2.3*   No results for input(s): LIPASE, AMYLASE in the last 168 hours. No results for input(s): AMMONIA in the last 168 hours. Coagulation Profile: Recent Labs  Lab 01/18/18 2340 01/19/18 0532  INR 1.03 1.06   Cardiac Enzymes: Recent Labs  Lab 01/20/18 1323  TROPONINI 0.27*   BNP (last 3 results) No results for input(s): PROBNP in the last 8760 hours. HbA1C: No results for input(s): HGBA1C in the last 72 hours. CBG: Recent Labs  Lab 01/20/18 1225  GLUCAP 123*   Lipid Profile: No results for input(s): CHOL, HDL, LDLCALC, TRIG, CHOLHDL, LDLDIRECT in the last 72 hours. Thyroid Function Tests: Recent Labs    01/19/18 0532  TSH 4.473   Anemia Panel: No results for input(s): VITAMINB12, FOLATE, FERRITIN, TIBC, IRON, RETICCTPCT in the last 72 hours. Sepsis Labs: Recent Labs  Lab 01/18/18 2347 01/19/18 0121 01/19/18 0532 01/19/18 0714  PROCALCITON  --   --  0.13  --   LATICACIDVEN 1.09 1.20 0.8 0.8    Recent Results (from the past 240 hour(s))  Culture, blood (Routine x 2)     Status: None (Preliminary result)   Collection Time: 01/18/18 11:40 PM  Result Value Ref Range Status   Specimen Description BLOOD LEFT HAND  Final   Special Requests   Final    BOTTLES DRAWN AEROBIC AND ANAEROBIC Blood Culture adequate volume   Culture   Final    NO  GROWTH 1 DAY Performed at [facility], [address] [place], [place] [address]    Report Status PENDING  Incomplete  Culture, blood (Routine x 2)     Status: None (Preliminary result)   Collection Time: 01/19/18  1:10 AM  Result Value Ref Range Status   Specimen Description BLOOD LEFT HAND  Final   Special Requests   Final    BOTTLES DRAWN AEROBIC AND ANAEROBIC Blood Culture adequate volume   Culture   Final    NO GROWTH 1 DAY Performed at [facility], [address] [place], [place] [address]    Report Status PENDING  Incomplete  Aerobic/Anaerobic Culture (surgical/deep wound)  Status: None (Preliminary result)   Collection Time: 01/19/18  8:00 PM  Result Value Ref Range Status   Specimen Description TISSUE BACK SEROMA  Final   Special Requests SPECIMEN A  Final   Gram Stain   Final    MODERATE WBC PRESENT,BOTH PMN AND MONONUCLEAR FEW GRAM POSITIVE COCCI    Culture   Final    TOO YOUNG TO READ Performed at [facility], [address] [place], [place] [address]    Report Status PENDING  Incomplete  Aerobic/Anaerobic Culture (surgical/deep wound)     Status: None (Preliminary result)   Collection Time: 01/19/18  8:00 PM  Result Value Ref Range Status   Specimen Description TISSUE ABDOMINAL SEROMA  Final   Special Requests SPECIMEN B  Final   Gram Stain   Final    FEW WBC PRESENT,BOTH PMN AND MONONUCLEAR NO ORGANISMS SEEN    Culture   Final    TOO YOUNG TO READ Performed at [facility], [address], [place], [place] [address]    Report Status PENDING  Incomplete         Radiology Studies: No results found.      Scheduled Meds: . enoxaparin (LOVENOX) injection  40 mg Subcutaneous Q24H  . polyethylene glycol  17 g Oral Daily   Continuous Infusions: . sodium chloride 125 mL/hr at 01/21/18 0418  . sodium chloride Stopped (01/20/18 2313)  . piperacillin-tazobactam (ZOSYN)  IV 3.375 g (01/21/18 0654)  . vancomycin 750 mg  (01/21/18 0653)     LOS: 2 days    Time spent: 35 minutes.     Alba Cory, MD [facility] Pager [phone]  If 7PM-7AM, please contact night-coverage [email or web address] Password [facility] 01/21/2018, 8:41 AM

## 2018-01-21 NOTE — Progress Notes (Signed)
  Echocardiogram 2D Echocardiogram has been performed.  Erin SavoyCasey N Holy Boyd 01/21/2018, 10:48 AM

## 2018-01-21 NOTE — Plan of Care (Signed)
  Problem: Activity: Goal: Risk for activity intolerance will decrease Outcome: Progressing   Problem: Elimination: Goal: Will not experience complications related to bowel motility Outcome: Progressing   Problem: Pain Managment: Goal: General experience of comfort will improve Outcome: Progressing   Problem: Safety: Goal: Ability to remain free from injury will improve Outcome: Progressing   Problem: Pain Managment: Goal: General experience of comfort will improve Outcome: Progressing

## 2018-01-21 NOTE — Progress Notes (Signed)
Radiology called RN and told RN that MD needs to place order for 2 view chest x-ray for pt to get it done during VQ Scan. RN has paged MD Regalado. Waiting for order to be place at this time.

## 2018-01-21 NOTE — Progress Notes (Signed)
MD Regalado Ask RN to page Plastic Surgeon regarding pt's discomfort to surgical site and abd being taut. Also to mention that pt had spiked a fever last night. MD Thimmappa called RN back and stated that she is aware of pt's condition and will come evaluate pt after her surgeries.

## 2018-01-22 LAB — BASIC METABOLIC PANEL
Anion gap: 8 (ref 5–15)
BUN: <5 mg/dL — ABNORMAL LOW (ref 6–20)
CO2: 25 mmol/L (ref 22–32)
Calcium: 8.6 mg/dL — ABNORMAL LOW (ref 8.9–10.3)
Chloride: 109 mmol/L (ref 98–111)
Creatinine, Ser: 0.79 mg/dL (ref 0.44–1.00)
GFR calc non Af Amer: >60 mL/min (ref >60)
Glucose, Bld: 113 mg/dL — ABNORMAL HIGH (ref 70–99)
Potassium: 3.9 mmol/L (ref 3.5–5.1)
SODIUM: 142 mmol/L (ref 135–145)

## 2018-01-22 LAB — VANCOMYCIN, TROUGH: VANCOMYCIN TR: 13 ug/mL — AB (ref 15–20)

## 2018-01-22 LAB — CBC
HEMATOCRIT: 31.3 % — AB (ref 36.0–46.0)
Hemoglobin: 9.2 g/dL — ABNORMAL LOW (ref 12.0–15.0)
MCH: 26.4 pg (ref 26.0–34.0)
MCHC: 29.4 g/dL — AB (ref 30.0–36.0)
MCV: 89.7 fL (ref 78.0–100.0)
Platelets: 372 10*3/uL (ref 150–400)
RBC: 3.49 MIL/uL — ABNORMAL LOW (ref 3.87–5.11)
RDW: 20.3 % — ABNORMAL HIGH (ref 11.5–15.5)
WBC: 12.9 10*3/uL — AB (ref 4.0–10.5)

## 2018-01-22 MED ORDER — ACETAMINOPHEN 325 MG PO TABS: 650.0000 mg | ORAL_TABLET | Freq: Four times a day (QID) | ORAL | 0 refills | Status: DC | PRN

## 2018-01-22 MED ORDER — DOXYCYCLINE HYCLATE 50 MG PO CAPS: 100.0000 mg | ORAL_CAPSULE | Freq: Two times a day (BID) | ORAL | 0 refills | Status: AC

## 2018-01-22 MED ORDER — POLYETHYLENE GLYCOL 3350 17 G PO PACK: 17.0000 g | PACK | Freq: Every day | ORAL | 0 refills | Status: DC

## 2018-01-22 MED ORDER — DOXYCYCLINE HYCLATE 100 MG PO TABS
100.0000 mg | ORAL_TABLET | Freq: Two times a day (BID) | ORAL | Status: DC
  Administered 2018-01-22: 100 mg via ORAL
  Filled 2018-01-22: qty 1

## 2018-01-22 MED ORDER — VANCOMYCIN HCL IN DEXTROSE 1-5 GM/200ML-% IV SOLN
1000.0000 mg | Freq: Three times a day (TID) | INTRAVENOUS | Status: DC
  Filled 2018-01-22: qty 200

## 2018-01-22 MED ORDER — HYDROCODONE-ACETAMINOPHEN 5-325 MG PO TABS: 1.0000 | ORAL_TABLET | Freq: Four times a day (QID) | ORAL | 0 refills | Status: DC | PRN

## 2018-01-22 NOTE — Progress Notes (Signed)
IV removed, cardiac monitoring removed, dressing changed and patient and family educated on dressing changes. Patient stable and ready to go home.

## 2018-01-22 NOTE — Progress Notes (Signed)
Patient resting comfortably during shift report. Denies complaints.  

## 2018-01-22 NOTE — Progress Notes (Signed)
Pharmacy Antibiotic Note  Mathews Robinsonsshley A Miley-Muhammad is a 32 y.o. female admitted on 01/18/2018 with sepsis.  [place]. Pharmacy has been consulted for Vancomycin dosing.   8/17: Now afebrile, WBC trending down, SCr improved from admit  Plan: Increase Vancomycin to 1000mg  IV q8h (next dose due 8/17 @ 1500) Trend WBC, temp, renal function  F/U infectious work-up, vanc levels as indicated Consider length of therapy  Height: 5\' 10"  (177.8 cm) Weight: 229 lb 15 oz (104.3 kg) IBW/kg (Calculated) : 68.5  Temp (24hrs), Avg:98.7 F (37.1 C), Min:97.8 F (36.6 C), Max:99.2 F (37.3 C)  Recent Labs  Lab 01/18/18 2340 01/18/18 2347 01/19/18 0121 01/19/18 0532 01/19/18 0714 01/20/18 0757 01/20/18 1323 01/22/18 0613  WBC 15.1*  --   --  17.9*  --  18.0* 18.3* 12.9*  CREATININE 0.74  --   --  0.78  --  0.94 0.82 0.79  LATICACIDVEN  --  1.09 1.20 0.8 0.8  --   --   --   VANCOTROUGH  --   --   --   --   --   --   --  13*    Estimated Creatinine Clearance: 134.4 mL/min (by C-G formula based on SCr of 0.79 mg/dL).    Allergies  Allergen Reactions  . Other     PORK-  . Latex Rash   Antibiotics: 8/14 Vanc >> 8/14 Zosyn>>8/17  Cultures: 8/14 Blood x 2 >>NGTD 8/14 Seroma Tissue >> few staph aureas  Thank you for involving pharmacy in this patient's care.  Wendelyn Breslowylan Marcus Schwandt, PharmD PGY1 Pharmacy Resident Phone: [phone] 01/22/2018 10:32 AM

## 2018-01-22 NOTE — Progress Notes (Signed)
Patient informed that she must have family at bedside to assist/learn about wound care prior to discharge. Per pt, her mother will be here to pick her up today; but she will not be the one who will be helping her at home. Pt states she wants the RN to perform wound care and let her mother record  (on patients cell phone) so that the family member(s) who will be helping can watch the video to learn.   department AD approved this method of information sharing, as it is the patients idea/device/body, and this nurse will not be in it other than her hands.

## 2018-01-22 NOTE — Progress Notes (Signed)
Plastic Surgery  POD#3 Incision and drainage abdomen and back seromas  Temp:  [97.8 F (36.6 C)-99.2 F (37.3 C)] 97.8 F (36.6 C) (08/17 0547) Pulse Rate:  [77-95] 77 (08/17 0547) Resp:  [18] 18 (08/17 0547) BP: (115-130)/(65-78) 115/71 (08/17 0547) SpO2:  [98 %-100 %] 98 % (08/17 0547)  Seroma back culture S aureus, sensitivities pending   Abd: dressing with small amount drainage, no cellulitis, soft tissue with induration unchanged Back: low back packing in place, flat/no seroma no cellulitis.   Thighs has two nylon sutures over liposuction sites- will remove these at her OP visit  A/P Feel stable for discharge today or tomorrow F/u within week post discharge, call for appt Continue packing back until OP appt. Ok to remove dressings and shower, no soaking. Recommend her family be present for wound care this weekend to learn. Discussed with primary team  Glenna FellowsBrinda Krista Godsil, MD [facility] Plastic & Reconstructive Surgery [phone], pin [phone]

## 2018-01-22 NOTE — Discharge Summary (Signed)
Physician Discharge Summary  Erin Boyd:[id number] DOB: Mar 12, 1986 DOA: 01/18/2018  PCP: Patient, No Pcp Per  Admit date: 01/18/2018 Discharge date: 01/22/2018  Admitted From: Home  Disposition: Home   Recommendations for Outpatient Follow-up:  1. Follow up with PCP in 1-2 weeks 2. Please obtain BMP/CBC in one week 3. Follow up[ with plastic sx. For wound care.  4. Follow with cardiology   Home Health: yes.   Discharge Condition; stable.  CODE STATUS: full code.  Diet recommendation: Heart Healthy   Brief/Interim Summary:  Brief Narrative: Erin Boyd a 32 y.o.femalewithhistory of iron deficiency anemia presents to the ER with complaint of increasing pain in the lower abdomen and back over the last 2 days with not feeling well. Also has been having subjective feeling of fever and chills. Denies any nausea vomiting diarrhea productive cough or chest pain. Patient had liposuction breast augmentation and tummy tuck done in [place] on July 27 2 weeks ago and had just come back to [place] last week. In addition to the pain patient also noticed some mild discharge from the surgical site.  ED Course:In the ER CT abdomen pelvis done shows seroma with possible infection at the surgical site in the lower abdomen. Patient on admission and is tachycardic with heart rate around 130s with temperature of 103 F and leukocytosis. ER physician discussed with on-call general surgeon who advised antibiotics and possible need for plastic surgery consult.  Assessment & Plan:   Principal Problem:   Sepsis (HCC) Active Problems:   Wolff-Parkinson-White (WPW) pattern   Iron deficiency anemia due to chronic blood loss   Syncope  1-Sepsis; presents with fever, tachycardia, leukocystosis. Source prior  surgery infection.  Continue with IV fluids, IV antibiotics.  Still febrile, at tempeture at 101 last night. WBC still elevated.    2-Infection abdomen and back, post surgery liposuction and tummy tuck,  Had surgery out side of [place].  IV antibiotics. Vancomycin and zosyn.  Surgery and plastic surgery consulted.  Cancel Ct back, underwent sx.  Underwent I and D of abdomen and seromas on 8-15. Culture pending, growing staph aureus. sensitive to tetracycline. Discharge on Doxy   3-Syncope;  Patient pass out, today while in the bathroom, she was sitting at time of episode. She became unresponsive. She was off monitor.  -She regain consciousness. She denies chest pain or dyspnea. She was feeling dizzy.  -cardiology consulted due to prior history of WPW and suycope event. Cardiology think patient had vaso-vagal syncope.  -on IV fluids.  -D dimer positive, doppler and VQ scan negative for blood clot.   History of WPW; EKG; reviewed EKG with cardiology on called. Patient PR is not short. He recommend follow EKG in am.   Hemorrhoids; start annusol.   Discharge Diagnoses:  Principal Problem:   Sepsis (HCC) Active Problems:   Wolff-Parkinson-White (WPW) pattern   Iron deficiency anemia due to chronic blood loss   Syncope    Discharge Instructions  Discharge Instructions    Diet - low sodium heart healthy   Complete by:  As directed    Increase activity slowly   Complete by:  As directed      Allergies as of 01/22/2018      Reactions   Other    PORK-   Latex Rash      Medication List    TAKE these medications   acetaminophen 325 MG tablet Commonly known as:  TYLENOL Take 2 tablets (650 mg total) by  mouth every 6 (six) hours as needed for mild pain (or Fever >/= 101).   doxycycline 50 MG capsule Commonly known as:  VIBRAMYCIN Take 2 capsules (100 mg total) by mouth 2 (two) times daily for 7 days.   HYDROcodone-acetaminophen 5-325 MG tablet Commonly known as:  NORCO/VICODIN Take 1-2 tablets by mouth every 6 (six) hours as needed for moderate pain.   polyethylene glycol packet Commonly known  as:  MIRALAX / GLYCOLAX Take 17 g by mouth daily. Start taking on:  01/23/2018      Follow-up Information    Glenna Fellows, MD. Schedule an appointment as soon as possible for a visit in 1 week.   Specialty:  Plastic Surgery Contact information: [address] [place] [address] [phone]        Marinus Maw, MD Follow up on 02/18/2018.   Specialty:  Cardiology Why:  9:00AM Contact information: [address] [place] [address] [phone]          Allergies  Allergen Reactions  . Other     PORK-  . Latex Rash    Consultations:  Dr Leta Baptist.    Procedures/Studies: Dg Chest 2 View  Result Date: 01/21/2018 CLINICAL DATA:  Recent episode of sepsis with DVT during pregnancy. Chest radiograph prior to VQ scan EXAM: CHEST - 2 VIEW COMPARISON:  01/18/2018; 12/23/2015 FINDINGS: Grossly unchanged cardiac silhouette and mediastinal contours given slightly reduced lung volumes. Minimal perihilar linear heterogeneous opacities favored to represent atelectasis. No discrete focal airspace opacities. No pleural effusion or pneumothorax. No evidence of edema. No acute osseous abnormalities. IMPRESSION: Perihilar atelectasis without superimposed acute cardiopulmonary disease. Electronically Signed   By: Simonne Come M.D.   On: 01/21/2018 14:26   Ct Abdomen Pelvis W Contrast  Result Date: 01/19/2018 CLINICAL DATA:  History of recent cosmetic surgery in [place] with pain and tachycardia as well as fevers EXAM: CT ABDOMEN AND PELVIS WITH CONTRAST TECHNIQUE: Multidetector CT imaging of the abdomen and pelvis was performed using the standard protocol following bolus administration of intravenous contrast. CONTRAST:  OMNIPAQUE IOHEXOL 300 MG/ML  SOLN COMPARISON:  None. FINDINGS: Lower chest: Lung bases demonstrate mild bibasilar atelectasis left greater than right. No sizable effusion is seen. There are changes consistent with bilateral  breast augmentation consistent with the given clinical history. Hepatobiliary: No focal liver abnormality is seen. No gallstones, gallbladder wall thickening, or biliary dilatation. Pancreas: Unremarkable. No pancreatic ductal dilatation or surrounding inflammatory changes. Spleen: Normal in size without focal abnormality. Adrenals/Urinary Tract: Adrenal glands are unremarkable. Kidneys are normal, without renal calculi, focal lesion, or hydronephrosis. Bladder is unremarkable. Stomach/Bowel: The appendix is not well visualized. No inflammatory changes to suggest appendicitis are noted. No obstructive or inflammatory changes are seen. Vascular/Lymphatic: No significant vascular findings are present. No enlarged abdominal or pelvic lymph nodes. Reproductive: Uterus and bilateral adnexa are unremarkable. Other: There are diffuse inflammatory changes identified in the subcutaneous tissues in the area of recent surgical intervention. In the anterior left lower abdominal wall, there is a fluid collection which measures approximately 1.8 by 7.8 cm in greatest AP and transverse dimension. It extends for approximately 12 cm in the craniocaudad projection. This is consistent with postoperative seroma. Additionally in the posterior abdominal wall just above the iliac crests there is a 15 by 2.5 cm fluid collection consistent with postoperative seroma. This extends for at least 9 cm in craniocaudad projection. Diffuse subcutaneous edema is noted throughout the surgical beds. Smaller  irregular fluid collections are noted in the buttock regions bilaterally. Musculoskeletal: No acute or significant osseous findings. IMPRESSION: There are postoperative changes consistent with the patient's given clinical history of breast augmentation, liposuction and tummy tuck. There is significant subcutaneous edema identified as well as multiple fluid collections consistent with postoperative seromas. There is likely some degree of underlying  superinfection given the patient's clinical history. Electronically Signed   By: Alcide CleverMark  Lukens M.D.   On: 01/19/2018 02:59   Nm Pulmonary Perf And Vent  Result Date: 01/21/2018 CLINICAL DATA:  Chest pain. Recent episode of sepsis and DVT during pregnancy. Evaluate for pulmonary embolism. EXAM: NUCLEAR MEDICINE VENTILATION - PERFUSION LUNG SCAN TECHNIQUE: Ventilation images were obtained in multiple projections using inhaled aerosol Tc-4764m DTPA. Perfusion images were obtained in multiple projections after intravenous injection of Tc-6664m-MAA. RADIOPHARMACEUTICALS:  31.4 mCi of Tc-1964m DTPA aerosol inhalation and 4.2 mCi Tc7964m-MAA IV COMPARISON:  Chest radiograph-earlier same day; 01/18/2018; 12/23/2015 FINDINGS: Review of chest radiograph performed earlier same day demonstrates unchanged cardiac silhouette and mediastinal contours. Minimal perihilar opacities favored to represent atelectasis. No pleural effusion or pneumothorax. No evidence of edema. Ventilation: Ventilatory images demonstrates minimal clumping about the bilateral pulmonary hila. There are no discrete areas of non ventilation. Perfusion: Perfusion images demonstrate relative homogeneous distribution of injected radiotracer throughout the bilateral pulmonary parenchyma without discrete mismatched segmental filling defect to suggest pulmonary embolism. IMPRESSION: Pulmonary embolism absent (very low probability of pulmonary embolism). Electronically Signed   By: Simonne ComeJohn  Watts M.D.   On: 01/21/2018 14:25   Dg Chest Portable 1 View  Result Date: 01/19/2018 CLINICAL DATA:  Weight reduction surgeries. Now septic and short of breath. EXAM: PORTABLE CHEST 1 VIEW COMPARISON:  12/23/2015 FINDINGS: The heart size and mediastinal contours are within normal limits. Both lungs are clear. The visualized skeletal structures are unremarkable. IMPRESSION: No active disease. Electronically Signed   By: Burman NievesWilliam  Stevens M.D.   On: 01/19/2018 00:08      Subjective: Feeling better, she has been out of bed, denies pain.    Discharge Exam: Vitals:   01/22/18 0547 01/22/18 1158  BP: 115/71 119/63  Pulse: 77 72  Resp: 18 18  Temp: 97.8 F (36.6 C) 98.1 F (36.7 C)  SpO2: 98% 98%   Vitals:   01/21/18 1603 01/21/18 2059 01/22/18 0547 01/22/18 1158  BP: 126/78 130/78 115/71 119/63  Pulse: 95 92 77 72  Resp: 18 18 18 18   Temp: 99.2 F (37.3 C) 99.1 F (37.3 C) 97.8 F (36.6 C) 98.1 F (36.7 C)  TempSrc: Oral Oral Oral Oral  SpO2: 99% 100% 98% 98%  Weight:      Height:        General: Pt is alert, awake, not in acute distress Cardiovascular: RRR, S1/S2 +, no rubs, no gallops Respiratory: CTA bilaterally, no wheezing, no rhonchi Abdominal: Soft, NT, ND, bowel sounds + wound cover.  Extremities: no edema, no cyanosis    The results of significant diagnostics from this hospitalization (including imaging, microbiology, ancillary and laboratory) are listed below for reference.     Microbiology: Recent Results (from the past 240 hour(s))  Culture, blood (Routine x 2)     Status: None (Preliminary result)   Collection Time: 01/18/18 11:40 PM  Result Value Ref Range Status   Specimen Description BLOOD LEFT HAND  Final   Special Requests   Final    BOTTLES DRAWN AEROBIC AND ANAEROBIC Blood Culture adequate volume   Culture   Final  NO GROWTH 2 DAYS Performed at [facility], [address] [place], [place] [address]    Report Status PENDING  Incomplete  Culture, blood (Routine x 2)     Status: None (Preliminary result)   Collection Time: 01/19/18  1:10 AM  Result Value Ref Range Status   Specimen Description BLOOD LEFT HAND  Final   Special Requests   Final    BOTTLES DRAWN AEROBIC AND ANAEROBIC Blood Culture adequate volume   Culture   Final    NO GROWTH 2 DAYS Performed at [facility], [address] [place], [place] [address]    Report Status PENDING  Incomplete  Aerobic/Anaerobic Culture  (surgical/deep wound)     Status: None (Preliminary result)   Collection Time: 01/19/18  8:00 PM  Result Value Ref Range Status   Specimen Description TISSUE BACK SEROMA  Final   Special Requests SPECIMEN A  Final   Gram Stain   Final    MODERATE WBC PRESENT,BOTH PMN AND MONONUCLEAR FEW GRAM POSITIVE COCCI Performed at [facility], [address] [place], [place] [address]    Culture   Final    FEW STAPHYLOCOCCUS AUREUS NO ANAEROBES ISOLATED; CULTURE IN PROGRESS FOR 5 DAYS    Report Status PENDING  Incomplete   Organism ID, Bacteria STAPHYLOCOCCUS AUREUS  Final      Susceptibility   Staphylococcus aureus - MIC*    CIPROFLOXACIN <=0.5 SENSITIVE Sensitive     ERYTHROMYCIN >=8 RESISTANT Resistant     GENTAMICIN <=0.5 SENSITIVE Sensitive     OXACILLIN 0.5 SENSITIVE Sensitive     TETRACYCLINE <=1 SENSITIVE Sensitive     VANCOMYCIN <=0.5 SENSITIVE Sensitive     TRIMETH/SULFA <=10 SENSITIVE Sensitive     CLINDAMYCIN RESISTANT Resistant     RIFAMPIN <=0.5 SENSITIVE Sensitive     Inducible Clindamycin POSITIVE Resistant     * FEW STAPHYLOCOCCUS AUREUS  Aerobic/Anaerobic Culture (surgical/deep wound)     Status: None (Preliminary result)   Collection Time: 01/19/18  8:00 PM  Result Value Ref Range Status   Specimen Description TISSUE ABDOMINAL SEROMA  Final   Special Requests SPECIMEN B  Final   Gram Stain   Final    FEW WBC PRESENT,BOTH PMN AND MONONUCLEAR NO ORGANISMS SEEN    Culture   Final    CULTURE REINCUBATED FOR BETTER GROWTH Performed at [facility], [address] [place], [place] [address]    Report Status PENDING  Incomplete     Labs: BNP (last 3 results) No results for input(s): BNP in the last 8760 hours. Basic Metabolic Panel: Recent Labs  Lab 01/18/18 2340 01/19/18 0532 01/20/18 0757 01/20/18 1323 01/22/18 0613  NA 131* 134* 136 136 142  K 3.9 4.0 3.5 3.5 3.9  CL 99 101 101 105 109  CO2 22 24 25 22 25   GLUCOSE 121* 121* 111* 119* 113*   BUN 5* <5* <5* <5* <5*  CREATININE 0.74 0.78 0.94 0.82 0.79  CALCIUM 8.3* 8.5* 8.6* 7.9* 8.6*   Liver Function Tests: Recent Labs  Lab 01/18/18 2340 01/19/18 0532  AST 27 33  ALT 25 28  ALKPHOS 63 75  BILITOT 0.5 0.3  PROT 6.3* 6.2*  ALBUMIN 2.5* 2.3*   No results for input(s): LIPASE, AMYLASE in the last 168 hours. No results for input(s): AMMONIA in the last 168 hours. CBC: Recent Labs  Lab 01/18/18 2340 01/19/18 0532 01/20/18 0757 01/20/18 1323 01/22/18 0613  WBC 15.1*  17.9* 18.0* 18.3* 12.9*  NEUTROABS 11.1* 13.5*  --   --   --   HGB 10.2* 10.3* 9.8* 9.1* 9.2*  HCT 33.5* 33.4* 32.3* 29.8* 31.3*  MCV 87.7 88.1 88.0 88.4 89.7  PLT 402* 374 370 341 372   Cardiac Enzymes: Recent Labs  Lab 01/20/18 1323  TROPONINI 0.27*   BNP: Invalid input(s): POCBNP CBG: Recent Labs  Lab 01/20/18 1225  GLUCAP 123*   D-Dimer Recent Labs    01/20/18 1624  DDIMER 2.45*   Hgb A1c No results for input(s): HGBA1C in the last 72 hours. Lipid Profile No results for input(s): CHOL, HDL, LDLCALC, TRIG, CHOLHDL, LDLDIRECT in the last 72 hours. Thyroid function studies No results for input(s): TSH, T4TOTAL, T3FREE, THYROIDAB in the last 72 hours.  Invalid input(s): FREET3 Anemia work up No results for input(s): VITAMINB12, FOLATE, FERRITIN, TIBC, IRON, RETICCTPCT in the last 72 hours. Urinalysis    Component Value Date/Time   COLORURINE AMBER (A) 01/19/2018 0145   APPEARANCEUR HAZY (A) 01/19/2018 0145   LABSPEC 1.019 01/19/2018 0145   PHURINE 7.0 01/19/2018 0145   GLUCOSEU NEGATIVE 01/19/2018 0145   HGBUR NEGATIVE 01/19/2018 0145   BILIRUBINUR NEGATIVE 01/19/2018 0145   KETONESUR NEGATIVE 01/19/2018 0145   PROTEINUR 30 (A) 01/19/2018 0145   UROBILINOGEN 0.2 02/13/2015 1100   NITRITE NEGATIVE 01/19/2018 0145   LEUKOCYTESUR NEGATIVE 01/19/2018 0145   Sepsis Labs Invalid input(s): PROCALCITONIN,  WBC,  LACTICIDVEN Microbiology Recent Results (from the past 240  hour(s))  Culture, blood (Routine x 2)     Status: None (Preliminary result)   Collection Time: 01/18/18 11:40 PM  Result Value Ref Range Status   Specimen Description BLOOD LEFT HAND  Final   Special Requests   Final    BOTTLES DRAWN AEROBIC AND ANAEROBIC Blood Culture adequate volume   Culture   Final    NO GROWTH 2 DAYS Performed at [facility], [address] [place], [place] [address]    Report Status PENDING  Incomplete  Culture, blood (Routine x 2)     Status: None (Preliminary result)   Collection Time: 01/19/18  1:10 AM  Result Value Ref Range Status   Specimen Description BLOOD LEFT HAND  Final   Special Requests   Final    BOTTLES DRAWN AEROBIC AND ANAEROBIC Blood Culture adequate volume   Culture   Final    NO GROWTH 2 DAYS Performed at [facility], [address], [place], [place] [address]    Report Status PENDING  Incomplete  Aerobic/Anaerobic Culture (surgical/deep wound)     Status: None (Preliminary result)   Collection Time: 01/19/18  8:00 PM  Result Value Ref Range Status   Specimen Description TISSUE BACK SEROMA  Final   Special Requests SPECIMEN A  Final   Gram Stain   Final    MODERATE WBC PRESENT,BOTH PMN AND MONONUCLEAR FEW GRAM POSITIVE COCCI Performed at [facility], [address] [place], [place] [address]    Culture   Final    FEW STAPHYLOCOCCUS AUREUS NO ANAEROBES ISOLATED; CULTURE IN PROGRESS FOR 5 DAYS    Report Status PENDING  Incomplete   Organism ID, Bacteria STAPHYLOCOCCUS AUREUS  Final      Susceptibility   Staphylococcus aureus - MIC*    CIPROFLOXACIN <=0.5 SENSITIVE Sensitive     ERYTHROMYCIN >=8 RESISTANT Resistant     GENTAMICIN <=0.5 SENSITIVE Sensitive     OXACILLIN 0.5 SENSITIVE Sensitive  TETRACYCLINE <=1 SENSITIVE Sensitive     VANCOMYCIN <=0.5 SENSITIVE Sensitive     TRIMETH/SULFA <=10 SENSITIVE Sensitive     CLINDAMYCIN RESISTANT Resistant     RIFAMPIN <=0.5 SENSITIVE Sensitive     Inducible  Clindamycin POSITIVE Resistant     * FEW STAPHYLOCOCCUS AUREUS  Aerobic/Anaerobic Culture (surgical/deep wound)     Status: None (Preliminary result)   Collection Time: 01/19/18  8:00 PM  Result Value Ref Range Status   Specimen Description TISSUE ABDOMINAL SEROMA  Final   Special Requests SPECIMEN B  Final   Gram Stain   Final    FEW WBC PRESENT,BOTH PMN AND MONONUCLEAR NO ORGANISMS SEEN    Culture   Final    CULTURE REINCUBATED FOR BETTER GROWTH Performed at [facility], [address], [place], [place] [address]    Report Status PENDING  Incomplete     Time coordinating discharge: 35 minutes.   SIGNED:   Alba Cory, MD  [facility] 01/22/2018, 1:16 PM Pager [phone]  If 7PM-7AM, please contact night-coverage [email or web address] Password [email or web address]

## 2018-01-22 NOTE — Care Management Note (Signed)
Case Management Note  Patient Details  Name: Erin Robinsonsshley A Miley-Muhammad MRN: [id number] Date of Birth: 09/05/1985  Subjective/Objective:      Pt from home with children for sepsis s/p plastic surgery.  Pt plans to return to mother's home at [address] [place], [place] [address].  Pt states [facility] has [facility] who is a new provider there. Pt wants to be contacted at [phone].  Mother will assist patient with dressing changes.            Action/Plan: Offered choice to pt.  Patient has no preference of HH agency.  [facility]  Dressing change orders are in comments of HH order.  Expected Discharge Date:  01/22/18               Expected Discharge Plan:  Home w Home Health Services  In-House Referral:  NA  Discharge planning Services  CM Consult  Post Acute Care Choice:  Home Health Choice offered to:  Patient  DME Arranged:  N/A DME Agency:  NA  HH Arranged:  RN HH Agency:  [facility]  Status of Service:  Completed, signed off  If discussed at Long Length of Stay Meetings, dates discussed:    Additional Comments:  Deveron Furlongshley  Keni Elison, RN 01/22/2018, 1:24 PM

## 2018-01-24 LAB — CULTURE, BLOOD (ROUTINE X 2)
Culture: NO GROWTH
Culture: NO GROWTH
Special Requests: ADEQUATE
Special Requests: ADEQUATE

## 2018-01-24 LAB — AEROBIC/ANAEROBIC CULTURE (SURGICAL/DEEP WOUND)

## 2018-01-24 LAB — AEROBIC/ANAEROBIC CULTURE W GRAM STAIN (SURGICAL/DEEP WOUND)

## 2018-01-27 ENCOUNTER — Encounter: Payer: Self-pay | Admitting: Internal Medicine

## 2018-02-18 ENCOUNTER — Encounter: Payer: Self-pay | Admitting: Internal Medicine

## 2018-02-18 ENCOUNTER — Ambulatory Visit (INDEPENDENT_AMBULATORY_CARE_PROVIDER_SITE_OTHER): Payer: Medicaid Other | Admitting: Internal Medicine

## 2018-02-18 VITALS — BP 112/78 | HR 83 | Ht 70.0 in | Wt 228.6 lb

## 2018-02-18 DIAGNOSIS — Z8679 Personal history of other diseases of the circulatory system: Secondary | ICD-10-CM

## 2018-02-18 DIAGNOSIS — R55 Syncope and collapse: Secondary | ICD-10-CM | POA: Diagnosis not present

## 2018-02-18 NOTE — Progress Notes (Signed)
HPI [name] returns today for followup of WPW syndrome. She is a pleasant 32 yo woman with WPW who has undergone extensive plastic surgery and developed sepsis. She developed atrial fib with an RVR with pre-excitation. She has never had any symptoms related to her WPW in the past. She now feels better. She has not had additional symptoms.  Allergies  Allergen Reactions  . Other     PORK-  . Latex Rash     No current outpatient medications on file.   No current facility-administered medications for this visit.      Past Medical History:  Diagnosis Date  . Cervical abnormality affecting pregnancy, antepartum 08/25/2015  . DVT (deep vein thrombosis) in pregnancy [facility](HCC)    dx clot in right arm in Jan 2017.  Been off Lovenox since Friday 08/23/15   . Headache    otc med prn  . Heartburn in pregnancy   . No pertinent past medical history   . Short cervix with cervical cerclage in second trimester, antepartum 09/06/2015    ROS:   All systems reviewed and negative except as noted in the HPI.   Past Surgical History:  Procedure Laterality Date  . abdominoplasty, augmentation mammaplasty, liposuction back thighs arms  01/01/2018   [place]  . CERVICAL CERCLAGE N/A 09/06/2015   Procedure: CERCLAGE CERVICAL;  Surgeon: Sherian ReinJody [name], MD;  Location: [facility];  Service: Gynecology;  Laterality: N/A;  . CESAREAN SECTION N/A 11/06/2015   Procedure: CESAREAN SECTION;  Surgeon: Huel CoteKathy Richardson, MD;  [facility];  Service: Obstetrics;  Laterality: N/A;  . EXCISION MASS ABDOMINAL N/A 01/19/2018   Procedure: incision and drainage of the abdomen;  Surgeon: Glenna Fellowshimmappa, Brinda, MD;  Location: [facility];  Service: Plastics;  Laterality: N/A;  . INCISION AND DRAINAGE OF WOUND N/A 01/19/2018   Procedure: icision and drainage of back;  Surgeon: Glenna Fellowshimmappa, Brinda, MD;  Location: [facility] OR;  Service: Plastics;  Laterality: N/A;  . INDUCED ABORTION    .  pediatric dental surgery    . WISDOM TOOTH EXTRACTION       Family History  Problem Relation Age of Onset  . Clotting disorder Neg Hx      Social History   Socioeconomic History  . Marital status: Single    Spouse name: Not on file  . Number of children: Not on file  . Years of education: Not on file  . Highest education level: Not on file  Occupational History  . Not on file  Social Needs  . Financial resource strain: Not on file  . Food insecurity:    Worry: Not on file    Inability: Not on file  . Transportation needs:    Medical: Not on file    Non-medical: Not on file  Tobacco Use  . Smoking status: Former Smoker    Packs/day: 0.50    Years: 7.00    Pack years: 3.50    Types: Cigarettes    Last attempt to quit: 06/08/2014    Years since quitting: 3.7  . Smokeless tobacco: Never Used  Substance and Sexual Activity  . Alcohol use: Yes    Comment: occasional wine - none with pregnancy  . Drug use: No  . Sexual activity: Never    Birth control/protection: IUD    Comment: approx [redacted] wks gestation   Lifestyle  . Physical activity:    Days per week: Not on file    Minutes per session: Not  on file  . Stress: Not on file  Relationships  . Social connections:    Talks on phone: Not on file    Gets together: Not on file    Attends religious service: Not on file    Active member of club or organization: Not on file    Attends meetings of clubs or organizations: Not on file    Relationship status: Not on file  . Intimate partner violence:    Fear of current or ex partner: Not on file    Emotionally abused: Not on file    Physically abused: Not on file    Forced sexual activity: Not on file  Other Topics Concern  . Not on file  Social History Narrative  . Not on file     BP 112/78   Pulse 83   Ht 5\' 10"  (1.778 m)   Wt 228 lb 9.6 oz (103.7 kg)   SpO2 98%   BMI 32.80 kg/m   Physical Exam:  Well appearing NAD HEENT: Unremarkable Neck:  No JVD, no  thyromegally Lymphatics:  No adenopathy Back:  No CVA tenderness Lungs:  Clear with no wheezes HEART:  Regular rate rhythm, no murmurs, no rubs, no clicks Abd:  soft, positive bowel sounds, no organomegally, no rebound, no guarding Ext:  2 plus pulses, no edema, no cyanosis, no clubbing Skin:  No rashes no nodules Neuro:  CN II through XII intact, motor grossly intact  EKG - NSR with minimal pre-excitation   Assess/Plan: 1. WpW - I have discussed the treatment options with the patient. Although she has not had much in the way of symptoms, I discussed the treatment options with the patient and recommended EPS/RFA of WpW. She will call us if she would like to proceed with ablation  Leonia Reeves.D.

## 2018-02-18 NOTE — Patient Instructions (Addendum)
Medication Instructions:  Your physician recommends that you continue on your current medications as directed. Please refer to the Current Medication list given to you today.  Labwork: None ordered.  Testing/Procedures: Your physician has recommended that you have an ablation. Catheter ablation is a medical procedure used to treat some cardiac arrhythmias (irregular heartbeats). During catheter ablation, a long, thin, flexible tube is put into a blood vessel in your groin (upper thigh), or neck. This tube is called an ablation catheter. It is then guided to your heart through the blood vessel. Radio frequency waves destroy small areas of heart tissue where abnormal heartbeats may cause an arrhythmia to start. Please see the instruction sheet given to you today.  Any Other Special Instructions Will Be Listed Below (If Applicable).  If you need a refill on your cardiac medications before your next appointment, please call your pharmacy.   The following days are available for procedures:   If you decide on a day please give me a call:  Dierdre Highman [phone]  Ablation days:  September 23 October 4, 7, 28 and 29    Cardiac Ablation Cardiac ablation is a procedure to disable (ablate) a small amount of heart tissue in very specific places. The heart has many electrical connections. Sometimes these connections are abnormal and can cause the heart to beat very fast or irregularly. Ablating some of the problem areas can improve the heart rhythm or return it to normal. Ablation may be done for people who:  Have Wolff-Parkinson-White syndrome.  Have fast heart rhythms (tachycardia).  Have taken medicines for an abnormal heart rhythm (arrhythmia) that were not effective or caused side effects.  Have a high-risk heartbeat that may be life-threatening.  During the procedure, a small incision is made in the neck or the groin, and a long, thin, flexible tube (catheter) is inserted into the  incision and moved to the heart. Small devices (electrodes) on the tip of the catheter will send out electrical currents. A type of X-ray (fluoroscopy) will be used to help guide the catheter and to provide images of the heart. Tell a health care provider about:  Any allergies you have.  All medicines you are taking, including vitamins, herbs, eye drops, creams, and over-the-counter medicines.  Any problems you or family members have had with anesthetic medicines.  Any blood disorders you have.  Any surgeries you have had.  Any medical conditions you have, such as kidney failure.  Whether you are pregnant or may be pregnant. What are the risks? Generally, this is a safe procedure. However, problems may occur, including:  Infection.  Bruising and bleeding at the catheter insertion site.  Bleeding into the chest, especially into the sac that surrounds the heart. This is a serious complication.  Stroke or blood clots.  Damage to other structures or organs.  Allergic reaction to medicines or dyes.  Need for a permanent pacemaker if the normal electrical system is damaged. A pacemaker is a small computer that sends electrical signals to the heart and helps your heart beat normally.  The procedure not being fully effective. This may not be recognized until months later. Repeat ablation procedures are sometimes required.  What happens before the procedure?  Follow instructions from your health care provider about eating or drinking restrictions.  Ask your health care provider about: ? Changing or stopping your regular medicines. This is especially important if you are taking diabetes medicines or blood thinners. ? Taking medicines such as aspirin and ibuprofen. These  medicines can thin your blood. Do not take these medicines before your procedure if your health care provider instructs you not to.  Plan to have someone take you home from the hospital or clinic.  If you will be  going home right after the procedure, plan to have someone with you for 24 hours. What happens during the procedure?  To lower your risk of infection: ? Your health care team will wash or sanitize their hands. ? Your skin will be washed with soap. ? Hair may be removed from the incision area.  An IV tube will be inserted into one of your veins.  You will be given a medicine to help you relax (sedative).  The skin on your neck or groin will be numbed.  An incision will be made in your neck or your groin.  A needle will be inserted through the incision and into a large vein in your neck or groin.  A catheter will be inserted into the needle and moved to your heart.  Dye may be injected through the catheter to help your surgeon see the area of the heart that needs treatment.  Electrical currents will be sent from the catheter to ablate heart tissue in desired areas. There are three types of energy that may be used to ablate heart tissue: ? Heat (radiofrequency energy). ? Laser energy. ? Extreme cold (cryoablation).  When the necessary tissue has been ablated, the catheter will be removed.  Pressure will be held on the catheter insertion area to prevent excessive bleeding.  A bandage (dressing) will be placed over the catheter insertion area. The procedure may vary among health care providers and hospitals. What happens after the procedure?  Your blood pressure, heart rate, breathing rate, and blood oxygen level will be monitored until the medicines you were given have worn off.  Your catheter insertion area will be monitored for bleeding. You will need to lie still for a few hours to ensure that you do not bleed from the catheter insertion area.  Do not drive for 24 hours or as long as directed by your health care provider. Summary  Cardiac ablation is a procedure to disable (ablate) a small amount of heart tissue in very specific places. Ablating some of the problem areas can  improve the heart rhythm or return it to normal.  During the procedure, electrical currents will be sent from the catheter to ablate heart tissue in desired areas. This information is not intended to replace advice given to you by your health care provider. Make sure you discuss any questions you have with your health care provider. Document Released: 10/11/2008 Document Revised: 04/13/2016 Document Reviewed: 04/13/2016 Elsevier Interactive Patient Education  [employer]2018 [employer]

## 2018-03-15 ENCOUNTER — Other Ambulatory Visit: Payer: Self-pay | Admitting: Physician Assistant

## 2018-03-15 DIAGNOSIS — N6320 Unspecified lump in the left breast, unspecified quadrant: Secondary | ICD-10-CM

## 2018-03-23 ENCOUNTER — Ambulatory Visit
Admission: RE | Admit: 2018-03-23 | Discharge: 2018-03-23 | Disposition: A | Discharge status: Home / Self Care | Payer: Medicaid Other | Source: Ambulatory Visit | Attending: Physician Assistant | Admitting: Physician Assistant

## 2018-03-23 DIAGNOSIS — N6320 Unspecified lump in the left breast, unspecified quadrant: Secondary | ICD-10-CM

## 2018-03-24 NOTE — Progress Notes (Signed)
Marland Kitchen    HEMATOLOGY/ONCOLOGY CONSULTATION NOTE  Date of Service:  03/25/18    Patient Care Team: [facility], [facility] as PCP - General (Family Medicine)  CHIEF COMPLAINTS/PURPOSE OF CONSULTATION:   Iron deficiency anemia  HISTORY OF PRESENTING ILLNESS:   Erin Boyd is a wonderful 32 y.o. female who has been referred to [place] by Dr Augusto Gamble Carlean Jews MD for evaluation and management of newly diagnosed LUE DVT during pregnancy.  Patient is left-handed and presented with left arm soreness around 26 June 2015 while she was about [redacted] weeks pregnant. She underwent an ultrasound which showed an occlusive deep venous thrombosis within the left basilic vein extending from the distal upper arm to the proximal forearm, 2-3 cm from its junction with the brachial vein. Patient reports no previous history of venous thromboembolism.  No family history of venous thromboembolism. Denies having any IV lines in the left arm.  Has had a pregnancy 8 years ago without any VTE.  She has used hormonal contraception for about 23yrs in the past without any complications.  She notes that she has had one abortion and one miscarriage in this last year prior to her current pregnancy.  She notes that she had started oral contraceptive pills in September 2016 and despite that became pregnant soon after that.   Her GYN doctor Dr. Ellyn Hack started on Lovenox 100 mg twice a day for her new DVT.  She notes that her left arm soreness has since resolved. She had a thrombophilia workup that showed a negative antiphospholipid antibody screen including beta-2 glycoprotein, anticardiolipin antibody and lupus anticoagulant. Factor IV Leiden mutation negative.  Prothrombin gene mutation not done.  Antithrombin III within normal limits.  Normal hemoglobin electrophoresis.  Other acquired risk factors: Patient was a former smokerhalf a pack per day.  Quit 2 years ago.  Obesity BMI nearly 34.  Was on oral  contraceptive pills when she got pregnant.  Is currently [redacted] weeks pregnant.  No issues with bleeding on the Lovenox.  She has never had issues with VTE in the past.  Interval History:   Erin Boyd returns today for management and evaluation of her Iron-deficiency anemia. The patient's last visit with [place] was on 12/24/17. The pt reports that she is doing well overall.   The pt developed sepsis after her previous surgery in [place]. She notes that she has stopped her Para-guard in the interim and notes that her vaginal bleeding has reduced. She notes that her energy levels have been very good and she has not had any subsequent concerns for infections. She also denies any light headedness or dizziness.   Lab results today (03/25/18) of CBC w/diff is as follows: all values are WNL except for ANC at 1.4k.hgb up from 9.2 to 13.8. 03/25/18 Ferritin is 26 up from 9  On review of systems, pt reports moving her bowels well, normal and regular periods, good energy levels, and denies concern for current infection, concerns with abnormal bleeding, light headedness, dizziness, fevers, leg swelling, and any other symptoms.   MEDICAL HISTORY:  Past Medical History:  Diagnosis Date  . Cervical abnormality affecting pregnancy, antepartum 08/25/2015  . DVT (deep vein thrombosis) in pregnancy    dx clot in right arm in Jan 2017.  Been off Lovenox since Friday 08/23/15   . Headache    otc med prn  . Heartburn in pregnancy   . No pertinent past medical history   . Short cervix with  cervical cerclage in second trimester, antepartum 09/06/2015   Obesity .Body mass index is 32.28 kg/m.   SURGICAL HISTORY: Past Surgical History:  Procedure Laterality Date  . abdominoplasty, augmentation mammaplasty, liposuction back thighs arms  01/01/2018   [place]  . AUGMENTATION MAMMAPLASTY    . CERVICAL CERCLAGE N/A 09/06/2015   Procedure: CERCLAGE CERVICAL;  Surgeon: Sherian Rein, MD;   Location: [facility];  Service: Gynecology;  Laterality: N/A;  . CESAREAN SECTION N/A 11/06/2015   Procedure: CESAREAN SECTION;  Surgeon: Huel Cote, MD;  Location: [facility] BIRTHING SUITES;  Service: Obstetrics;  Laterality: N/A;  . EXCISION MASS ABDOMINAL N/A 01/19/2018   Procedure: incision and drainage of the abdomen;  Surgeon: Glenna Fellows, MD;  Location: [facility] OR;  Service: Plastics;  Laterality: N/A;  . INCISION AND DRAINAGE OF WOUND N/A 01/19/2018   Procedure: icision and drainage of back;  Surgeon: Glenna Fellows, MD;  Location: [facility] OR;  Service: Plastics;  Laterality: N/A;  . INDUCED ABORTION    . pediatric dental surgery    . WISDOM TOOTH EXTRACTION      SOCIAL HISTORY: Social History   Socioeconomic History  . Marital status: Single    Spouse name: Not on file  . Number of children: Not on file  . Years of education: Not on file  . Highest education level: Not on file  Occupational History  . Not on file  Social Needs  . Financial resource strain: Not on file  . Food insecurity:    Worry: Not on file    Inability: Not on file  . Transportation needs:    Medical: Not on file    Non-medical: Not on file  Tobacco Use  . Smoking status: Former Smoker    Packs/day: 0.50    Years: 7.00    Pack years: 3.50    Types: Cigarettes    Last attempt to quit: 06/08/2014    Years since quitting: 3.7  . Smokeless tobacco: Never Used  Substance and Sexual Activity  . Alcohol use: Yes    Comment: occasional wine - none with pregnancy  . Drug use: No  . Sexual activity: Never    Birth control/protection: IUD    Comment: approx [redacted] wks gestation   Lifestyle  . Physical activity:    Days per week: Not on file    Minutes per session: Not on file  . Stress: Not on file  Relationships  . Social connections:    Talks on phone: Not on file    Gets together: Not on file    Attends religious service: Not on file    Active member of club or organization: Not on file    Attends  meetings of clubs or organizations: Not on file    Relationship status: Not on file  . Intimate partner violence:    Fear of current or ex partner: Not on file    Emotionally abused: Not on file    Physically abused: Not on file    Forced sexual activity: Not on file  Other Topics Concern  . Not on file  Social History Narrative  . Not on file    FAMILY HISTORY: Family History  Problem Relation Age of Onset  . Clotting disorder Neg Hx     ALLERGIES:  is allergic to other and latex.  MEDICATIONS:  No current outpatient medications on file.   No current facility-administered medications for this visit.     REVIEW OF SYSTEMS:    A  10+ POINT REVIEW OF SYSTEMS WAS OBTAINED including neurology, dermatology, psychiatry, cardiac, respiratory, lymph, extremities, GI, GU, Musculoskeletal, constitutional, breasts, reproductive, HEENT.  All pertinent positives are noted in the HPI.  All others are negative.   PHYSICAL EXAMINATION: ECOG PERFORMANCE STATUS: 1 - Symptomatic but completely ambulatory  . Vitals:   03/25/18 1034  BP: 131/89  Pulse: 65  Resp: 18  Temp: 98.3 F (36.8 C)  SpO2: 100%   Filed Weights   03/25/18 1034  Weight: 225 lb (102.1 kg)   .Body mass index is 32.28 kg/m.  GENERAL:alert, in no acute distress and comfortable SKIN: no acute rashes, no significant lesions EYES: conjunctiva are pink and non-injected, sclera anicteric OROPHARYNX: MMM, no exudates, no oropharyngeal erythema or ulceration NECK: supple, no JVD LYMPH:  no palpable lymphadenopathy in the cervical, axillary or inguinal regions LUNGS: clear to auscultation b/l with normal respiratory effort HEART: regular rate & rhythm ABDOMEN:  normoactive bowel sounds , non tender, not distended. No palpable hepatosplenomegaly.  Extremity: no pedal edema PSYCH: alert & oriented x 3 with fluent speech NEURO: no focal motor/sensory deficits   LABORATORY DATA:  I have reviewed the data as  listed  . CBC Latest Ref Rng & Units 03/25/2018 01/22/2018 01/20/2018  WBC 4.0 - 10.5 K/uL 4.6 12.9(H) 18.3(H)  Hemoglobin 12.0 - 15.0 g/dL 16.1 0.9(U) 0.4(V)  Hematocrit 36.0 - 46.0 % 42.6 31.3(L) 29.8(L)  Platelets 150 - 400 K/uL 187 372 341   . CBC    Component Value Date/Time   WBC 4.6 03/25/2018 1014   RBC 5.08 03/25/2018 1014   HGB 13.8 03/25/2018 1014   HCT 42.6 03/25/2018 1014   PLT 187 03/25/2018 1014   MCV 83.9 03/25/2018 1014   MCH 27.2 03/25/2018 1014   MCHC 32.4 03/25/2018 1014   RDW 15.5 03/25/2018 1014   LYMPHSABS 2.8 03/25/2018 1014   MONOABS 0.3 03/25/2018 1014   EOSABS 0.1 03/25/2018 1014   BASOSABS 0.0 03/25/2018 1014    . CMP Latest Ref Rng & Units 01/22/2018 01/20/2018 01/20/2018  Glucose 70 - 99 mg/dL 409(W) 119(J) 478(G)  BUN 6 - 20 mg/dL <9(F) <6(O) <1(H)  Creatinine 0.44 - 1.00 mg/dL 0.86 5.78 4.69  Sodium 135 - 145 mmol/L 142 136 136  Potassium 3.5 - 5.1 mmol/L 3.9 3.5 3.5  Chloride 98 - 111 mmol/L 109 105 101  CO2 22 - 32 mmol/L 25 22 25   Calcium 8.9 - 10.3 mg/dL 6.2(X) 7.9(L) 8.6(L)  Total Protein 6.5 - 8.1 g/dL - - -  Total Bilirubin 0.3 - 1.2 mg/dL - - -  Alkaline Phos 38 - 126 U/L - - -  AST 15 - 41 U/L - - -  ALT 0 - 44 U/L - - -   . Lab Results  Component Value Date   IRON 30 (L) 03/25/2018   TIBC 354 03/25/2018   IRONPCTSAT 8 (L) 03/25/2018   (Iron and TIBC)  Lab Results  Component Value Date   FERRITIN 26 03/25/2018     RADIOGRAPHIC STUDIES: I have personally reviewed the radiological images as listed and agreed with the findings in the report. US Breast Ltd Uni Left Inc Axilla  Result Date: 03/23/2018 CLINICAL DATA:  Patient presents with 2 palpable areas in her left breast upper inner quadrant, posterior depth. The patient had a recent bilateral mammoplasty and implant [place] in [place]. EXAM: DIGITAL DIAGNOSTIC BILATERAL MAMMOGRAM WITH IMPLANTS, CAD AND TOMO ULTRASOUND LEFT BREAST The patient has  retropectoral implants.  Standard and implant displaced views were performed. COMPARISON:  None available. ACR Breast Density Category c: The breast tissue is heterogeneously dense, which may obscure small masses. FINDINGS: Mammographically, there are no suspicious masses, areas of nonsurgical architectural distortion or microcalcifications in either breast. There are radiographically intact subpectoral gel implants. The areas of palpable concern are marked with BBs, which are not seen mammographically due to their far posterior location. On physical exam, no suspicious masses are palpated. Focal nodularity is noted in the left breast upper inner quadrant, posterior depth. Targeted ultrasound is performed, showing hyperechoic circumscribed horizontally oriented superficial area, most consistent with an area of forming fat necrosis in the left breast 10:30 o'clock 10 cm from nipple. The area measures 2.4 cm in greatest dimension. Second similar in appearance area is seen in the left breast 10:30 o'clock 8 cm from the nipple measuring 1.7 cm in greatest dimension. Mammographic images were processed with CAD. IMPRESSION: No mammographic or sonographic evidence of malignancy in either breast. Left breast upper outer quadrant areas of developing fat necrosis correspond to the areas of palpable concern described by the patient. RECOMMENDATION: Further management of patient's areas of palpable concern should be based on clinical grounds. Screening mammogram at age 58 unless there are persistent or intervening clinical concerns. (Code:SM-B-40A) I have discussed the findings and recommendations with the patient. Results were also provided in writing at the conclusion of the visit. If applicable, a reminder letter will be sent to the patient regarding the next appointment. BI-RADS CATEGORY  2: Benign. Electronically Signed   By: Ted Mcalpine M.D.   On: 03/23/2018 10:47   Mm Diag Breast W/implant Tomo  Bilateral  Result Date: 03/23/2018 CLINICAL DATA:  Patient presents with 2 palpable areas in her left breast upper inner quadrant, posterior depth. The patient had a recent bilateral mammoplasty and implant augmentation in July 2019 performed [place]. EXAM: DIGITAL DIAGNOSTIC BILATERAL MAMMOGRAM WITH IMPLANTS, CAD AND TOMO ULTRASOUND LEFT BREAST The patient has retropectoral implants. Standard and implant displaced views were performed. COMPARISON:  None available. ACR Breast Density Category c: The breast tissue is heterogeneously dense, which may obscure small masses. FINDINGS: Mammographically, there are no suspicious masses, areas of nonsurgical architectural distortion or microcalcifications in either breast. There are radiographically intact subpectoral gel implants. The areas of palpable concern are marked with BBs, which are not seen mammographically due to their far posterior location. On physical exam, no suspicious masses are palpated. Focal nodularity is noted in the left breast upper inner quadrant, posterior depth. Targeted ultrasound is performed, showing hyperechoic circumscribed horizontally oriented superficial area, most consistent with an area of forming fat necrosis in the left breast 10:30 o'clock 10 cm from nipple. The area measures 2.4 cm in greatest dimension. Second similar in appearance area is seen in the left breast 10:30 o'clock 8 cm from the nipple measuring 1.7 cm in greatest dimension. Mammographic images were processed with CAD. IMPRESSION: No mammographic or sonographic evidence of malignancy in either breast. Left breast upper outer quadrant areas of developing fat necrosis correspond to the areas of palpable concern described by the patient. RECOMMENDATION: Further management of patient's areas of palpable concern should be based on clinical grounds. Screening mammogram at age 43 unless there are persistent or intervening clinical concerns. (Code:SM-B-40A) I have discussed the  findings and recommendations with the patient. Results were also provided in writing at the conclusion of the visit. If applicable, a reminder letter will be sent to the patient regarding the next appointment.  BI-RADS CATEGORY  2: Benign. Electronically Signed   By: Ted Mcalpine M.D.   On: 03/23/2018 10:47    [place] unilateral left UE 06/26/2015;   IMPRESSION: Occlusive deep venous thrombosis within the left basilic vein. This extends from the distal upper arm to the proximal forearm, 2-3 cm from its junction with the brachial vein.  These results were called by telephone at the time of interpretation on 06/26/2015 at 9:45 pm to Dr. Glynn Octave, who verbally acknowledged these results.   Electronically Signed  By: Roanna Raider M.D.  On: 06/26/2015 21:45   ASSESSMENT & PLAN:   32 y.o. AAF with   1) h/o LUE basilic DVT diagnosed on [place] on 06/26/2015. Obvious risk factor include pregnancy, recent OCP exposure, obesity. She has no Fhx of VTE and has not previously had VTE despite having hormonal trigger events including pregnancy, miscarriage and hormonal contraception. Plan -avoid estrogen containing hormonal contraception  -discussed prophylaxis against VTE -will need lovenox prophylaxis during again subsequent pregnancy and for 6 weeks postpartum  2. Iron deficiency anemia -Discussed pt labwork today, 12/21/17 reviewed . Lab Results  Component Value Date   IRON 30 (L) 03/25/2018   TIBC 354 03/25/2018   IRONPCTSAT 8 (L) 03/25/2018   (Iron and TIBC)  Lab Results  Component Value Date   FERRITIN 26 03/25/2018  PLAN  -Discussed pt labwork today, 03/25/18; HGB has normalized ot 13.8 -03/25/18 Ferritin is 26 -Pt's menorrhagia has reduced since stopping Paraguard -no indication for additional IV Iron at this time. -patient can take OTC PO iron to target ferritin >50 and for maintenance.  RTC with Dr Candise Che as needed   All of the patients questions were answered  with apparent satisfaction. The patient knows to call the clinic with any problems, questions or concerns.  The total time spent in the appt was 15 minutes and more than 50% was on counseling and direct patient cares.    Wyvonnia Lora MD MS AAHIVMS [facility] Hematology/Oncology Physician [facility]  (Office):       [phone] (Work cell):  [phone] (Fax):           [phone]  I, Marcelline Mates, am acting as a scribe for Dr. Candise Che  .I have reviewed the above documentation for accuracy and completeness, and I agree with the above. Johney Maine MD

## 2018-03-25 ENCOUNTER — Encounter: Payer: Self-pay | Admitting: Hematology

## 2018-03-25 ENCOUNTER — Inpatient Hospital Stay: Payer: Medicaid Other | Attending: Hematology

## 2018-03-25 ENCOUNTER — Inpatient Hospital Stay (HOSPITAL_BASED_OUTPATIENT_CLINIC_OR_DEPARTMENT_OTHER): Payer: Medicaid Other | Admitting: Hematology

## 2018-03-25 ENCOUNTER — Telehealth: Payer: Self-pay

## 2018-03-25 VITALS — BP 131/89 | HR 65 | Temp 98.3°F | Resp 18 | Ht 70.0 in | Wt 225.0 lb

## 2018-03-25 DIAGNOSIS — D508 Other iron deficiency anemias: Secondary | ICD-10-CM | POA: Diagnosis not present

## 2018-03-25 DIAGNOSIS — N920 Excessive and frequent menstruation with regular cycle: Secondary | ICD-10-CM | POA: Insufficient documentation

## 2018-03-25 DIAGNOSIS — Z86718 Personal history of other venous thrombosis and embolism: Secondary | ICD-10-CM | POA: Diagnosis not present

## 2018-03-25 DIAGNOSIS — D500 Iron deficiency anemia secondary to blood loss (chronic): Secondary | ICD-10-CM

## 2018-03-25 LAB — CBC WITH DIFFERENTIAL/PLATELET
ABS IMMATURE GRANULOCYTES: 0.01 10*3/uL (ref 0.00–0.07)
Basophils Absolute: 0 10*3/uL (ref 0.0–0.1)
Basophils Relative: 0 %
Eosinophils Absolute: 0.1 10*3/uL (ref 0.0–0.5)
Eosinophils Relative: 3 %
HCT: 42.6 % (ref 36.0–46.0)
HEMOGLOBIN: 13.8 g/dL (ref 12.0–15.0)
Immature Granulocytes: 0 %
LYMPHS ABS: 2.8 10*3/uL (ref 0.7–4.0)
LYMPHS PCT: 60 %
MCH: 27.2 pg (ref 26.0–34.0)
MCHC: 32.4 g/dL (ref 30.0–36.0)
MCV: 83.9 fL (ref 80.0–100.0)
Monocytes Absolute: 0.3 10*3/uL (ref 0.1–1.0)
Monocytes Relative: 6 %
NEUTROS ABS: 1.4 10*3/uL — AB (ref 1.7–7.7)
NRBC: 0 % (ref 0.0–0.2)
Neutrophils Relative %: 31 %
Platelets: 187 10*3/uL (ref 150–400)
RBC: 5.08 MIL/uL (ref 3.87–5.11)
RDW: 15.5 % (ref 11.5–15.5)
WBC: 4.6 10*3/uL (ref 4.0–10.5)

## 2018-03-25 LAB — IRON AND TIBC
Iron: 30 ug/dL — ABNORMAL LOW (ref 41–142)
SATURATION RATIOS: 8 % — AB (ref 21–57)
TIBC: 354 ug/dL (ref 236–444)
UIBC: 324 ug/dL

## 2018-03-25 LAB — FERRITIN: Ferritin: 26 ng/mL (ref 11–307)

## 2018-03-25 NOTE — Telephone Encounter (Signed)
RTC with Dr Candise Che as needed. Per 10/18 los

## 2018-03-28 ENCOUNTER — Telehealth: Payer: Self-pay

## 2018-03-28 NOTE — Telephone Encounter (Signed)
Per result note from Dr. Candise Che, pt ferritin level of 26 does not indicate need for IV iron at this time. Pt may take PO iron OTC to maintain iron level. Pt verbalized understanding.

## 2018-04-17 ENCOUNTER — Encounter (HOSPITAL_BASED_OUTPATIENT_CLINIC_OR_DEPARTMENT_OTHER): Payer: Self-pay | Admitting: Emergency Medicine

## 2018-04-17 ENCOUNTER — Emergency Department (HOSPITAL_BASED_OUTPATIENT_CLINIC_OR_DEPARTMENT_OTHER)
Admission: EM | Admit: 2018-04-17 | Discharge: 2018-04-17 | Disposition: A | Discharge status: Home / Self Care | Payer: Medicaid Other | Attending: Emergency Medicine | Admitting: Emergency Medicine

## 2018-04-17 ENCOUNTER — Other Ambulatory Visit: Payer: Self-pay

## 2018-04-17 DIAGNOSIS — Z79899 Other long term (current) drug therapy: Secondary | ICD-10-CM | POA: Diagnosis not present

## 2018-04-17 DIAGNOSIS — J0110 Acute frontal sinusitis, unspecified: Secondary | ICD-10-CM

## 2018-04-17 DIAGNOSIS — R070 Pain in throat: Secondary | ICD-10-CM | POA: Insufficient documentation

## 2018-04-17 DIAGNOSIS — R0981 Nasal congestion: Secondary | ICD-10-CM | POA: Diagnosis present

## 2018-04-17 DIAGNOSIS — Z87891 Personal history of nicotine dependence: Secondary | ICD-10-CM | POA: Insufficient documentation

## 2018-04-17 DIAGNOSIS — R05 Cough: Secondary | ICD-10-CM | POA: Diagnosis not present

## 2018-04-17 LAB — GROUP A STREP BY PCR: GROUP A STREP BY PCR: NOT DETECTED

## 2018-04-17 MED ORDER — AMOXICILLIN-POT CLAVULANATE 875-125 MG PO TABS: 1.0000 | ORAL_TABLET | Freq: Two times a day (BID) | ORAL | 0 refills | Status: AC

## 2018-04-17 MED ORDER — PREDNISONE 10 MG (21) PO TBPK: ORAL_TABLET | ORAL | 0 refills | Status: DC

## 2018-04-17 NOTE — ED Provider Notes (Signed)
[facility] EMERGENCY DEPARTMENT Provider Note   CSN: [id number] Arrival date & time: 04/17/18  1517     History   Chief Complaint Chief Complaint  Patient presents with  . Headache  . Sore Throat  . Cough    HPI Erin Boyd is a 32 y.o. female.  HPI   Erin Boyd is a 32 y.o. female, presenting to the ED with sinus congestion for the past 2 weeks.  Now accompanied by sinus pain, mostly in the frontal region.  She has a large amount of green drainage.  This is accompanied by sore throat and intermittent cough.  She has tried a variety of OTC medications, including Flonase and Sudafed without complete relief.  Denies fever/chills, N/V/D, shortness of breath, abdominal pain, or any other complaints.    Past Medical History:  Diagnosis Date  . Cervical abnormality affecting pregnancy, antepartum 08/25/2015  . DVT (deep vein thrombosis) in pregnancy    dx clot in right arm in Jan 2017.  Been off Lovenox since Friday 08/23/15   . Headache    otc med prn  . Heartburn in pregnancy   . No pertinent past medical history   . Short cervix with cervical cerclage in second trimester, antepartum 09/06/2015    Patient Active Problem List   Diagnosis Date Noted  . Syncope   . Sepsis (HCC) 01/19/2018  . Iron deficiency anemia due to chronic blood loss 12/24/2017  . Breech birth 11/06/2015  . Preterm premature rupture of membranes (PPROM) with unknown onset of labor 10/09/2015  . Short cervix with cervical cerclage in second trimester, antepartum 09/06/2015  . Cervical abnormality affecting pregnancy, antepartum 08/25/2015  . DVT of upper extremity (deep vein thrombosis) (HCC) 07/06/2015  . UTI (urinary tract infection) 12/12/2013  . Drug overdose 12/11/2013  . Metabolic acidosis 12/11/2013  . Wolff-Parkinson-White (WPW) pattern 12/11/2013    Past Surgical History:  Procedure Laterality Date  . abdominoplasty, augmentation mammaplasty,  liposuction back thighs arms  01/01/2018   [place]  . AUGMENTATION MAMMAPLASTY    . CERVICAL CERCLAGE N/A 09/06/2015   Procedure: CERCLAGE CERVICAL;  Surgeon: Sherian Rein, MD;  Location: [facility] ORS;  Service: Gynecology;  Laterality: N/A;  . CESAREAN SECTION N/A 11/06/2015   Procedure: CESAREAN SECTION;  Surgeon: Huel Cote, MD;  Location: [facility] BIRTHING SUITES;  Service: Obstetrics;  Laterality: N/A;  . EXCISION MASS ABDOMINAL N/A 01/19/2018   Procedure: incision and drainage of the abdomen;  Surgeon: Glenna Fellows, MD;  Location: [facility] OR;  Service: Plastics;  Laterality: N/A;  . INCISION AND DRAINAGE OF WOUND N/A 01/19/2018   Procedure: icision and drainage of back;  Surgeon: Glenna Fellows, MD;  Location: [facility] OR;  Service: Plastics;  Laterality: N/A;  . INDUCED ABORTION    . pediatric dental surgery    . WISDOM TOOTH EXTRACTION       OB History    Gravida  6   Para  2   Term  1   Preterm  1   AB  3   Living  1     SAB  1   TAB  2   Ectopic  0   Multiple  0   Live Births  1            Home Medications    Prior to Admission medications   Medication Sig Start Date End Date Taking? Authorizing Provider  phentermine 30 MG capsule Take 30 mg by mouth every morning.  Yes [provider]  amoxicillin-clavulanate (AUGMENTIN) 875-125 MG tablet Take 1 tablet by mouth 2 (two) times daily for 7 days. 04/17/18 04/24/18  Caelin Rayl C, PA-C  predniSONE (STERAPRED UNI-PAK 21 TAB) 10 MG (21) TBPK tablet Take 6 tabs (60mg ) day 1, 5 tabs (50mg ) day 2, 4 tabs (40mg ) day 3, 3 tabs (30mg ) day 4, 2 tabs (20mg ) day 5, and 1 tab (10mg ) day 6. 04/17/18   Nimra Puccinelli, Hillard Danker, PA-C    Family History Family History  Problem Relation Age of Onset  . Clotting disorder Neg Hx     Social History Social History   Tobacco Use  . Smoking status: Former Smoker    Packs/day: 0.50    Years: 7.00    Pack years: 3.50    Types: Cigarettes    Last attempt to  quit: 06/08/2014    Years since quitting: 3.8  . Smokeless tobacco: Never Used  Substance Use Topics  . Alcohol use: Yes    Comment: occasional wine - none with pregnancy  . Drug use: No     Allergies   Other and Latex   Review of Systems Review of Systems  Constitutional: Negative for chills, diaphoresis and fever.  HENT: Positive for congestion, rhinorrhea, sinus pressure, sinus pain and sore throat. Negative for ear discharge, ear pain, facial swelling, trouble swallowing and voice change.   Respiratory: Negative for cough and shortness of breath.   Cardiovascular: Negative for chest pain.  Gastrointestinal: Negative for abdominal pain, diarrhea, nausea and vomiting.  Neurological: Positive for headaches.  All other systems reviewed and are negative.    Physical Exam Updated Vital Signs BP (!) 136/94 (BP Location: Left Wrist)   Pulse 82   Temp 98.2 F (36.8 C) (Oral)   Resp 18   Ht 5' 9.5" (1.765 m)   Wt 97.1 kg   LMP 04/15/2018   SpO2 100%   BMI 31.15 kg/m   Physical Exam  Constitutional: She appears well-developed and well-nourished. No distress.  HENT:  Head: Normocephalic and atraumatic.  Nose: Right sinus exhibits frontal sinus tenderness. Left sinus exhibits frontal sinus tenderness.  Mouth/Throat: Oropharynx is clear and moist.  Eyes: Conjunctivae are normal.  Neck: Neck supple.  Cardiovascular: Normal rate, regular rhythm and intact distal pulses.  Pulmonary/Chest: Effort normal and breath sounds normal. No respiratory distress.  Abdominal: There is no guarding.  Musculoskeletal: She exhibits no edema.  Lymphadenopathy:    She has no cervical adenopathy.  Neurological: She is alert.  Skin: Skin is warm and dry. She is not diaphoretic.  Psychiatric: She has a normal mood and affect. Her behavior is normal.  Nursing note and vitals reviewed.    ED Treatments / Results  Labs (all labs ordered are listed, but only abnormal results are  displayed) Labs Reviewed  GROUP A STREP BY PCR    EKG None  Radiology No results found.  Procedures Procedures (including critical care time)  Medications Ordered in ED Medications - No data to display   Initial Impression / Assessment and Plan / ED Course  I have reviewed the triage vital signs and the nursing notes.  Pertinent labs & imaging results that were available during my care of the patient were reviewed by me and considered in my medical decision making (see chart for details).     Patient presents with sinus congestion and sore throat.  Due to the duration of her symptoms, we will initiate a course of antibiotics to treat sinusitis that  may be bacterial.  The patient was given instructions for home care as well as return precautions. Patient voices understanding of these instructions, accepts the plan, and is comfortable with discharge.   Final Clinical Impressions(s) / ED Diagnoses   Final diagnoses:  Acute non-recurrent frontal sinusitis    ED Discharge Orders         Ordered    amoxicillin-clavulanate (AUGMENTIN) 875-125 MG tablet  2 times daily     04/17/18 1623    predniSONE (STERAPRED UNI-PAK 21 TAB) 10 MG (21) TBPK tablet     04/17/18 1623           Anselm Pancoast, PA-C 04/17/18 1626    Gwyneth Sprout, MD 04/19/18 1637

## 2018-04-17 NOTE — Discharge Instructions (Addendum)
°  Hand washing: Wash your hands throughout the day, but especially before and after touching the face, using the restroom, sneezing, coughing, or touching surfaces that have been coughed or sneezed upon. Hydration: Symptoms will be intensified and complicated by dehydration. Dehydration can also extend the duration of symptoms. Drink plenty of fluids and get plenty of rest. You should be drinking at least half a liter of water an hour to stay hydrated. Electrolyte drinks (ex. Gatorade, Powerade, Pedialyte) are also encouraged. You should be drinking enough fluids to make your urine light yellow, almost clear. If this is not the case, you are not drinking enough water. Please note that some of the treatments indicated below will not be effective if you are not adequately hydrated. Pain or fever: Ibuprofen, Naproxen, or acetaminophen (generic for Tylenol) for pain or fever.  Antiinflammatory medications: Take 600 mg of ibuprofen every 6 hours or 440 mg (over the counter dose) to 500 mg (prescription dose) of naproxen every 12 hours for the next 3 days. After this time, these medications may be used as needed for pain. Take these medications with food to avoid upset stomach. Choose only one of these medications, do not take them together. Acetaminophen (generic for Tylenol): Should you continue to have additional pain while taking the ibuprofen or naproxen, you may add in acetaminophen as needed. Your daily total maximum amount of acetaminophen from all sources should be limited to 4000mg /day for persons without liver problems, or 2000mg /day for those with liver problems. Prednisone: Take the prednisone, as directed, in its entirety. Zyrtec or Claritin: May add these medication daily to control underlying symptoms of congestion, sneezing, and other signs of allergies.  These medications are available over-the-counter. Generics: Cetirizine (generic for Zyrtec) and loratadine (generic for Claritin). Fluticasone:  Use fluticasone (generic for Flonase), as directed, for nasal and sinus congestion.  This medication is available over-the-counter. Congestion: Plain guaifenesin (generic for plain Mucinex) may help relieve congestion. Saline sinus rinses and saline nasal sprays may also help relieve congestion.  Sore throat: Warm liquids or Chloraseptic spray may help soothe a sore throat. Gargle twice a day with a salt water solution made from a half teaspoon of salt in a cup of warm water.  Follow up: Follow up with a primary care provider within the next two weeks should symptoms fail to resolve. Return: Return to the ED for significantly worsening symptoms, shortness of breath, persistent vomiting, large amounts of blood in stool, or any other major concerns.  For prescription assistance, may try using prescription discount sites or apps, such as goodrx.com

## 2018-04-17 NOTE — ED Triage Notes (Signed)
Pt c/o sore throat, headache and cough x 1 1/2 weeks.

## 2018-05-09 ENCOUNTER — Encounter (HOSPITAL_BASED_OUTPATIENT_CLINIC_OR_DEPARTMENT_OTHER): Payer: Self-pay | Admitting: Emergency Medicine

## 2018-05-09 ENCOUNTER — Other Ambulatory Visit: Payer: Self-pay

## 2018-05-09 ENCOUNTER — Emergency Department (HOSPITAL_BASED_OUTPATIENT_CLINIC_OR_DEPARTMENT_OTHER)
Admission: EM | Admit: 2018-05-09 | Discharge: 2018-05-09 | Disposition: A | Discharge status: Home / Self Care | Payer: Medicaid Other | Attending: Emergency Medicine | Admitting: Emergency Medicine

## 2018-05-09 DIAGNOSIS — Z91040 Latex allergy status: Secondary | ICD-10-CM | POA: Insufficient documentation

## 2018-05-09 DIAGNOSIS — Z79899 Other long term (current) drug therapy: Secondary | ICD-10-CM | POA: Insufficient documentation

## 2018-05-09 DIAGNOSIS — N3001 Acute cystitis with hematuria: Secondary | ICD-10-CM | POA: Diagnosis not present

## 2018-05-09 DIAGNOSIS — R300 Dysuria: Secondary | ICD-10-CM | POA: Diagnosis present

## 2018-05-09 DIAGNOSIS — Z87891 Personal history of nicotine dependence: Secondary | ICD-10-CM | POA: Diagnosis not present

## 2018-05-09 LAB — PREGNANCY, URINE: Preg Test, Ur: NEGATIVE

## 2018-05-09 LAB — URINALYSIS, ROUTINE W REFLEX MICROSCOPIC

## 2018-05-09 LAB — URINALYSIS, MICROSCOPIC (REFLEX)
RBC / HPF: >50 RBC/hpf (ref 0–5)
WBC, UA: >50 WBC/hpf (ref 0–5)

## 2018-05-09 MED ORDER — CEPHALEXIN 500 MG PO CAPS: 500.0000 mg | ORAL_CAPSULE | Freq: Three times a day (TID) | ORAL | 0 refills | Status: DC

## 2018-05-09 MED ORDER — PHENAZOPYRIDINE HCL 100 MG PO TABS: 100.0000 mg | ORAL_TABLET | Freq: Three times a day (TID) | ORAL | 0 refills | Status: DC | PRN

## 2018-05-09 MED ORDER — PHENAZOPYRIDINE HCL 100 MG PO TABS
95.0000 mg | ORAL_TABLET | Freq: Once | ORAL | Status: AC
  Administered 2018-05-09: 100 mg via ORAL
  Filled 2018-05-09: qty 1

## 2018-05-09 MED ORDER — CEPHALEXIN 250 MG PO CAPS
500.0000 mg | ORAL_CAPSULE | Freq: Once | ORAL | Status: AC
  Administered 2018-05-09: 500 mg via ORAL
  Filled 2018-05-09: qty 2

## 2018-05-09 NOTE — ED Triage Notes (Signed)
Pt states she woke up around 0230 and had painful urination. She states that she has had UTIs in the past and this feels similar. States she has abdominal pain. Feels nauseous.

## 2018-05-09 NOTE — ED Notes (Signed)
Pt attempting to give urine sample

## 2018-05-09 NOTE — ED Provider Notes (Signed)
[facility] EMERGENCY DEPARTMENT Provider Note   CSN: [id number] Arrival date & time: 05/09/18  0604     History   Chief Complaint Chief Complaint  Patient presents with  . Hematuria  . Dysuria    HPI Erin Boyd is a 32 y.o. female.  HPI  This is a 32 year old female who presents with dysuria and urgency.  Patient reports onset of septums this morning when she woke up.  She also endorses hematuria.  Initially she states that her symptoms started out "light."  However, now she describes frank bloody urine and urgency as well as frequency.  Denies any back pain or fevers.  Does report some suprapubic discomfort.  Denies any vaginal discharge or concerns for STDs.  Reports that she is due to have her menstrual cycle in 1 week.  Does not think she could be pregnant.  Past Medical History:  Diagnosis Date  . Cervical abnormality affecting pregnancy, antepartum 08/25/2015  . DVT (deep vein thrombosis) in pregnancy    dx clot in right arm in Jan 2017.  Been off Lovenox since Friday 08/23/15   . Headache    otc med prn  . Heartburn in pregnancy   . No pertinent past medical history   . Short cervix with cervical cerclage in second trimester, antepartum 09/06/2015    Patient Active Problem List   Diagnosis Date Noted  . Syncope   . Sepsis (HCC) 01/19/2018  . Iron deficiency anemia due to chronic blood loss 12/24/2017  . Breech birth 11/06/2015  . Preterm premature rupture of membranes (PPROM) with unknown onset of labor 10/09/2015  . Short cervix with cervical cerclage in second trimester, antepartum 09/06/2015  . Cervical abnormality affecting pregnancy, antepartum 08/25/2015  . DVT of upper extremity (deep vein thrombosis) (HCC) 07/06/2015  . UTI (urinary tract infection) 12/12/2013  . Drug overdose 12/11/2013  . Metabolic acidosis 12/11/2013  . Wolff-Parkinson-White (WPW) pattern 12/11/2013    Past Surgical History:  Procedure Laterality Date  .  abdominoplasty, augmentation mammaplasty, liposuction back thighs arms  01/01/2018   [place]  . AUGMENTATION MAMMAPLASTY    . CERVICAL CERCLAGE N/A 09/06/2015   Procedure: CERCLAGE CERVICAL;  Surgeon: Sherian Rein, MD;  Location: [facility] ORS;  Service: Gynecology;  Laterality: N/A;  . CESAREAN SECTION N/A 11/06/2015   Procedure: CESAREAN SECTION;  Surgeon: Huel Cote, MD;  Location: [facility] BIRTHING SUITES;  Service: Obstetrics;  Laterality: N/A;  . EXCISION MASS ABDOMINAL N/A 01/19/2018   Procedure: incision and drainage of the abdomen;  Surgeon: Glenna Fellows, MD;  Location: [facility] OR;  Service: Plastics;  Laterality: N/A;  . INCISION AND DRAINAGE OF WOUND N/A 01/19/2018   Procedure: icision and drainage of back;  Surgeon: Glenna Fellows, MD;  Location: [facility] OR;  Service: Plastics;  Laterality: N/A;  . INDUCED ABORTION    . pediatric dental surgery    . WISDOM TOOTH EXTRACTION       OB History    Gravida  6   Para  2   Term  1   Preterm  1   AB  3   Living  1     SAB  1   TAB  2   Ectopic  0   Multiple  0   Live Births  1            Home Medications    Prior to Admission medications   Medication Sig Start Date End Date Taking? Authorizing Provider  cephALEXin [facility])  500 MG capsule Take 1 capsule (500 mg total) by mouth 3 (three) times daily. 05/09/18   Bryndan Bilyk, Mayer Maskerourtney F, MD  phenazopyridine (PYRIDIUM) 100 MG tablet Take 1 tablet (100 mg total) by mouth 3 (three) times daily as needed for pain. 05/09/18   Ronetta Molla, Mayer Maskerourtney F, MD  phentermine 30 MG capsule Take 30 mg by mouth every morning.    [provider]  predniSONE (STERAPRED UNI-PAK 21 TAB) 10 MG (21) TBPK tablet Take 6 tabs (60mg ) day 1, 5 tabs (50mg ) day 2, 4 tabs (40mg ) day 3, 3 tabs (30mg ) day 4, 2 tabs (20mg ) day 5, and 1 tab (10mg ) day 6. 04/17/18   Joy, Hillard DankerShawn C, PA-C    Family History Family History  Problem Relation Age of Onset  . Clotting disorder Neg Hx      Social History Social History   Tobacco Use  . Smoking status: Former Smoker    Packs/day: 0.50    Years: 7.00    Pack years: 3.50    Types: Cigarettes    Last attempt to quit: 06/08/2014    Years since quitting: 3.9  . Smokeless tobacco: Never Used  Substance Use Topics  . Alcohol use: Yes    Comment: occasional wine - none with pregnancy  . Drug use: No     Allergies   Other and Latex   Review of Systems Review of Systems  Constitutional: Negative for fever.  Gastrointestinal: Positive for nausea. Negative for abdominal pain and vomiting.  Genitourinary: Positive for dysuria, frequency, hematuria and urgency. Negative for flank pain.  All other systems reviewed and are negative.    Physical Exam Updated Vital Signs BP 134/78   Pulse 68   Temp 98.2 F (36.8 C) (Oral)   Resp 18   Ht 1.765 m (5' 9.5")   Wt 97.1 kg   LMP 04/15/2018   SpO2 99%   BMI 31.15 kg/m   Physical Exam  Constitutional: She is oriented to person, place, and time. She appears well-developed and well-nourished. No distress.  HENT:  Head: Normocephalic and atraumatic.  Neck: Neck supple.  Cardiovascular: Normal rate, regular rhythm and normal heart sounds.  Pulmonary/Chest: Effort normal and breath sounds normal. No respiratory distress. She has no wheezes.  Abdominal: Soft. There is tenderness.  Suprapubic tenderness to palpation, no rebound or guarding  Neurological: She is alert and oriented to person, place, and time.  Skin: Skin is warm and dry.  Psychiatric: She has a normal mood and affect.  Nursing note and vitals reviewed.    ED Treatments / Results  Labs (all labs ordered are listed, but only abnormal results are displayed) Labs Reviewed  URINALYSIS, ROUTINE W REFLEX MICROSCOPIC - Abnormal; Notable for the following components:      Result Value   Color, Urine BROWN (*)    APPearance TURBID (*)    Glucose, UA   (*)    Value: TEST NOT REPORTED DUE TO COLOR  INTERFERENCE OF URINE PIGMENT   Hgb urine dipstick   (*)    Value: TEST NOT REPORTED DUE TO COLOR INTERFERENCE OF URINE PIGMENT   Bilirubin Urine   (*)    Value: TEST NOT REPORTED DUE TO COLOR INTERFERENCE OF URINE PIGMENT   Ketones, ur   (*)    Value: TEST NOT REPORTED DUE TO COLOR INTERFERENCE OF URINE PIGMENT   Protein, ur   (*)    Value: TEST NOT REPORTED DUE TO COLOR INTERFERENCE OF URINE PIGMENT   Nitrite   (*)  Value: TEST NOT REPORTED DUE TO COLOR INTERFERENCE OF URINE PIGMENT   Leukocytes, UA   (*)    Value: TEST NOT REPORTED DUE TO COLOR INTERFERENCE OF URINE PIGMENT   All other components within normal limits  URINALYSIS, MICROSCOPIC (REFLEX) - Abnormal; Notable for the following components:   Bacteria, UA MANY (*)    All other components within normal limits  URINE CULTURE  PREGNANCY, URINE    EKG None  Radiology No results found.  Procedures Procedures (including critical care time)  Medications Ordered in ED Medications  phenazopyridine (PYRIDIUM) tablet 100 mg (100 mg Oral Given 05/09/18 0641)  cephALEXin (KEFLEX) capsule 500 mg (500 mg Oral Given 05/09/18 0725)     Initial Impression / Assessment and Plan / ED Course  I have reviewed the triage vital signs and the nursing notes.  Pertinent labs & imaging results that were available during my care of the patient were reviewed by me and considered in my medical decision making (see chart for details).     Patient presents with hematuria, dysuria.  She is overall nontoxic-appearing and vital signs are reassuring.  She is afebrile.  Urinalysis is difficult to fully assess because of hematuria however there are greater than 50 white cells, greater than 50 red cells and many bacteria.  This was cultured.  Patient treated for acute cystitis with hematuria.  Will start Keflex.  Patient given Pyridium for bladder spasm.  She was given strict return precautions.  After history, exam, and medical workup I feel the  patient has been appropriately medically screened and is safe for discharge home. Pertinent diagnoses were discussed with the patient. Patient was given return precautions.   Final Clinical Impressions(s) / ED Diagnoses   Final diagnoses:  Acute cystitis with hematuria    ED Discharge Orders         Ordered    cephALEXin (KEFLEX) 500 MG capsule  3 times daily     05/09/18 0716    phenazopyridine (PYRIDIUM) 100 MG tablet  3 times daily PRN     05/09/18 0716           Shon Baton, MD 05/09/18 2251

## 2018-05-10 LAB — URINE CULTURE: Culture: <10000 — AB

## 2018-06-04 ENCOUNTER — Emergency Department (HOSPITAL_BASED_OUTPATIENT_CLINIC_OR_DEPARTMENT_OTHER)
Admission: EM | Admit: 2018-06-04 | Discharge: 2018-06-04 | Disposition: A | Discharge status: Home / Self Care | Payer: Medicaid Other | Attending: Emergency Medicine | Admitting: Emergency Medicine

## 2018-06-04 ENCOUNTER — Other Ambulatory Visit: Payer: Self-pay

## 2018-06-04 ENCOUNTER — Encounter (HOSPITAL_BASED_OUTPATIENT_CLINIC_OR_DEPARTMENT_OTHER): Payer: Self-pay | Admitting: *Deleted

## 2018-06-04 DIAGNOSIS — Z87891 Personal history of nicotine dependence: Secondary | ICD-10-CM | POA: Diagnosis not present

## 2018-06-04 DIAGNOSIS — R300 Dysuria: Secondary | ICD-10-CM

## 2018-06-04 DIAGNOSIS — Z91040 Latex allergy status: Secondary | ICD-10-CM | POA: Insufficient documentation

## 2018-06-04 DIAGNOSIS — Z79899 Other long term (current) drug therapy: Secondary | ICD-10-CM | POA: Diagnosis not present

## 2018-06-04 DIAGNOSIS — I456 Pre-excitation syndrome: Secondary | ICD-10-CM | POA: Diagnosis not present

## 2018-06-04 DIAGNOSIS — N898 Other specified noninflammatory disorders of vagina: Secondary | ICD-10-CM

## 2018-06-04 LAB — URINALYSIS, MICROSCOPIC (REFLEX): WBC, UA: >50 WBC/hpf (ref 0–5)

## 2018-06-04 LAB — URINALYSIS, ROUTINE W REFLEX MICROSCOPIC

## 2018-06-04 MED ORDER — NITROFURANTOIN MONOHYD MACRO 100 MG PO CAPS: 100.0000 mg | ORAL_CAPSULE | Freq: Two times a day (BID) | ORAL | 0 refills | Status: AC

## 2018-06-04 MED ORDER — METRONIDAZOLE 500 MG PO TABS: 500.0000 mg | ORAL_TABLET | Freq: Two times a day (BID) | ORAL | 0 refills | Status: DC

## 2018-06-04 MED ORDER — FLUCONAZOLE 150 MG PO TABS: 150.0000 mg | ORAL_TABLET | Freq: Every day | ORAL | 0 refills | Status: AC

## 2018-06-04 NOTE — ED Notes (Signed)
Pt states she was treated for UTI 3 weeks ago. She states she is now having dysuria and hematuria that started today. Pt states she was prescribed antibiotic and finished medications. Note-pt had lipo approx 5 months ago in upper arms, so no blood pressure in upper arms.

## 2018-06-04 NOTE — ED Triage Notes (Signed)
Dysuria.  Recently treated for UTI.

## 2018-06-04 NOTE — Discharge Instructions (Addendum)
Take antibiotics and Flagyl as prescribed.  Take the entire course, even if your symptoms improve. Use Diflucan as needed for signs of yeast infection after finishing the antibiotics. Follow-up with your OB/GYN for further evaluation of your symptoms. Make sure you are staying well-hydrated water.  This is very important. Use Tylenol and ibuprofen as needed for pain.  You may also use heating pads to help with discomfort. Return to the emergency room with any new, worsening, or concerning symptoms.

## 2018-06-04 NOTE — ED Notes (Signed)
Pt understood dc material. NAD noted. Scripts given at [facility] and one sent electronically.

## 2018-06-05 NOTE — ED Provider Notes (Signed)
[facility] EMERGENCY DEPARTMENT Provider Note   CSN: [id number] Arrival date & time: 06/04/18  1643     History   Chief Complaint Chief Complaint  Patient presents with  . Urinary Tract Infection    HPI Erin Boyd is a 32 y.o. female presenting for evaluation of dysuria and hematuria.  Patient states she has been having issues with BV, yeast infections, and UTIs in the past several months.  She was seen most recently 2 weeks ago by her OB/GYN, during which she had a normal Pap smear.  She was found to have BV at that time, treated with MetroGel.  After being treated, she then developed a yeast infection and used Monistat.  Today, she started to have dysuria and hematuria, consistent with her previous UTIs.  She took Pyridium with mild improvement of her symptoms.  She denies fevers, chills, nausea, vomiting, or anterior abdominal pain.  She also reports over the past 3 days, she has had return of her vaginal discharge.  She states that it is the same as when she has BV.  She is concerned about using the creams, as this is causing yeast infections and treatment for yeast infections worsens her BV. Pt states she has ne medical problems, takes no medications daily.   HPI  Past Medical History:  Diagnosis Date  . Cervical abnormality affecting pregnancy, antepartum 08/25/2015  . DVT (deep vein thrombosis) in pregnancy    dx clot in right arm in Jan 2017.  Been off Lovenox since Friday 08/23/15   . Headache    otc med prn  . Heartburn in pregnancy   . No pertinent past medical history   . Short cervix with cervical cerclage in second trimester, antepartum 09/06/2015    Patient Active Problem List   Diagnosis Date Noted  . Syncope   . Sepsis (HCC) 01/19/2018  . Iron deficiency anemia due to chronic blood loss 12/24/2017  . Breech birth 11/06/2015  . Preterm premature rupture of membranes (PPROM) with unknown onset of labor 10/09/2015  . Short cervix with  cervical cerclage in second trimester, antepartum 09/06/2015  . Cervical abnormality affecting pregnancy, antepartum 08/25/2015  . DVT of upper extremity (deep vein thrombosis) (HCC) 07/06/2015  . UTI (urinary tract infection) 12/12/2013  . Drug overdose 12/11/2013  . Metabolic acidosis 12/11/2013  . Wolff-Parkinson-White (WPW) pattern 12/11/2013    Past Surgical History:  Procedure Laterality Date  . abdominoplasty, augmentation mammaplasty, liposuction back thighs arms  01/01/2018   [place]  . AUGMENTATION MAMMAPLASTY    . CERVICAL CERCLAGE N/A 09/06/2015   Procedure: CERCLAGE CERVICAL;  Surgeon: Sherian ReinJody [name], MD;  Location: [facility] ORS;  Service: Gynecology;  Laterality: N/A;  . CESAREAN SECTION N/A 11/06/2015   Procedure: CESAREAN SECTION;  Surgeon: Huel CoteKathy Richardson, MD;  [facility];  Service: Obstetrics;  Laterality: N/A;  . EXCISION MASS ABDOMINAL N/A 01/19/2018   Procedure: incision and drainage of the abdomen;  Surgeon: Glenna Fellowshimmappa, Brinda, MD;  Location: [facility] OR;  Service: Plastics;  Laterality: N/A;  . INCISION AND DRAINAGE OF WOUND N/A 01/19/2018   Procedure: icision and drainage of back;  Surgeon: Glenna Fellowshimmappa, Brinda, MD;  Location: [facility] OR;  Service: Plastics;  Laterality: N/A;  . INDUCED ABORTION    . pediatric dental surgery    . WISDOM TOOTH EXTRACTION       OB History    Gravida  6   Para  2   Term  1   Preterm  1   AB  3   Living  1     SAB  1   TAB  2   Ectopic  0   Multiple  0   Live Births  1            Home Medications    Prior to Admission medications   Medication Sig Start Date End Date Taking? Authorizing Provider  cephALEXin (KEFLEX) 500 MG capsule Take 1 capsule (500 mg total) by mouth 3 (three) times daily. 05/09/18   Horton, Mayer Masker, MD  fluconazole (DIFLUCAN) 150 MG tablet Take 1 tablet (150 mg total) by mouth daily for 1 day. After finishing antibiotics if symptoms are not improving 06/04/18  06/05/18  Yazmina Pareja, PA-C  metroNIDAZOLE (FLAGYL) 500 MG tablet Take 1 tablet (500 mg total) by mouth 2 (two) times daily. 06/04/18   Cayleigh Paull, PA-C  nitrofurantoin, macrocrystal-monohydrate, (MACROBID) 100 MG capsule Take 1 capsule (100 mg total) by mouth 2 (two) times daily for 5 days. 06/04/18 06/09/18  Aymee Fomby, PA-C  phenazopyridine (PYRIDIUM) 100 MG tablet Take 1 tablet (100 mg total) by mouth 3 (three) times daily as needed for pain. 05/09/18   Horton, Mayer Masker, MD  phentermine 30 MG capsule Take 30 mg by mouth every morning.    [provider]  predniSONE (STERAPRED UNI-PAK 21 TAB) 10 MG (21) TBPK tablet Take 6 tabs (60mg ) day 1, 5 tabs (50mg ) day 2, 4 tabs (40mg ) day 3, 3 tabs (30mg ) day 4, 2 tabs (20mg ) day 5, and 1 tab (10mg ) day 6. 04/17/18   Joy, Hillard Danker, PA-C    Family History Family History  Problem Relation Age of Onset  . Clotting disorder Neg Hx     Social History Social History   Tobacco Use  . Smoking status: Former Smoker    Packs/day: 0.50    Years: 7.00    Pack years: 3.50    Types: Cigarettes    Last attempt to quit: 06/08/2014    Years since quitting: 3.9  . Smokeless tobacco: Never Used  Substance Use Topics  . Alcohol use: Yes    Comment: occasional wine - none with pregnancy  . Drug use: No     Allergies   Other and Latex   Review of Systems Review of Systems  Genitourinary: Positive for dysuria, hematuria and vaginal discharge.  All other systems reviewed and are negative.    Physical Exam Updated Vital Signs BP 127/77 (BP Location: Right Arm)   Pulse 72   Temp 98.1 F (36.7 C) (Oral)   Resp 18   Ht 5' 9.5" (1.765 m)   Wt 97.1 kg   LMP 05/09/2018   SpO2 99%   BMI 31.15 kg/m   Physical Exam Vitals signs and nursing note reviewed.  Constitutional:      General: She is not in acute distress.    Appearance: She is well-developed.     Comments: Sitting comfortably in the bed in no acute distress    HENT:     Head: Normocephalic and atraumatic.  Eyes:     Conjunctiva/sclera: Conjunctivae normal.     Pupils: Pupils are equal, round, and reactive to light.  Neck:     Musculoskeletal: Normal range of motion and neck supple.  Cardiovascular:     Rate and Rhythm: Normal rate and regular rhythm.  Pulmonary:     Effort: Pulmonary effort is normal. No respiratory distress.     Breath sounds: Normal breath sounds.  No wheezing.  Abdominal:     General: There is no distension.     Palpations: Abdomen is soft. There is no mass.     Tenderness: There is no abdominal tenderness. There is no right CVA tenderness, left CVA tenderness, guarding or rebound.     Hernia: No hernia is present.     Comments: No tenderness palpation of the abdomen.  Soft without rigidity, guarding, distention.  Negative rebound.  No CVA tenderness.  Genitourinary:    Comments: Patient declined pelvic exam Musculoskeletal: Normal range of motion.  Skin:    General: Skin is warm and dry.  Neurological:     Mental Status: She is alert and oriented to person, place, and time.      ED Treatments / Results  Labs (all labs ordered are listed, but only abnormal results are displayed) Labs Reviewed  URINALYSIS, ROUTINE W REFLEX MICROSCOPIC - Abnormal; Notable for the following components:      Result Value   Color, Urine ORANGE (*)    APPearance TURBID (*)    Glucose, UA   (*)    Value: TEST NOT REPORTED DUE TO COLOR INTERFERENCE OF URINE PIGMENT   Hgb urine dipstick   (*)    Value: TEST NOT REPORTED DUE TO COLOR INTERFERENCE OF URINE PIGMENT   Bilirubin Urine   (*)    Value: TEST NOT REPORTED DUE TO COLOR INTERFERENCE OF URINE PIGMENT   Ketones, ur   (*)    Value: TEST NOT REPORTED DUE TO COLOR INTERFERENCE OF URINE PIGMENT   Protein, ur   (*)    Value: TEST NOT REPORTED DUE TO COLOR INTERFERENCE OF URINE PIGMENT   Nitrite   (*)    Value: TEST NOT REPORTED DUE TO COLOR INTERFERENCE OF URINE PIGMENT    Leukocytes, UA   (*)    Value: TEST NOT REPORTED DUE TO COLOR INTERFERENCE OF URINE PIGMENT   All other components within normal limits  URINALYSIS, MICROSCOPIC (REFLEX) - Abnormal; Notable for the following components:   Bacteria, UA MANY (*)    All other components within normal limits  URINE CULTURE    EKG None  Radiology No results found.  Procedures Procedures (including critical care time)  Medications Ordered in ED Medications - No data to display   Initial Impression / Assessment and Plan / ED Course  I have reviewed the triage vital signs and the nursing notes.  Pertinent labs & imaging results that were available during my care of the patient were reviewed by me and considered in my medical decision making (see chart for details).     Pt presenting for evaluation of dysuria, hematuria, and vaginal discharge.  Physical exam reassuring, patient is afebrile not tachycardic.  Appears nontoxic.  Abdominal exam reassuring, no tenderness palpation.  Low suspicion for pyelo or PID.  Considering patient's symptoms, and many bacteria noted on UA, will treat for UTI.  Urine sent for culture.  Patient states discharge is consistent with previous episodes of BV.  As such, will treat with Flagyl.  Diflucan given as needed for yeast infections following antibiotic treatment.  Patient encouraged to follow-up with OB/GYN regarding frequent infections. At this time, pt appears safe for d/c. Return precautions given. Pt states she understands and agrees to plan.    Final Clinical Impressions(s) / ED Diagnoses   Final diagnoses:  Dysuria  Vaginal irritation  Vaginal discharge    ED Discharge Orders         Ordered  nitrofurantoin, macrocrystal-monohydrate, (MACROBID) 100 MG capsule  2 times daily     06/04/18 2112    metroNIDAZOLE (FLAGYL) 500 MG tablet  2 times daily     06/04/18 2112    fluconazole (DIFLUCAN) 150 MG tablet  Daily     06/04/18 2112           Alveria ApleyCaccavale,  Antonetta Clanton, PA-C 06/05/18 0159    Tilden Fossaees, Elizabeth, MD 06/05/18 1126

## 2018-06-06 LAB — URINE CULTURE

## 2018-06-07 ENCOUNTER — Telehealth: Payer: Self-pay | Admitting: *Deleted

## 2018-06-07 NOTE — Telephone Encounter (Signed)
Post ED Visit - Positive Culture Follow-up  Culture report reviewed by antimicrobial stewardship pharmacist:  []  Erin Boyd, Pharm.D. []  Erin Boyd, Pharm.D., BCPS AQ-ID []  Erin Boyd, Pharm.D., BCPS []  Erin Boyd, Pharm.D., BCPS []  Erin Boyd, [address]Pharm[address]D., BCPS, AAHIVP []  Erin Boyd, Pharm.D., BCPS, AAHIVP [x]  Erin Boyd, PharmD, BCPS []  Erin Boyd, PharmD, BCPS []  Erin Boyd, PharmD, BCPS []  Erin Boyd, PharmD  Positive urine culture Treated with Nitrofurantoin Monohyd Macro, organism sensitive to the same and no further patient follow-up is required at this time.  Erin Boyd, Erin [facility] 06/07/2018, 10:40 AM

## 2018-11-12 ENCOUNTER — Telehealth (HOSPITAL_COMMUNITY): Payer: Self-pay | Admitting: Emergency Medicine

## 2018-11-12 ENCOUNTER — Ambulatory Visit (INDEPENDENT_AMBULATORY_CARE_PROVIDER_SITE_OTHER)
Admission: RE | Admit: 2018-11-12 | Discharge: 2018-11-12 | Disposition: A | Discharge status: Home / Self Care | Payer: Medicaid Other | Source: Ambulatory Visit

## 2018-11-12 ENCOUNTER — Inpatient Hospital Stay: Admission: RE | Admit: 2018-11-12 | Payer: Medicaid Other | Source: Ambulatory Visit

## 2018-11-12 DIAGNOSIS — R350 Frequency of micturition: Secondary | ICD-10-CM | POA: Diagnosis not present

## 2018-11-12 DIAGNOSIS — N3000 Acute cystitis without hematuria: Secondary | ICD-10-CM | POA: Diagnosis not present

## 2018-11-12 DIAGNOSIS — R300 Dysuria: Secondary | ICD-10-CM

## 2018-11-12 DIAGNOSIS — Z87440 Personal history of urinary (tract) infections: Secondary | ICD-10-CM | POA: Diagnosis not present

## 2018-11-12 MED ORDER — CEPHALEXIN 500 MG PO CAPS: 500.0000 mg | ORAL_CAPSULE | Freq: Two times a day (BID) | ORAL | 0 refills | Status: DC

## 2018-11-12 NOTE — Telephone Encounter (Signed)
Rx error from today's virtual visit. [facility] sent.

## 2018-11-12 NOTE — ED Provider Notes (Signed)
Virtual Visit via Video Note:  LYNLEY KILLILEA  initiated request for Telemedicine visit with [facility] team. I connected with Erin Boyd  on 11/12/2018 at 11:41 AM  for a synchronized telemedicine visit using a video enabled HIPPA compliant telemedicine application. I verified that I am speaking with Erin Boyd  using two identifiers. [place] Hall-Potvin, PA-C  was physically located in a [facility] site and Erin Boyd was located at a different location.   The limitations of evaluation and management by telemedicine as well as the availability of in-person appointments were discussed. Patient was informed that she  may incur a bill ( including co-pay) for this virtual visit encounter. Erin Boyd  expressed understanding and gave verbal consent to proceed with virtual visit.     History of Present Illness:Erin Boyd  is a 33 y.o. female presents with 1 week of burning with urination and urinary frequency.  Patient states she has history of UTI, feels similar to this.  Patient has tried AZO with some relief of burning.  Patient denies fever, flank/abdominal pain, pelvic pain, vaginal discharge, hematuria.  She states she has taken Keflex in the past with successful resolution of symptoms.  She denies history of pyelonephritis, renal calculi.  Past Medical History:  Diagnosis Date  . Cervical abnormality affecting pregnancy, antepartum 08/25/2015  . DVT (deep vein thrombosis) in pregnancy    dx clot in right arm in Jan 2017.  Been off Lovenox since Friday 08/23/15   . Headache    otc med prn  . Heartburn in pregnancy   . No pertinent past medical history   . Short cervix with cervical cerclage in second trimester, antepartum 09/06/2015    Allergies  Allergen Reactions  . Other     PORK-  . Latex Rash        Observations/Objective:   Assessment and Plan: 33 year old female with remote  history of UTI that was adequately treated with Keflex, presenting with similar symptoms.  Will prescribe Keflex today.  Discussed return precautions.  If symptoms do not improve or worsen patient needs to have in person evaluation.  Patient verbalized understanding, okay with plan.  Follow Up Instructions:    I discussed the assessment and treatment plan with the patient. The patient was provided an opportunity to ask questions and all were answered. The patient agreed with the plan and demonstrated an understanding of the instructions.   The patient was advised to call back or seek an in-person evaluation if the symptoms worsen or if the condition fails to improve as anticipated.  I provided 20 minutes of non-face-to-face time during this encounter.    Porter, PA-C  11/12/2018 11:41 AM        Hall-Potvin, [place], PA-C 11/12/18 1157

## 2018-11-12 NOTE — Discharge Instructions (Addendum)
Discussed need to have in-person appt if symptoms do not resolve/worsen after taking antibiotics

## 2018-11-13 ENCOUNTER — Telehealth (HOSPITAL_COMMUNITY): Payer: Self-pay | Admitting: Emergency Medicine

## 2018-11-13 MED ORDER — CEPHALEXIN 500 MG PO CAPS: 500.0000 mg | ORAL_CAPSULE | Freq: Two times a day (BID) | ORAL | 0 refills | Status: AC

## 2018-11-13 NOTE — Telephone Encounter (Signed)
Resent prescription electronically.  Called pharmacy and confirmed that they received order.

## 2019-03-21 ENCOUNTER — Emergency Department (HOSPITAL_BASED_OUTPATIENT_CLINIC_OR_DEPARTMENT_OTHER): Payer: Self-pay

## 2019-03-21 ENCOUNTER — Encounter (HOSPITAL_BASED_OUTPATIENT_CLINIC_OR_DEPARTMENT_OTHER): Payer: Self-pay | Admitting: Emergency Medicine

## 2019-03-21 ENCOUNTER — Other Ambulatory Visit: Payer: Self-pay

## 2019-03-21 ENCOUNTER — Emergency Department (HOSPITAL_BASED_OUTPATIENT_CLINIC_OR_DEPARTMENT_OTHER)
Admission: EM | Admit: 2019-03-21 | Discharge: 2019-03-21 | Disposition: A | Discharge status: Home / Self Care | Payer: Self-pay | Attending: Emergency Medicine | Admitting: Emergency Medicine

## 2019-03-21 DIAGNOSIS — Y929 Unspecified place or not applicable: Secondary | ICD-10-CM | POA: Insufficient documentation

## 2019-03-21 DIAGNOSIS — S060X1A Concussion with loss of consciousness of 30 minutes or less, initial encounter: Secondary | ICD-10-CM | POA: Insufficient documentation

## 2019-03-21 DIAGNOSIS — Y999 Unspecified external cause status: Secondary | ICD-10-CM | POA: Insufficient documentation

## 2019-03-21 DIAGNOSIS — S3991XA Unspecified injury of abdomen, initial encounter: Secondary | ICD-10-CM | POA: Insufficient documentation

## 2019-03-21 DIAGNOSIS — S060X9A Concussion with loss of consciousness of unspecified duration, initial encounter: Secondary | ICD-10-CM

## 2019-03-21 DIAGNOSIS — Z91040 Latex allergy status: Secondary | ICD-10-CM | POA: Insufficient documentation

## 2019-03-21 DIAGNOSIS — Z87891 Personal history of nicotine dependence: Secondary | ICD-10-CM | POA: Insufficient documentation

## 2019-03-21 DIAGNOSIS — W108XXA Fall (on) (from) other stairs and steps, initial encounter: Secondary | ICD-10-CM | POA: Insufficient documentation

## 2019-03-21 DIAGNOSIS — M25522 Pain in left elbow: Secondary | ICD-10-CM | POA: Insufficient documentation

## 2019-03-21 DIAGNOSIS — S161XXA Strain of muscle, fascia and tendon at neck level, initial encounter: Secondary | ICD-10-CM | POA: Insufficient documentation

## 2019-03-21 DIAGNOSIS — Z79899 Other long term (current) drug therapy: Secondary | ICD-10-CM | POA: Insufficient documentation

## 2019-03-21 DIAGNOSIS — S298XXA Other specified injuries of thorax, initial encounter: Secondary | ICD-10-CM | POA: Insufficient documentation

## 2019-03-21 DIAGNOSIS — Y939 Activity, unspecified: Secondary | ICD-10-CM | POA: Insufficient documentation

## 2019-03-21 LAB — BASIC METABOLIC PANEL
Anion gap: 12 (ref 5–15)
BUN: 9 mg/dL (ref 6–20)
CO2: 20 mmol/L — ABNORMAL LOW (ref 22–32)
Calcium: 8.9 mg/dL (ref 8.9–10.3)
Chloride: 106 mmol/L (ref 98–111)
Creatinine, Ser: 0.59 mg/dL (ref 0.44–1.00)
GFR calc Af Amer: >60 mL/min (ref >60)
GFR calc non Af Amer: >60 mL/min (ref >60)
Glucose, Bld: 102 mg/dL — ABNORMAL HIGH (ref 70–99)
Potassium: 3.6 mmol/L (ref 3.5–5.1)
Sodium: 138 mmol/L (ref 135–145)

## 2019-03-21 LAB — CBC WITH DIFFERENTIAL/PLATELET
Abs Immature Granulocytes: 0.02 10*3/uL (ref 0.00–0.07)
Basophils Absolute: 0 10*3/uL (ref 0.0–0.1)
Basophils Relative: 0 %
Eosinophils Absolute: 0.1 10*3/uL (ref 0.0–0.5)
Eosinophils Relative: 1 %
HCT: 38.8 % (ref 36.0–46.0)
Hemoglobin: 12.3 g/dL (ref 12.0–15.0)
Immature Granulocytes: 0 %
Lymphocytes Relative: 33 %
Lymphs Abs: 2.3 10*3/uL (ref 0.7–4.0)
MCH: 28.5 pg (ref 26.0–34.0)
MCHC: 31.7 g/dL (ref 30.0–36.0)
MCV: 89.8 fL (ref 80.0–100.0)
Monocytes Absolute: 0.6 10*3/uL (ref 0.1–1.0)
Monocytes Relative: 9 %
Neutro Abs: 3.8 10*3/uL (ref 1.7–7.7)
Neutrophils Relative %: 57 %
Platelets: 212 10*3/uL (ref 150–400)
RBC: 4.32 MIL/uL (ref 3.87–5.11)
RDW: 14.6 % (ref 11.5–15.5)
WBC: 6.8 10*3/uL (ref 4.0–10.5)
nRBC: 0 % (ref 0.0–0.2)

## 2019-03-21 LAB — HCG, QUANTITATIVE, PREGNANCY: hCG, Beta Chain, Quant, S: <1 m[IU]/mL (ref <5)

## 2019-03-21 LAB — ETHANOL: Alcohol, Ethyl (B): 32 mg/dL — ABNORMAL HIGH (ref <10)

## 2019-03-21 MED ORDER — FENTANYL CITRATE (PF) 100 MCG/2ML IJ SOLN
100.0000 ug | Freq: Once | INTRAMUSCULAR | Status: AC
  Administered 2019-03-21: 100 ug via INTRAVENOUS
  Filled 2019-03-21: qty 2

## 2019-03-21 MED ORDER — IOHEXOL 300 MG/ML  SOLN
100.0000 mL | Freq: Once | INTRAMUSCULAR | Status: AC | PRN
  Administered 2019-03-21: 07:00:00 100 mL via INTRAVENOUS

## 2019-03-21 NOTE — ED Provider Notes (Signed)
[facility] EMERGENCY DEPARTMENT Provider Note   CSN: [id number] Arrival date & time: 03/21/19  0417     History   Chief Complaint Chief Complaint  Patient presents with  . Fall    HPI Erin Boyd is a 33 y.o. female.     The history is provided by the patient.  Fall This is a new problem. The current episode started 1 to 2 hours ago. The problem occurs constantly. The problem has been gradually worsening. Associated symptoms include chest pain, abdominal pain and headaches. Exacerbated by: Movement and palpation. Nothing relieves the symptoms.  Patient presents after fall.  She admits to alcohol use tonight.  She reports she fell down stairs landing on her back, she hit her head and injured her left elbow.  She also reports abdominal pain. She reports loss of consciousness of unknown duration She reports headache at this time.  Past Medical History:  Diagnosis Date  . Cervical abnormality affecting pregnancy, antepartum 08/25/2015  . DVT (deep vein thrombosis) in pregnancy    dx clot in right arm in Jan 2017.  Been off Lovenox since Friday 08/23/15   . Headache    otc med prn  . Heartburn in pregnancy   . No pertinent past medical history   . Short cervix with cervical cerclage in second trimester, antepartum 09/06/2015    Patient Active Problem List   Diagnosis Date Noted  . Syncope   . Sepsis ([place]) 01/19/2018  . Iron deficiency anemia due to chronic blood loss 12/24/2017  . Breech birth 11/06/2015  . Preterm premature rupture of membranes (PPROM) with unknown onset of labor 10/09/2015  . Short cervix with cervical cerclage in second trimester, antepartum 09/06/2015  . Cervical abnormality affecting pregnancy, antepartum 08/25/2015  . DVT of upper extremity (deep vein thrombosis) ([place]) 07/06/2015  . UTI (urinary tract infection) 12/12/2013  . Drug overdose 12/11/2013  . Metabolic acidosis 35/00/9381  . Wolff-Parkinson-White (WPW) pattern  12/11/2013    Past Surgical History:  Procedure Laterality Date  . abdominoplasty, augmentation mammaplasty, liposuction back thighs arms  01/01/2018   [place]  . AUGMENTATION MAMMAPLASTY    . CERVICAL CERCLAGE N/A 09/06/2015   Procedure: CERCLAGE CERVICAL;  Surgeon: Janyth Contes, MD;  Location: [facility];  Service: Gynecology;  Laterality: N/A;  . CESAREAN SECTION N/A 11/06/2015   Procedure: CESAREAN SECTION;  Surgeon: Paula Compton, MD;  Location: [facility];  Service: Obstetrics;  Laterality: N/A;  . EXCISION MASS ABDOMINAL N/A 01/19/2018   Procedure: incision and drainage of the abdomen;  Surgeon: Irene Limbo, MD;  Location: [facility];  Service: Plastics;  Laterality: N/A;  . INCISION AND DRAINAGE OF WOUND N/A 01/19/2018   Procedure: icision and drainage of back;  Surgeon: Irene Limbo, MD;  Location: [facility];  Service: Plastics;  Laterality: N/A;  . INDUCED ABORTION    . pediatric dental surgery    . WISDOM TOOTH EXTRACTION       OB History    Gravida  6   Para  2   Term  1   Preterm  1   AB  3   Living  1     SAB  1   TAB  2   Ectopic  0   Multiple  0   Live Births  1            Home Medications    Prior to Admission medications   Medication Sig Start Date End Date Taking?  Authorizing Provider  phentermine 30 MG capsule Take 30 mg by mouth every morning.  03/21/19  [provider]    Family History Family History  Problem Relation Age of Onset  . Clotting disorder Neg Hx     Social History Social History   Tobacco Use  . Smoking status: Former Smoker    Packs/day: 0.50    Years: 7.00    Pack years: 3.50    Types: Cigarettes    Quit date: 06/08/2014    Years since quitting: 4.7  . Smokeless tobacco: Never Used  Substance Use Topics  . Alcohol use: Yes    Comment: occasional wine - none with pregnancy  . Drug use: No     Allergies   Other and Latex   Review of Systems Review of Systems   Constitutional: Negative for fever.  Cardiovascular: Positive for chest pain.  Gastrointestinal: Positive for abdominal pain.  Musculoskeletal: Positive for arthralgias and neck pain.  Neurological: Positive for headaches.  All other systems reviewed and are negative.    Physical Exam Updated Vital Signs BP 120/70 (BP Location: Right Arm)   Pulse 88   Temp 97.9 F (36.6 C) (Oral)   Resp 18   Ht 1.753 m (5\' 9" )   Wt 91.6 kg   LMP 03/16/2019 (Exact Date)   SpO2 100%   BMI 29.83 kg/m   Physical Exam CONSTITUTIONAL: Well developed, asleep when I entered the room.  Appears intoxicated HEAD: Normocephalic/atraumatic EYES: EOMI/PERRL ENMT: Mucous membranes moist, midface stable, no dental injury, mandible stable nontender, no nasal injury noted, no facial crepitus NECK: No bruising to anterior neck SPINE/BACK: Diffuse cervical spine tenderness, no thoracic or lumbar tenderness, no bruising/crepitance/stepoffs noted to spine CV: S1/S2 noted, no murmurs/rubs/gallops noted LUNGS: Lungs are clear to auscultation bilaterally, no apparent distress Chest-diffuse left-sided chest wall tenderness, no crepitus ABDOMEN: soft, diffuse left upper and left lower quadrant tenderness without bruising GU:no cva tenderness NEURO: Pt is somnolent but arousable.  She follows commands.  She moves all extremities x4.  GCS 14 EXTREMITIES: Patient is holding her left elbow.  Pulses normal/equal, tenderness noted to left elbow but no deformities.  She is able to flex and extend the elbow but limited due to pain. There is no left wrist or shoulder tenderness.  Pelvis is stable.  All other extremities/joints palpated/ranged and nontender SKIN: warm, color normal PSYCH: no abnormalities of mood noted, alert and oriented to situation   ED Treatments / Results  Labs (all labs ordered are listed, but only abnormal results are displayed) Labs Reviewed  CBC WITH DIFFERENTIAL/PLATELET  BASIC METABOLIC PANEL   ETHANOL  HCG, QUANTITATIVE, PREGNANCY    EKG None  Radiology No results found.  Procedures Procedures    Medications Ordered in ED Medications  fentaNYL (SUBLIMAZE) injection 100 mcg (has no administration in time range)  iohexol (OMNIPAQUE) 300 MG/ML solution 100 mL (100 mLs Intravenous Contrast Given 03/21/19 0649)     Initial Impression / Assessment and Plan / ED Course  I have reviewed the triage vital signs and the nursing notes.  Pertinent labs & imaging results that were available during my care of the patient were reviewed by me and considered in my medical decision making (see chart for details).        6:23 AM Patient presents after a fall, likely due to EtOH intoxication She is hemodynamically appropriate this time.  However she does report hitting her head and had a loss of consciousness.  She has  cervical spine tenderness.  She has diffuse chest and abdominal tenderness. Patient will require trauma imaging   6:58 AM Signed out to dr plunkett at shift change to f/u on CT imaging/labs  Final Clinical Impressions(s) / ED Diagnoses   Final diagnoses:  Concussion with loss of consciousness, initial encounter  Strain of neck muscle, initial encounter  Blunt trauma to chest, initial encounter  Blunt trauma to abdomen, initial encounter  Left elbow pain    ED Discharge Orders    None       Zadie Rhine, MD 03/21/19 [phone]

## 2019-03-21 NOTE — ED Notes (Signed)
Pt verbalized understanding of dc instructions.

## 2019-03-21 NOTE — ED Provider Notes (Signed)
Imaging is negative for any acute process.  On reevaluation patient is awake alert and normal mental status.  She will use Tylenol and ibuprofen for pain and she was discharged home.   Blanchie Dessert, MD 03/21/19 [phone]

## 2019-03-21 NOTE — Discharge Instructions (Signed)
Take tylenol and ibuprofen as needed for pain and discomfort.  Heating pad may also help.

## 2019-03-21 NOTE — ED Triage Notes (Signed)
Patient arrived via POV c/o fall approximately 30 minutes prior to arrival. Patient states she "fell down her sisters stairs landing on her left buttock, elbow and striking her head". Patient endorses ETOH. Patient is AO x 4, VS WDL, normal gait.

## 2019-03-31 ENCOUNTER — Encounter (HOSPITAL_BASED_OUTPATIENT_CLINIC_OR_DEPARTMENT_OTHER): Payer: Self-pay

## 2019-03-31 ENCOUNTER — Other Ambulatory Visit: Payer: Self-pay

## 2019-03-31 ENCOUNTER — Emergency Department (HOSPITAL_BASED_OUTPATIENT_CLINIC_OR_DEPARTMENT_OTHER)
Admission: EM | Admit: 2019-03-31 | Discharge: 2019-03-31 | Disposition: A | Discharge status: Home / Self Care | Payer: Self-pay | Attending: Emergency Medicine | Admitting: Emergency Medicine

## 2019-03-31 DIAGNOSIS — Z86718 Personal history of other venous thrombosis and embolism: Secondary | ICD-10-CM | POA: Insufficient documentation

## 2019-03-31 DIAGNOSIS — N760 Acute vaginitis: Secondary | ICD-10-CM | POA: Insufficient documentation

## 2019-03-31 DIAGNOSIS — N898 Other specified noninflammatory disorders of vagina: Secondary | ICD-10-CM

## 2019-03-31 DIAGNOSIS — B9689 Other specified bacterial agents as the cause of diseases classified elsewhere: Secondary | ICD-10-CM

## 2019-03-31 DIAGNOSIS — Z91040 Latex allergy status: Secondary | ICD-10-CM | POA: Insufficient documentation

## 2019-03-31 DIAGNOSIS — Z87891 Personal history of nicotine dependence: Secondary | ICD-10-CM | POA: Insufficient documentation

## 2019-03-31 LAB — HIV ANTIBODY (ROUTINE TESTING W REFLEX): HIV Screen 4th Generation wRfx: NONREACTIVE

## 2019-03-31 LAB — WET PREP, GENITAL
Sperm: NONE SEEN
Trich, Wet Prep: NONE SEEN
Yeast Wet Prep HPF POC: NONE SEEN

## 2019-03-31 LAB — URINALYSIS, ROUTINE W REFLEX MICROSCOPIC
Bilirubin Urine: NEGATIVE
Glucose, UA: NEGATIVE mg/dL
Hgb urine dipstick: NEGATIVE
Ketones, ur: NEGATIVE mg/dL
Leukocytes,Ua: NEGATIVE
Nitrite: NEGATIVE
Protein, ur: NEGATIVE mg/dL
Specific Gravity, Urine: >1.03 — ABNORMAL HIGH (ref 1.005–1.030)
pH: 5.5 (ref 5.0–8.0)

## 2019-03-31 LAB — PREGNANCY, URINE: Preg Test, Ur: NEGATIVE

## 2019-03-31 MED ORDER — METRONIDAZOLE 500 MG PO TABS: 500.0000 mg | ORAL_TABLET | Freq: Two times a day (BID) | ORAL | 0 refills | Status: AC

## 2019-03-31 NOTE — ED Provider Notes (Signed)
[facility] EMERGENCY DEPARTMENT Provider Note   CSN: [id number] Arrival date & time: 03/31/19  1151     History   Chief Complaint Chief Complaint  Patient presents with  . Vaginal Discharge  . Nail Problem    HPI Erin Boyd is a 33 y.o. female with history of trichomoniasis tested positive on 02/23/2019 who presents today for evaluation of vaginal discharge.  She reports that she noted foul-smelling increased amounts of vaginal discharge 2 days ago.  She denies dysuria, increased frequency or urgency. She states that she suspects that her female partner may be not faithful. She reports that she was treated for trichomoniasis and that when he was tested he reported everything was negative.  She has since had unprotected sexual contact with him again.  She denies any pelvic pain.    She also reports greenish discoloration of the right great toenail for 2 days.  She reports that it was painful so she removed the acrylic nail and noted the area was green.  She denies any surrounding redness or pain in the right great toe.  No fevers.     HPI  Past Medical History:  Diagnosis Date  . Cervical abnormality affecting pregnancy, antepartum 08/25/2015  . DVT (deep vein thrombosis) in pregnancy    dx clot in right arm in Jan 2017.  Been off Lovenox since Friday 08/23/15   . Headache    otc med prn  . Heartburn in pregnancy   . No pertinent past medical history   . Short cervix with cervical cerclage in second trimester, antepartum 09/06/2015    Patient Active Problem List   Diagnosis Date Noted  . Syncope   . Sepsis ([place]) 01/19/2018  . Iron deficiency anemia due to chronic blood loss 12/24/2017  . Breech birth 11/06/2015  . Preterm premature rupture of membranes (PPROM) with unknown onset of labor 10/09/2015  . Short cervix with cervical cerclage in second trimester, antepartum 09/06/2015  . Cervical abnormality affecting pregnancy, antepartum 08/25/2015  .  DVT of upper extremity (deep vein thrombosis) ([place]) 07/06/2015  . UTI (urinary tract infection) 12/12/2013  . Drug overdose 12/11/2013  . Metabolic acidosis 06/16/3233  . Wolff-Parkinson-White (WPW) pattern 12/11/2013    Past Surgical History:  Procedure Laterality Date  . abdominoplasty, augmentation mammaplasty, liposuction back thighs arms  01/01/2018   [place]  . AUGMENTATION MAMMAPLASTY    . CERVICAL CERCLAGE N/A 09/06/2015   Procedure: CERCLAGE CERVICAL;  Surgeon: Janyth Contes, MD;  Location: [facility];  Service: Gynecology;  Laterality: N/A;  . CESAREAN SECTION N/A 11/06/2015   Procedure: CESAREAN SECTION;  Surgeon: Paula Compton, MD;  Location: [facility];  Service: Obstetrics;  Laterality: N/A;  . EXCISION MASS ABDOMINAL N/A 01/19/2018   Procedure: incision and drainage of the abdomen;  Surgeon: Irene Limbo, MD;  Location: [facility];  Service: Plastics;  Laterality: N/A;  . INCISION AND DRAINAGE OF WOUND N/A 01/19/2018   Procedure: icision and drainage of back;  Surgeon: Irene Limbo, MD;  Location: [facility];  Service: Plastics;  Laterality: N/A;  . INDUCED ABORTION    . pediatric dental surgery    . WISDOM TOOTH EXTRACTION       OB History    Gravida  6   Para  2   Term  1   Preterm  1   AB  3   Living  1     SAB  1   TAB  2   Ectopic  0   Multiple  0   Live Births  1            Home Medications    Prior to Admission medications   Medication Sig Start Date End Date Taking? Authorizing Provider  metroNIDAZOLE (FLAGYL) 500 MG tablet Take 1 tablet (500 mg total) by mouth 2 (two) times daily. 03/31/19   Cristina GongHammond, Elizabeth W, PA-C  phentermine 30 MG capsule Take 30 mg by mouth every morning.  03/21/19  [provider]    Family History Family History  Problem Relation Age of Onset  . Clotting disorder Neg Hx     Social History Social History   Tobacco Use  . Smoking status: Former Smoker     Packs/day: 0.50    Years: 7.00    Pack years: 3.50    Types: Cigarettes    Quit date: 06/08/2014    Years since quitting: 4.8  . Smokeless tobacco: Never Used  Substance Use Topics  . Alcohol use: Yes    Comment: occ  . Drug use: No     Allergies   Other and Latex   Review of Systems Review of Systems  Constitutional: Negative for chills, fatigue and fever.  HENT: Negative for congestion.   Respiratory: Negative for cough, chest tightness and shortness of breath.   Cardiovascular: Negative for chest pain.  Gastrointestinal: Negative for abdominal pain, diarrhea, nausea and vomiting.  Genitourinary: Positive for vaginal discharge. Negative for dysuria, frequency, urgency, vaginal bleeding and vaginal pain.  Musculoskeletal: Negative for back pain and neck pain.  Neurological: Negative for weakness.  Psychiatric/Behavioral: Negative for confusion.  All other systems reviewed and are negative.    Physical Exam Updated Vital Signs BP 134/74 (BP Location: Left Arm)   Pulse 82   Temp 98.5 F (36.9 C) (Oral)   Resp 16   Ht 5\' 9"  (1.753 m)   Wt 91.6 kg   LMP 03/16/2019 (Exact Date)   SpO2 100%   BMI 29.83 kg/m   Physical Exam Vitals signs and nursing note reviewed. Exam conducted with a chaperone present (female ED rn).  Constitutional:      General: She is not in acute distress.    Appearance: She is well-developed. She is not diaphoretic.  HENT:     Head: Normocephalic and atraumatic.  Eyes:     General: No scleral icterus.       Right eye: No discharge.        Left eye: No discharge.     Conjunctiva/sclera: Conjunctivae normal.  Neck:     Musculoskeletal: Normal range of motion.  Cardiovascular:     Rate and Rhythm: Normal rate and regular rhythm.  Pulmonary:     Effort: Pulmonary effort is normal. No respiratory distress.     Breath sounds: No stridor.  Abdominal:     General: There is no distension.  Genitourinary:    Comments: Normal external female  genitalia.  There is a mild amount of clear discharge in the vaginal canal.  No tenderness or fullness to bilateral adnexa.  No cervical motion tenderness.  Cervical os is closed. Musculoskeletal:        General: No deformity.  Skin:    General: Skin is warm and dry.     Comments: The right great toe nail appears green in the middle.  It is questionably thickened however there is damage from acrylic nail removal.  No abnormal redness or swelling around the nail folds.  Neurological:  General: No focal deficit present.     Mental Status: She is alert.     Cranial Nerves: No cranial nerve deficit.     Motor: No abnormal muscle tone.  Psychiatric:        Behavior: Behavior normal.      ED Treatments / Results  Labs (all labs ordered are listed, but only abnormal results are displayed) Labs Reviewed  WET PREP, GENITAL - Abnormal; Notable for the following components:      Result Value   Clue Cells Wet Prep HPF POC PRESENT (*)    WBC, Wet Prep HPF POC MODERATE (*)    All other components within normal limits  URINALYSIS, ROUTINE W REFLEX MICROSCOPIC - Abnormal; Notable for the following components:   Specific Gravity, Urine >1.030 (*)    All other components within normal limits  PREGNANCY, URINE  HIV ANTIBODY (ROUTINE TESTING W REFLEX)  RPR  GC/CHLAMYDIA PROBE AMP ([place]) NOT AT [facility]    EKG None  Radiology No results found.  Procedures Procedures (including critical care time)  Medications Ordered in ED Medications - No data to display   Initial Impression / Assessment and Plan / ED Course  I have reviewed the triage vital signs and the nursing notes.  Pertinent labs & imaging results that were available during my care of the patient were reviewed by me and considered in my medical decision making (see chart for details).        Patient to be discharged with instructions to follow up with OBGYN. Discussed importance of using protection when sexually  active. Pt understands that they have GC/Chlamydia cultures pending and that they will need to inform all sexual partners if results return positive.  Patient offered treatment with azithromycin and Rocephin however she declined at this time. Wet prep with increased clue cells consistent with BV.  No trichomoniasis noted.  Exam is not concerning for PID, patient is hemodynamically stable, afebrile with no cervical motion tenderness on exam.  We will treat patient with Flagyl.  She is advised to not drink alcohol while taking this medicine.  She requested HIV and syphilis testing, both of which were ordered.  She elected for outpatient podiatry follow-up of her toenail.  Return precautions were discussed with patient who states their understanding.  At the time of discharge patient denied any unaddressed complaints or concerns.  Patient is agreeable for discharge home.   Final Clinical Impressions(s) / ED Diagnoses   Final diagnoses:  BV (bacterial vaginosis)  Vaginal discharge    ED Discharge Orders         Ordered    metroNIDAZOLE (FLAGYL) 500 MG tablet  2 times daily     03/31/19 1337           Cristina Gong, [place] 03/31/19 1711    Little, Ambrose Finland, MD 04/01/19 [phone]

## 2019-03-31 NOTE — Discharge Instructions (Addendum)
You have test for gonorrhea and chlamydia pending.  If these tests are positive you will need to notify all sexual partners.  You will only receive notification if your tests are positive.  You also have test pending for HIV and syphilis.  Please do not have any sexual contact for 2 weeks.  Today your diagnosed with bacterial vaginosis and received a prescription for metronidazole also known as Flagyl. It is very important that you do not consume any alcohol while taking this medication as it will cause you to become violently ill.

## 2019-03-31 NOTE — ED Triage Notes (Signed)
Pt c/o vaginal d/c x 2 days-also c/o right great toenail issue-NAD-steady gait

## 2019-04-01 LAB — RPR: RPR Ser Ql: NONREACTIVE

## 2019-04-03 LAB — GC/CHLAMYDIA PROBE AMP (~~LOC~~) NOT AT ARMC
Chlamydia: NEGATIVE
Neisseria Gonorrhea: NEGATIVE

## 2020-12-22 ENCOUNTER — Emergency Department (HOSPITAL_BASED_OUTPATIENT_CLINIC_OR_DEPARTMENT_OTHER): Payer: Self-pay

## 2020-12-22 ENCOUNTER — Other Ambulatory Visit: Payer: Self-pay

## 2020-12-22 ENCOUNTER — Emergency Department (HOSPITAL_BASED_OUTPATIENT_CLINIC_OR_DEPARTMENT_OTHER)
Admission: EM | Admit: 2020-12-22 | Discharge: 2020-12-22 | Disposition: A | Discharge status: Home / Self Care | Payer: Self-pay | Attending: Emergency Medicine | Admitting: Emergency Medicine

## 2020-12-22 ENCOUNTER — Encounter (HOSPITAL_BASED_OUTPATIENT_CLINIC_OR_DEPARTMENT_OTHER): Payer: Self-pay | Admitting: Emergency Medicine

## 2020-12-22 DIAGNOSIS — Z28310 Unvaccinated for covid-19: Secondary | ICD-10-CM | POA: Insufficient documentation

## 2020-12-22 DIAGNOSIS — Z20822 Contact with and (suspected) exposure to covid-19: Secondary | ICD-10-CM | POA: Insufficient documentation

## 2020-12-22 DIAGNOSIS — Z91040 Latex allergy status: Secondary | ICD-10-CM | POA: Insufficient documentation

## 2020-12-22 DIAGNOSIS — J029 Acute pharyngitis, unspecified: Secondary | ICD-10-CM

## 2020-12-22 DIAGNOSIS — Z87891 Personal history of nicotine dependence: Secondary | ICD-10-CM | POA: Insufficient documentation

## 2020-12-22 DIAGNOSIS — J069 Acute upper respiratory infection, unspecified: Secondary | ICD-10-CM | POA: Insufficient documentation

## 2020-12-22 LAB — RESP PANEL BY RT-PCR (FLU A&B, COVID) ARPGX2
Influenza A by PCR: NEGATIVE
Influenza B by PCR: NEGATIVE
SARS Coronavirus 2 by RT PCR: NEGATIVE

## 2020-12-22 MED ORDER — IBUPROFEN 800 MG PO TABS
800.0000 mg | ORAL_TABLET | Freq: Once | ORAL | Status: AC
  Administered 2020-12-22: 800 mg via ORAL
  Filled 2020-12-22: qty 1

## 2020-12-22 MED ORDER — METHOCARBAMOL 500 MG PO TABS: 500.0000 mg | ORAL_TABLET | Freq: Two times a day (BID) | ORAL | 0 refills | Status: AC

## 2020-12-22 MED ORDER — BENZONATATE 100 MG PO CAPS: 100.0000 mg | ORAL_CAPSULE | Freq: Three times a day (TID) | ORAL | 0 refills | Status: DC

## 2020-12-22 MED ORDER — PROMETHAZINE-DM 6.25-15 MG/5ML PO SYRP: 5.0000 mL | ORAL_SOLUTION | Freq: Four times a day (QID) | ORAL | 0 refills | Status: DC | PRN

## 2020-12-22 MED ORDER — ALUM & MAG HYDROXIDE-SIMETH 200-200-20 MG/5ML PO SUSP
30.0000 mL | Freq: Once | ORAL | Status: AC
  Administered 2020-12-22: 30 mL via ORAL
  Filled 2020-12-22: qty 30

## 2020-12-22 MED ORDER — LIDOCAINE VISCOUS HCL 2 % MT SOLN
15.0000 mL | Freq: Once | OROMUCOSAL | Status: AC
  Administered 2020-12-22: 15 mL via ORAL
  Filled 2020-12-22: qty 15

## 2020-12-22 MED ORDER — ONDANSETRON 4 MG PO TBDP: 4.0000 mg | ORAL_TABLET | Freq: Three times a day (TID) | ORAL | 0 refills | Status: AC | PRN

## 2020-12-22 NOTE — ED Triage Notes (Addendum)
Pt arrives pov with c/o sinus congestion, sore throat, and cough with 1 incident of blood streaked sputum x 1 week. Pt endorses fever 1 week pta, afebrile today. Pt endorses ability to swallow secretions. Pt also endorses concern and wish to be tested for STD, reports vaginal d/c and odor x 1 week

## 2020-12-22 NOTE — ED Provider Notes (Signed)
[facility] EMERGENCY DEPARTMENT Provider Note   CSN: [id number] Arrival date & time: 12/22/20  0935     History Chief Complaint  Patient presents with   Sore Throat    Erin Boyd is a 35 y.o. female.  HPI Patient is a 35 year old female with past medical history detailed below significant for DVT, headache, syncope, sepsis, recreational drug use, Wolff-Parkinson-White  Patient is presented to the ER today with complaints of sore throat, cough, congestion, fatigue, myalgias, mild circumferential headaches.  She states her symptoms been ongoing for 10 days.  She states that she seems to have progressed through several of the symptoms which have now resolved although she does have persistent cough and congestion and sore throat.  She denies any lightheadedness or dizziness no chest pain or shortness of breath.  She states that she does have some achy pressure in her chest when she coughs.  She states that she pees a small amount of bright red sputum with a couple coughing spells.  She also showed me a picture where she has 2 or 3 dots of mucus on a handkerchief that  does show some blood.  She states that she has felt achy and fatigued.  She states that she is not vaccinated against COVID.  No recent surgeries, hospitalization, long travel, estrogen containing OCP, cancer history.  No unilateral leg swelling.  No history of VTE apart from during pregnancy.      Past Medical History:  Diagnosis Date   Cervical abnormality affecting pregnancy, antepartum 08/25/2015   DVT (deep vein thrombosis) in pregnancy    dx clot in right arm in Jan 2017.  Been off Lovenox since Friday 08/23/15    Headache    otc med prn   Heartburn in pregnancy    No pertinent past medical history    Short cervix with cervical cerclage in second trimester, antepartum 09/06/2015    Patient Active Problem List   Diagnosis Date Noted   Syncope    Sepsis (HCC) 01/19/2018   Iron deficiency  anemia due to chronic blood loss 12/24/2017   Breech birth 11/06/2015   Preterm premature rupture of membranes (PPROM) with unknown onset of labor 10/09/2015   Short cervix with cervical cerclage in second trimester, antepartum 09/06/2015   Cervical abnormality affecting pregnancy, antepartum 08/25/2015   DVT of upper extremity (deep vein thrombosis) (HCC) 07/06/2015   UTI (urinary tract infection) 12/12/2013   Drug overdose 12/11/2013   Metabolic acidosis 12/11/2013   Wolff-Parkinson-White (WPW) pattern 12/11/2013    Past Surgical History:  Procedure Laterality Date   abdominoplasty, augmentation mammaplasty, liposuction back thighs arms  01/01/2018   [place]   AUGMENTATION MAMMAPLASTY     CERVICAL CERCLAGE N/A 09/06/2015   Procedure: CERCLAGE CERVICAL;  Surgeon: Sherian Rein, MD;  Location: [facility] ORS;  Service: Gynecology;  Laterality: N/A;   CESAREAN SECTION N/A 11/06/2015   Procedure: CESAREAN SECTION;  Surgeon: Huel Cote, MD;  Location: [facility];  Service: Obstetrics;  Laterality: N/A;   EXCISION MASS ABDOMINAL N/A 01/19/2018   Procedure: incision and drainage of the abdomen;  Surgeon: Glenna Fellows, MD;  Location: [facility] OR;  Service: Plastics;  Laterality: N/A;   INCISION AND DRAINAGE OF WOUND N/A 01/19/2018   Procedure: icision and drainage of back;  Surgeon: Glenna Fellows, MD;  Location: [facility] OR;  Service: Plastics;  Laterality: N/A;   INDUCED ABORTION     pediatric dental surgery     WISDOM TOOTH EXTRACTION  OB History     Gravida  6   Para  2   Term  1   Preterm  1   AB  3   Living  1      SAB  1   IAB  2   Ectopic  0   Multiple  0   Live Births  1           Family History  Problem Relation Age of Onset   Clotting disorder Neg Hx     Social History   Tobacco Use   Smoking status: Former    Packs/day: 0.50    Years: 7.00    Pack years: 3.50    Types: Cigarettes    Quit date: 06/08/2014    Years  since quitting: 6.5   Smokeless tobacco: Never  Vaping Use   Vaping Use: Never used  Substance Use Topics   Alcohol use: Yes    Comment: occ   Drug use: No    Home Medications Prior to Admission medications   Medication Sig Start Date End Date Taking? Authorizing Provider  benzonatate (TESSALON) 100 MG capsule Take 1 capsule (100 mg total) by mouth every 8 (eight) hours. 12/22/20  Yes Suriyah Vergara S, PA  methocarbamol (ROBAXIN) 500 MG tablet Take 1 tablet (500 mg total) by mouth 2 (two) times daily. 12/22/20  Yes Allysia Ingles S, PA  ondansetron (ZOFRAN ODT) 4 MG disintegrating tablet Take 1 tablet (4 mg total) by mouth every 8 (eight) hours as needed for nausea or vomiting. 12/22/20  Yes Clark Clowdus S, PA  promethazine-dextromethorphan (PROMETHAZINE-DM) 6.25-15 MG/5ML syrup Take 5 mLs by mouth 4 (four) times daily as needed for cough. 12/22/20  Yes Paulmichael Schreck S, PA  metroNIDAZOLE (FLAGYL) 500 MG tablet Take 1 tablet (500 mg total) by mouth 2 (two) times daily. 03/31/19   Cristina Gong, PA-C  phentermine 30 MG capsule Take 30 mg by mouth every morning.  03/21/19  [provider]    Allergies    Other and Latex  Review of Systems   Review of Systems  Constitutional:  Positive for chills, fatigue and fever.  HENT:  Positive for congestion, postnasal drip, rhinorrhea and sore throat.   Eyes:  Negative for redness.  Respiratory:  Positive for cough. Negative for shortness of breath.   Cardiovascular:  Negative for chest pain and leg swelling.  Gastrointestinal:  Positive for nausea. Negative for abdominal pain, diarrhea and vomiting.  Endocrine: Negative for polyphagia.  Genitourinary:  Negative for dysuria.  Musculoskeletal:  Positive for myalgias.  Skin:  Negative for rash.  Neurological:  Positive for headaches. Negative for syncope.  Psychiatric/Behavioral:  Negative for confusion.    Physical Exam Updated Vital Signs BP 120/74 (BP Location: Left Arm)    Pulse 85   Temp 98.4 F (36.9 C) (Oral)   Resp 16   Ht 5\' 9"  (1.753 m)   Wt 100.2 kg   LMP 12/15/2020   SpO2 100%   BMI 32.64 kg/m   Physical Exam Vitals and nursing note reviewed.  Constitutional:      General: She is not in acute distress.    Appearance: She is obese.     Comments: Pleasant well-appearing 35 year old.  In no acute distress.  Sitting comfortably in bed.  Able answer questions appropriately follow commands. No increased work of breathing. Speaking in full sentences.   HENT:     Head: Normocephalic and atraumatic.     Nose: Nose normal.  Eyes:     General: No scleral icterus. Cardiovascular:     Rate and Rhythm: Normal rate and regular rhythm.     Pulses: Normal pulses.     Heart sounds: Normal heart sounds.  Pulmonary:     Effort: Pulmonary effort is normal. No respiratory distress.     Breath sounds: No wheezing.  Abdominal:     Palpations: Abdomen is soft.     Tenderness: There is no abdominal tenderness.  Musculoskeletal:     Cervical back: Normal range of motion.     Right lower leg: No edema.     Left lower leg: No edema.  Skin:    General: Skin is warm and dry.     Capillary Refill: Capillary refill takes less than 2 seconds.  Neurological:     Mental Status: She is alert. Mental status is at baseline.  Psychiatric:        Mood and Affect: Mood normal.        Behavior: Behavior normal.    ED Results / Procedures / Treatments   Labs (all labs ordered are listed, but only abnormal results are displayed) Labs Reviewed  RESP PANEL BY RT-PCR (FLU A&B, COVID) [id number]    EKG None  Radiology DG Chest Port 1 View  Result Date: 12/22/2020 CLINICAL DATA:  Chest pain. EXAM: PORTABLE CHEST 1 VIEW COMPARISON:  April 07, 2019 FINDINGS: The heart size and mediastinal contours are within normal limits. Both lungs are clear. The visualized skeletal structures are unremarkable. IMPRESSION: No active disease. Electronically Signed   By: Ted Mcalpine M.D.   On: 12/22/2020 11:20    Procedures Procedures   Medications Ordered in ED Medications  ibuprofen (ADVIL) tablet 800 mg (800 mg Oral Given 12/22/20 1118)  alum & mag hydroxide-simeth (MAALOX/MYLANTA) 200-200-20 MG/5ML suspension 30 mL (30 mLs Oral Given 12/22/20 1118)    And  lidocaine (XYLOCAINE) 2 % viscous mouth solution 15 mL (15 mLs Oral Given 12/22/20 1118)    ED Course  I have reviewed the triage vital signs and the nursing notes.  Pertinent labs & imaging results that were available during my care of the patient were reviewed by me and considered in my medical decision making (see chart for details).    MDM Rules/Calculators/A&P                          Patient here with complaints of cough congestion sore throat some fatigue malaise congestion.  Has had small amount of BRB from her cough.  Seems that this is also completely resolved.  She denies any fevers currently over the past few days.  She states she did have a fever several days ago however this is resolved.  She has not taken any Tylenol or ibuprofen in the past 24 hours.  She denies any chest pain or shortness of breath.  He is overall well-appearing his vital signs within normal limits plan exam is reassuring.  SPO2 100% on room air.  Will test for COVID influenza RSV.  Chest x-ray reviewed no infiltrate or acute abnormality.  No active disease or explanation for her symptoms.  Suspect bronchitis.  Will treat with antitussives, give her Zofran to use as needed.  Robaxin for myalgias.  She will drink plenty of water.  Return precautions were given otherwise she will follow-up with her primary care provider.  Also provided her with information for the [facility] Department of [employer] given that  she is interested in STD screening but is asymptomatic.  Final Clinical Impression(s) / ED Diagnoses Final diagnoses:  Viral URI with cough  Viral pharyngitis    Rx / DC Orders ED Discharge Orders           Ordered    ondansetron (ZOFRAN ODT) 4 MG disintegrating tablet  Every 8 hours PRN        12/22/20 1209    promethazine-dextromethorphan (PROMETHAZINE-DM) 6.25-15 MG/5ML syrup  4 times daily PRN        12/22/20 1209    benzonatate (TESSALON) 100 MG capsule  Every 8 hours        12/22/20 1209    methocarbamol (ROBAXIN) 500 MG tablet  2 times daily        12/22/20 1209             Gailen ShelterFondaw, September Mormile S, [place]PA 12/22/20 1211    Rozelle LoganHorton, Kristie M, DO 12/23/20 [id number]

## 2020-12-22 NOTE — ED Notes (Signed)
Patient declines assessment at this time to finish her phone call.  Will use call light when she is ready.

## 2020-12-22 NOTE — Discharge Instructions (Addendum)
You presented to the ER today with symptoms consistent with a viral infection.  As we discussed antibiotics do not help with the symptoms. Please use Flonase twice daily as prescribed 2 sprays in each nostril.  Drink plenty of water.  Continue to use Benadryl 25-50 mg every 6 hours.  Also alternate between Tylenol ibuprofen as discussed below. I have prescribed you a nausea medicine in case you have any nausea.  You may also fill his prescription and use it if needed.  I also prescribed you Robaxin which is a muscle relaxer please take this for body aches they can cause drowsiness however.  Still take Tylenol and ibuprofen.  I also prescribed you to cough medications. Please follow-up with the [facility] of [facility] for STD screening.  Please drink plenty of water.  You may always return to the ER for any new or concerning symptoms.  Please use Tylenol or ibuprofen for pain.  You may use 600 mg ibuprofen every 6 hours or 1000 mg of Tylenol every 6 hours.  You may choose to alternate between the 2.  This would be most effective.  Not to exceed 4 g of Tylenol within 24 hours.  Not to exceed 3200 mg ibuprofen 24 hours.    Your COVID and influenza test were negative

## 2020-12-26 ENCOUNTER — Ambulatory Visit: Payer: Self-pay | Admitting: *Deleted

## 2020-12-26 NOTE — Telephone Encounter (Signed)
Reason for Disposition  [1] Sore throat is the only symptom AND [2] present > 48 hours  Answer Assessment - Initial Assessment Questions 1. ONSET: "When did the throat start hurting?" (Hours or days ago)      12/14/20 2. SEVERITY: "How bad is the sore throat?" (Scale 1-10; mild, moderate or severe)   - MILD (1-3):  doesn't interfere with eating or normal activities   - MODERATE (4-7): interferes with eating some solids and normal activities   - SEVERE (8-10):  excruciating pain, interferes with most normal activities   - SEVERE DYSPHAGIA: can't swallow liquids, drooling     mild 3. STREP EXPOSURE: "Has there been any exposure to strep within the past week?" If Yes, ask: "What type of contact occurred?"      Unsure 4.  VIRAL SYMPTOMS: "Are there any symptoms of a cold, such as a runny nose, cough, hoarse voice or red eyes?"      Runny nose, clear, greenish in AM 5. FEVER: "Do you have a fever?" If Yes, ask: "What is your temperature, how was it measured, and when did it start?"     On 12/14/20.  101.0 6. PUS ON THE TONSILS: "Is there pus on the tonsils in the back of your throat?"     Unsure, "Bumpy" 7. OTHER SYMPTOMS: "Do you have any other symptoms?" (e.g., difficulty breathing, headache, rash)     Mild cough  Protocols used: Sore Throat-A-AH

## 2020-12-26 NOTE — Telephone Encounter (Signed)
Community line call: Pt reports sore throat, "Bumps in throat and tongue." Seen at [facility] 12/22/20, states "No better, didn't test for STDs as I asked." Pt called her PCP but can't get in until August.  Reports sore throat onset 12/14/20. Had fever, not presently. Reports runny nose. Covid, flu tested at [facility], states negative both.States PCP gave no recommendations. Advised UC for F/U, verbalizes understanding.

## 2021-01-09 IMAGING — CT CT CERVICAL SPINE W/O CM
3 of 4 series · 11 of 33 positions shown, 13 images · non-contrast
Comparison: None.

CLINICAL DATA: Status post fall down stairs with a blow to the head
this morning. Pain. Initial encounter.

EXAM:
CT HEAD WITHOUT CONTRAST
CT CERVICAL SPINE WITHOUT CONTRAST
TECHNIQUE: Multidetector CT imaging of the head and cervical spine was
performed following the standard protocol without intravenous
contrast. Multiplanar CT image reconstructions of the cervical spine
were also generated.

[Series 3: c_spine 2.0 i30s 3 · axial · 0.32mm/px · z∈[-280,-158]mm · 3 of 93 slices shown, 4 images]
[im 16/93  soft-tissue]
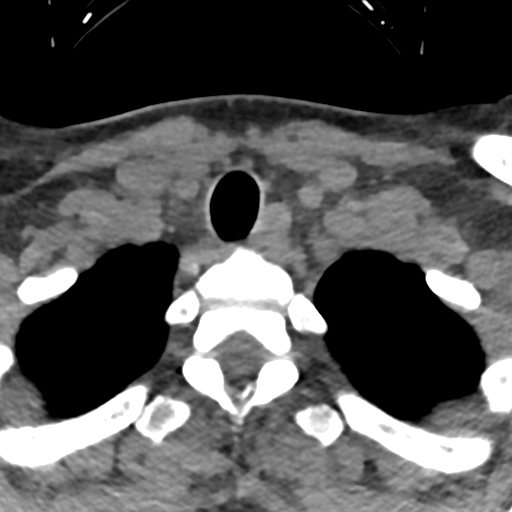
[im 16/93  bone]
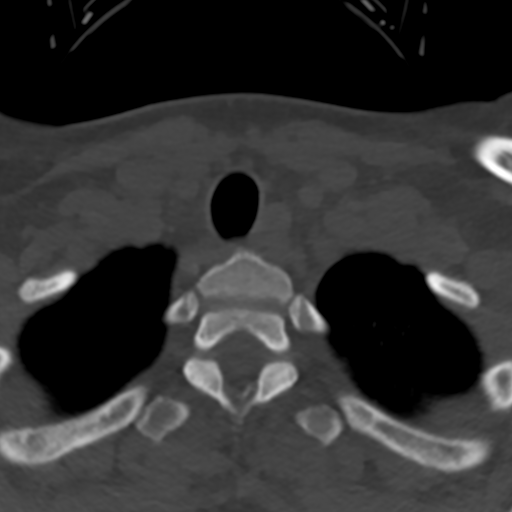
[im 47/93  bone]
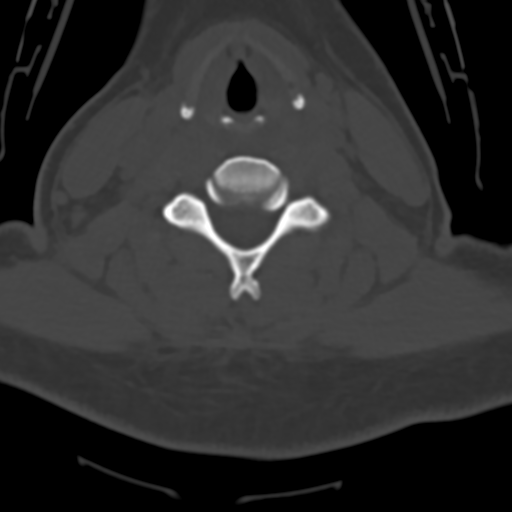
[im 77/93  bone]
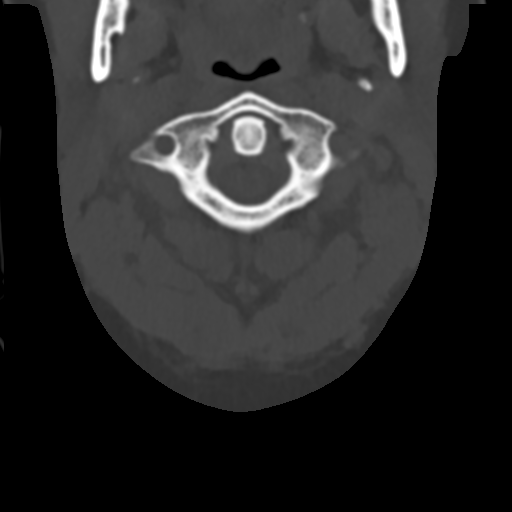

[Series 5: coronals · coronal · 0.26mm/px · 3 of 66 slices shown]
[im 14/66  bone]
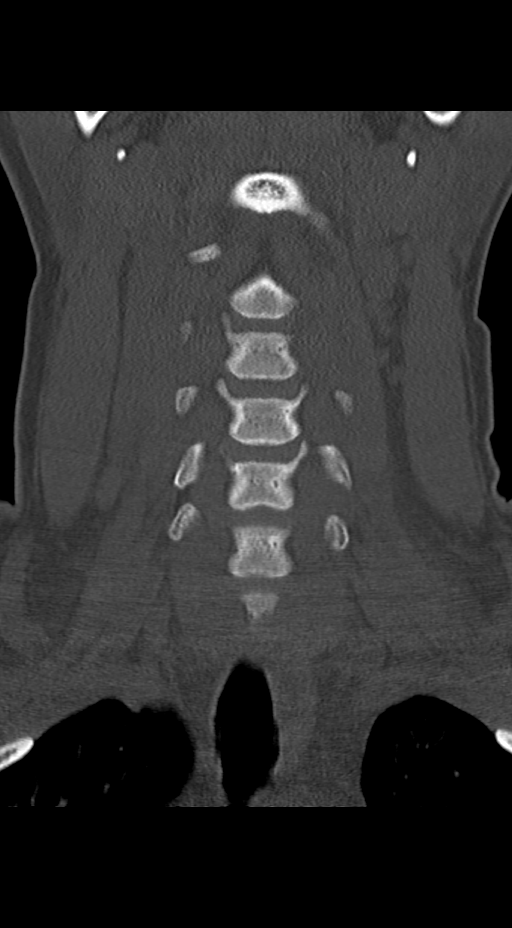
[im 27/66  bone]
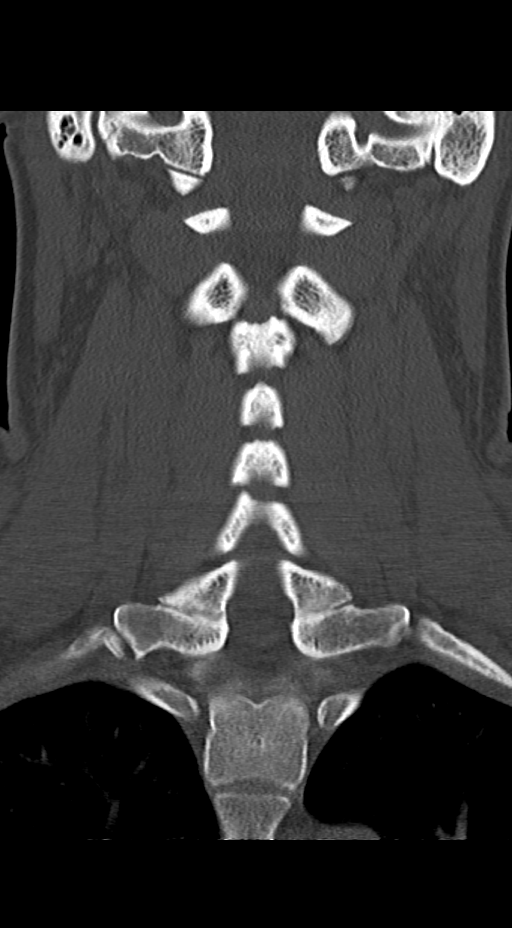
[im 40/66  bone]
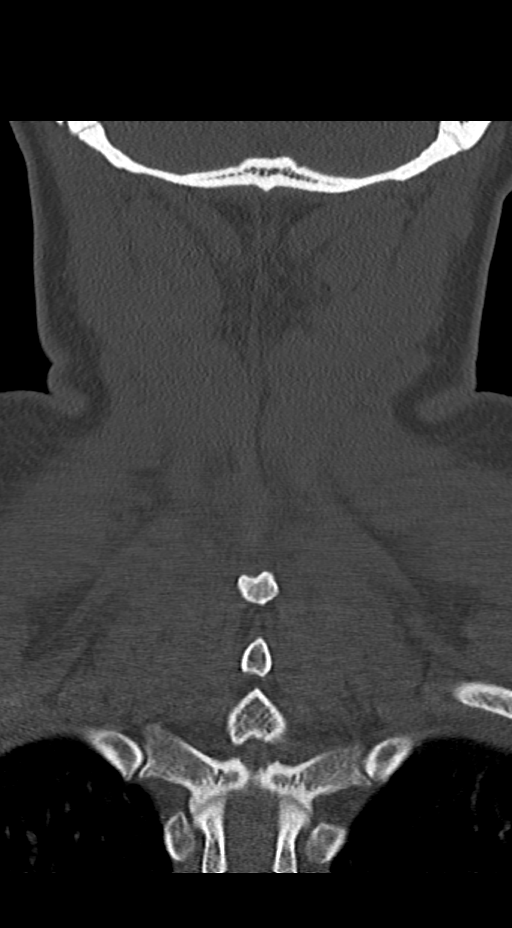

[Series 6: sagittals · sagittal · 0.27mm/px · 5 of 61 slices shown, 6 images]
[im 21/61  bone]
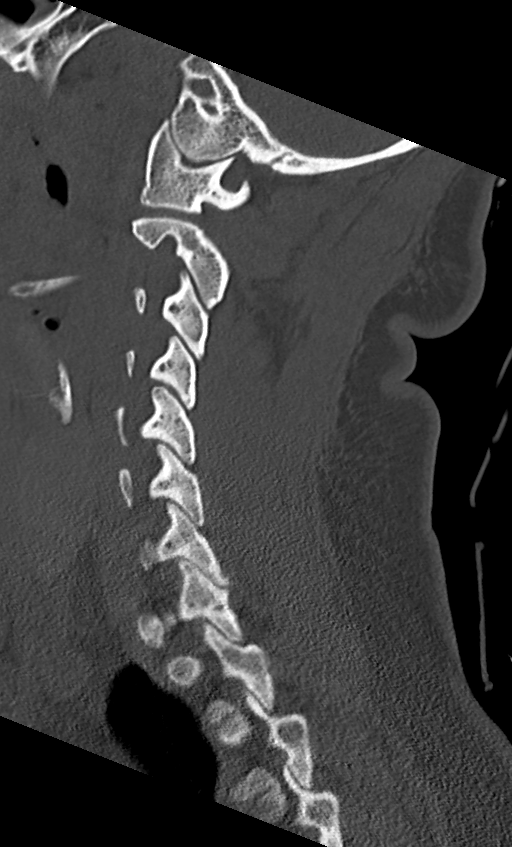
[im 26/61  bone]
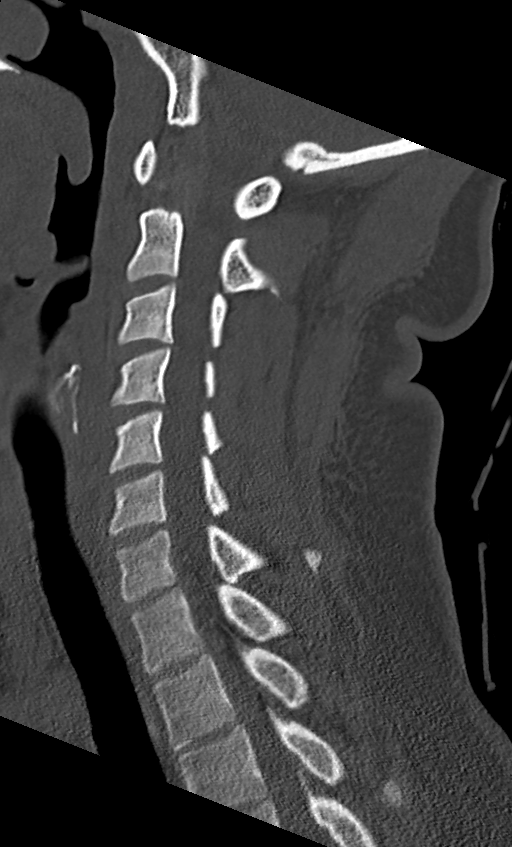
[im 31/61  soft-tissue]
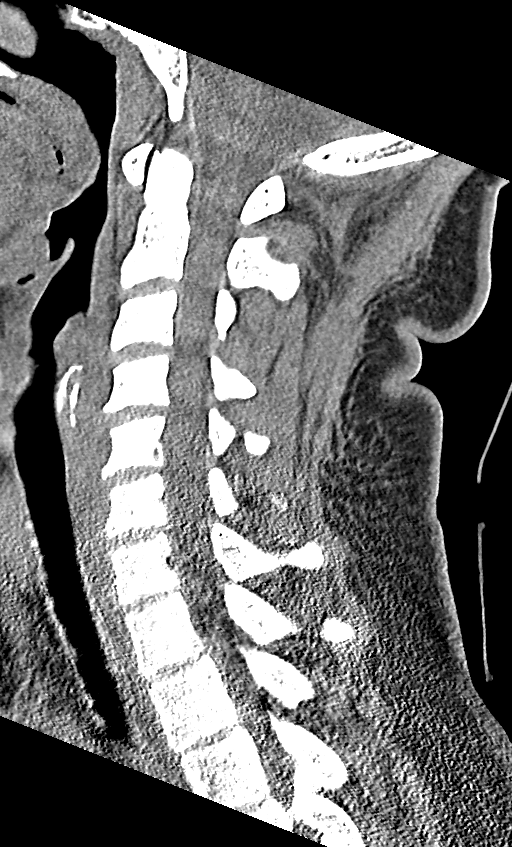
[im 31/61  bone]
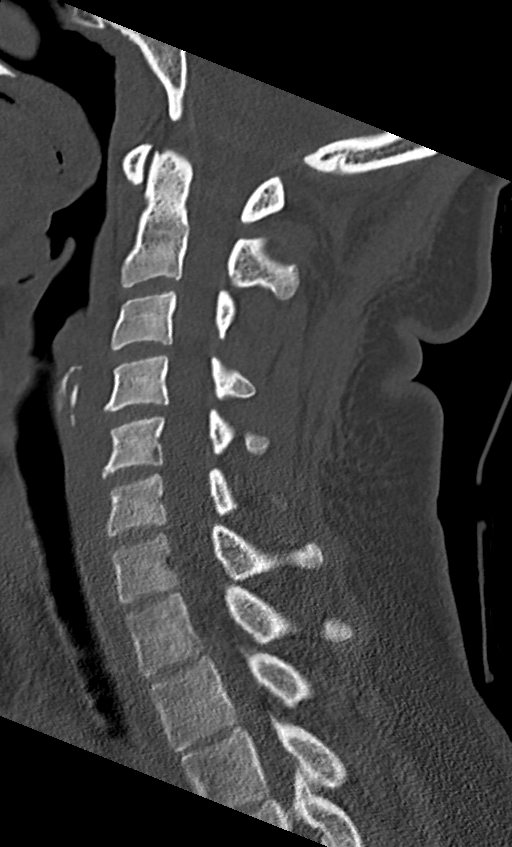
[im 36/61  bone]
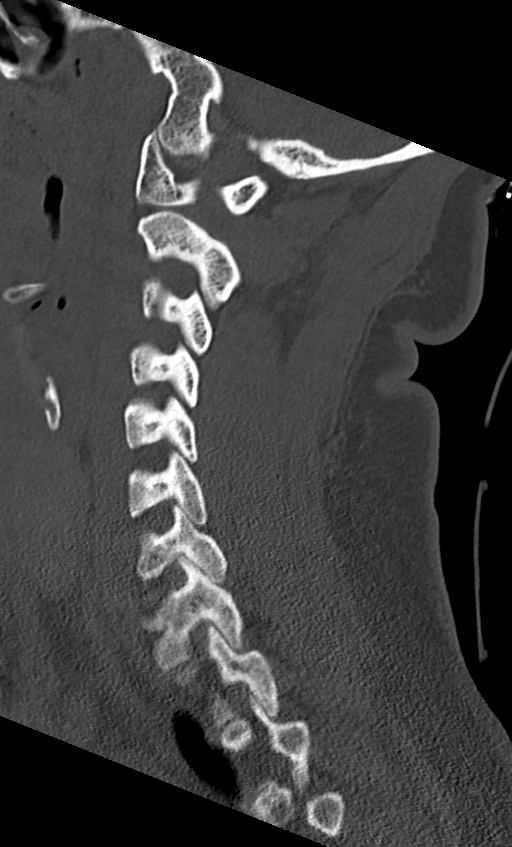
[im 41/61  bone]
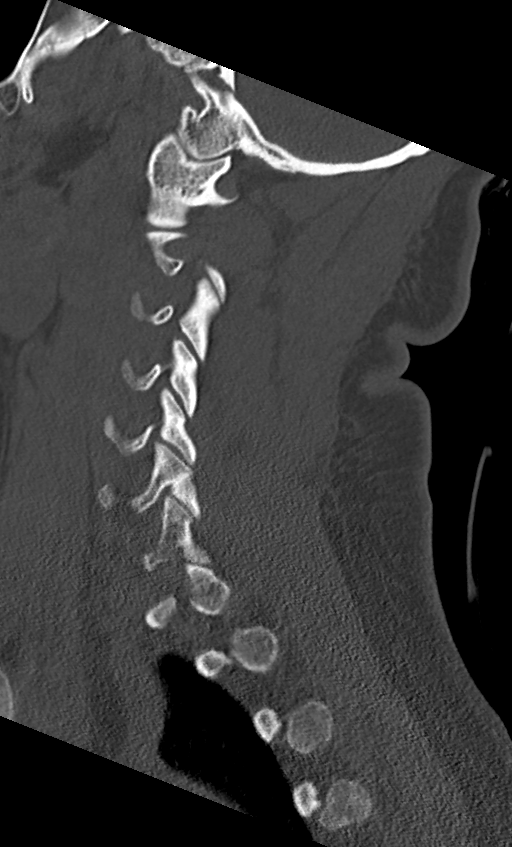

[11 of 33 positions shown; findings below may reference images not displayed]

FINDINGS: CT HEAD FINDINGS

Brain: No evidence of acute infarction, hemorrhage, hydrocephalus,
extra-axial collection or mass lesion/mass effect.

Vascular: No hyperdense vessel or unexpected calcification.

Skull: Normal. Negative for fracture or focal lesion.

Sinuses/Orbits: Minimal mucosal thickening right maxillary sinus
noted. Otherwise negative.

Other: None.

CT CERVICAL SPINE FINDINGS

Alignment: Normal.

Skull base and vertebrae: No acute fracture. No primary bone lesion
or focal pathologic process.

Soft tissues and spinal canal: No prevertebral fluid or swelling. No
visible canal hematoma.

Disc levels: Intervertebral disc space height is maintained at all
levels.

Upper chest: Clear.

Other: None.
IMPRESSION: Negative head and cervical spine CT scans.

## 2021-01-09 IMAGING — CR DG ELBOW COMPLETE 3+V*L*
4 series · 4 of 4 positions shown · non-contrast
Comparison: None.

CLINICAL DATA: Posttraumatic pain

EXAM:
LEFT ELBOW - COMPLETE 3+ VIEW

[x elbow joint ap left]
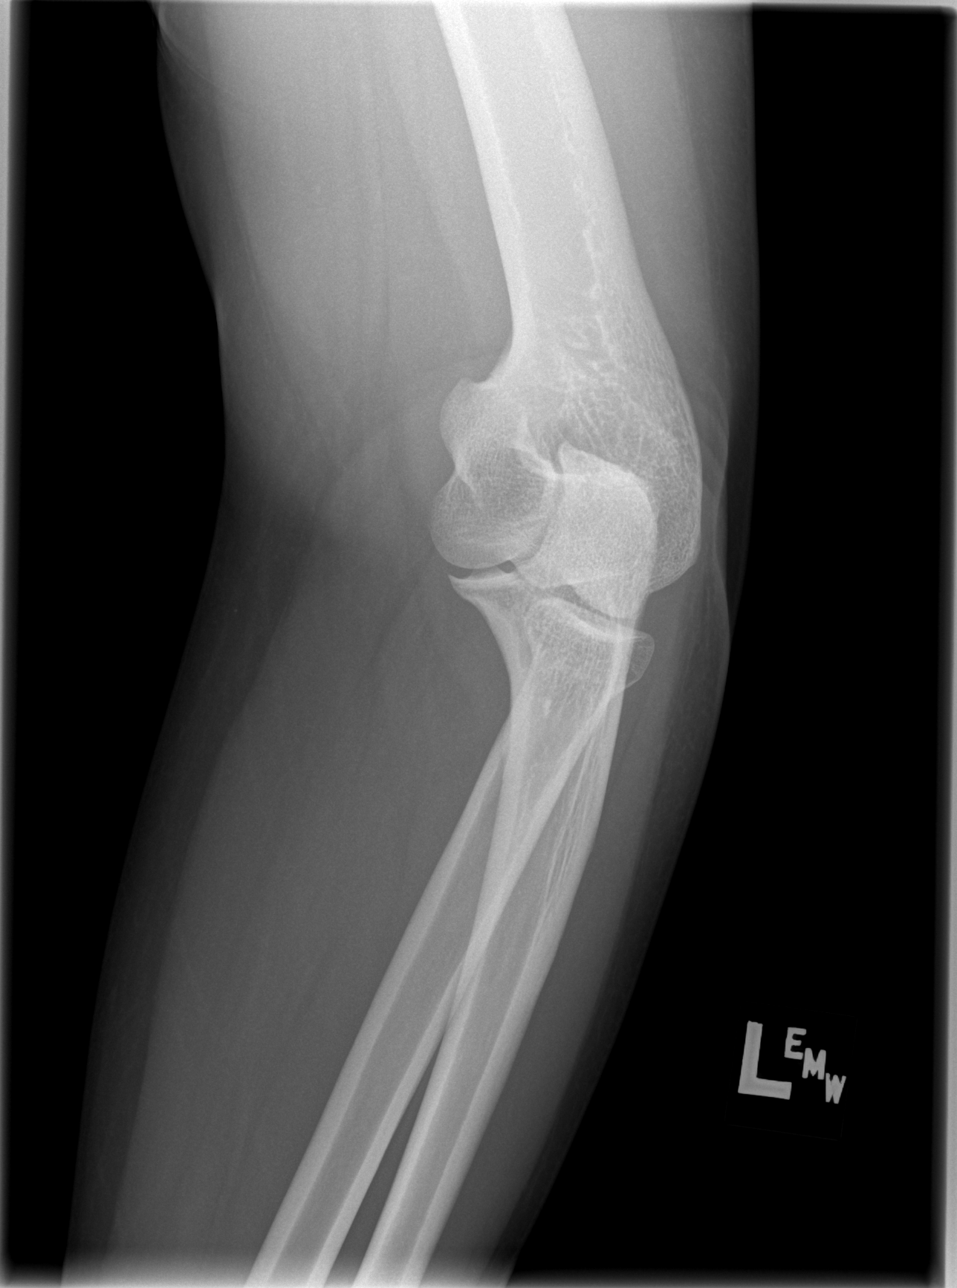

[x elbow joint obl. left (1 of 2)]
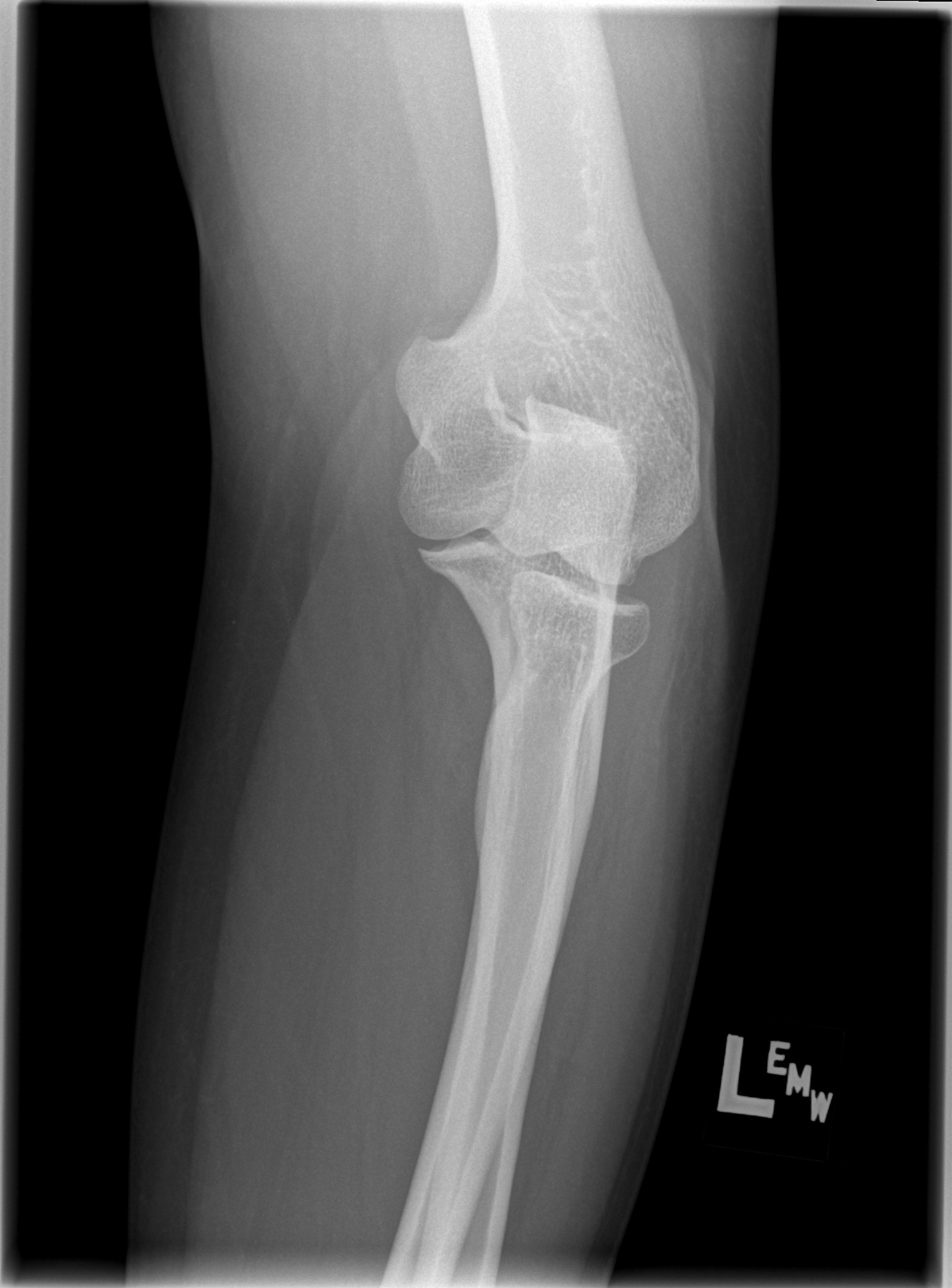

[x elbow joint obl. left (2 of 2)]
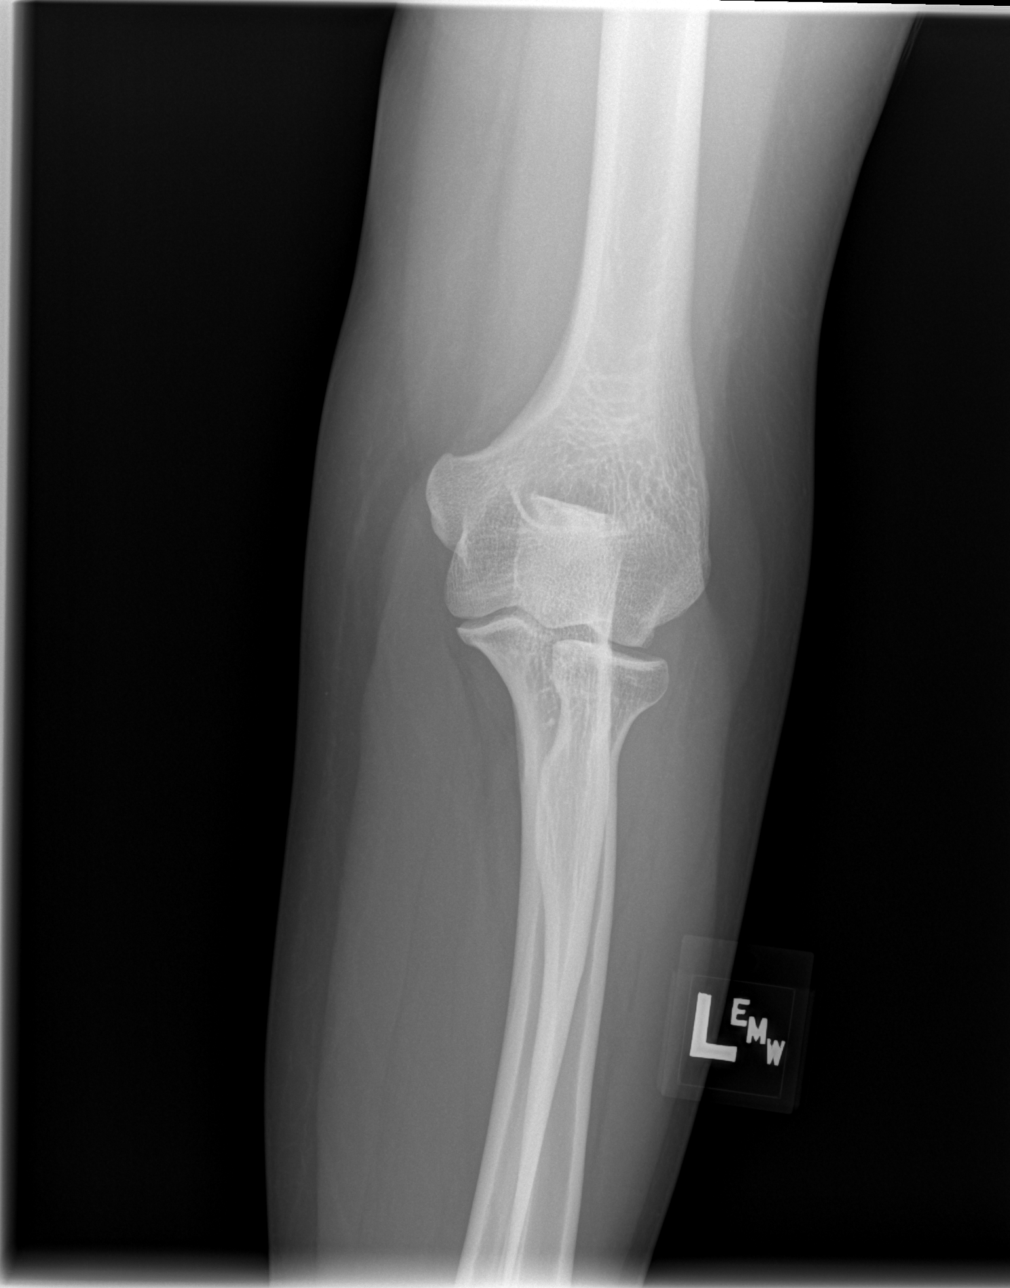

[x elbow joint lat left]
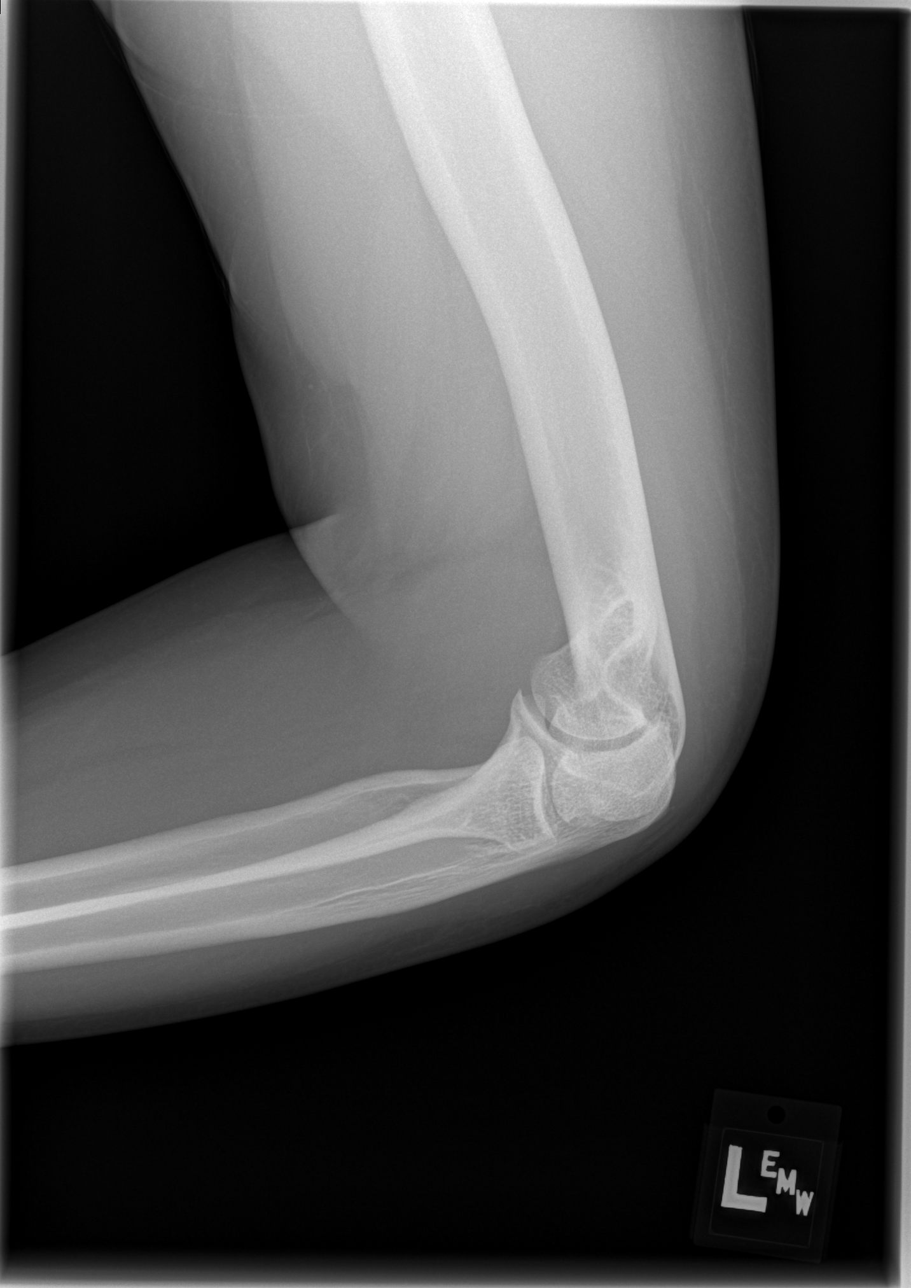

[4 of 4 positions shown; findings below may reference images not displayed]

FINDINGS: There is no evidence of fracture, dislocation, or joint effusion.
There is no evidence of arthropathy or other focal bone abnormality.
Soft tissues are unremarkable.
IMPRESSION: Negative.

## 2021-01-09 IMAGING — CT CT ABD-PELV W/ CM
2 of 5 series · 17 of 46 positions shown, 19 images · IV contrast (APPLIED)
Comparison: 01/19/2018

CLINICAL DATA: Blunt abdominal trauma, stable.

EXAM:
CT ABDOMEN AND PELVIS WITH CONTRAST
TECHNIQUE: Multidetector CT imaging of the abdomen and pelvis was performed
using the standard protocol following bolus administration of
intravenous contrast.
CONTRAST:  100mL OMNIPAQUE IOHEXOL 300 MG/ML  SOLN

[Series 2: axial st · axial · 0.81mm/px · z∈[-452,-42]mm · 14 of 94 slices shown, 16 images]
[im 6/94  soft-tissue]
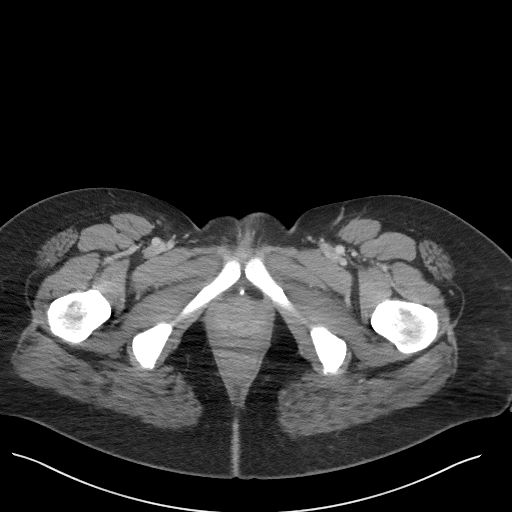
[im 6/94  bone]
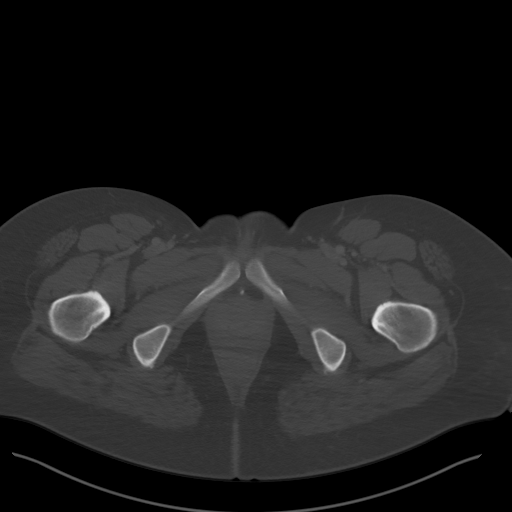
[im 11/94  soft-tissue]
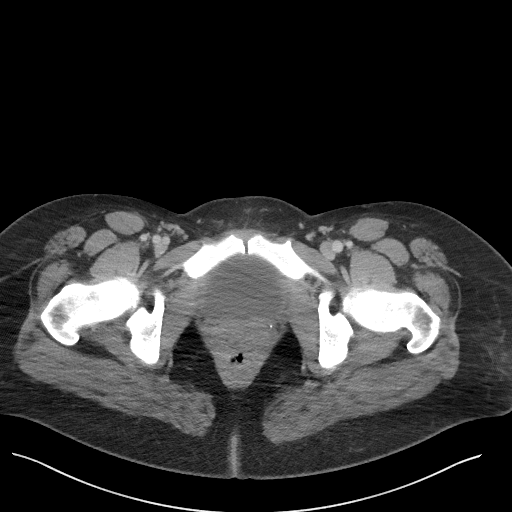
[im 21/94  soft-tissue]
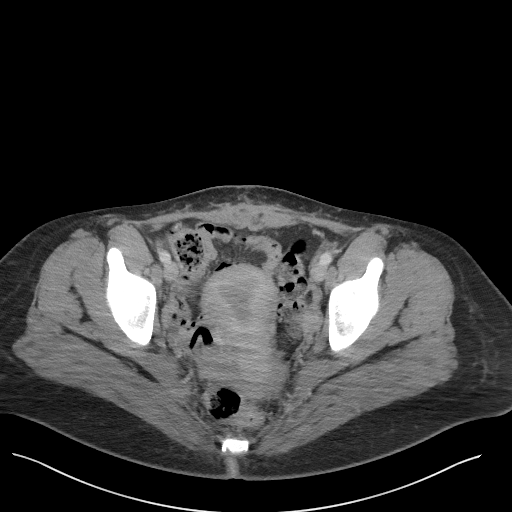
[im 26/94  soft-tissue]
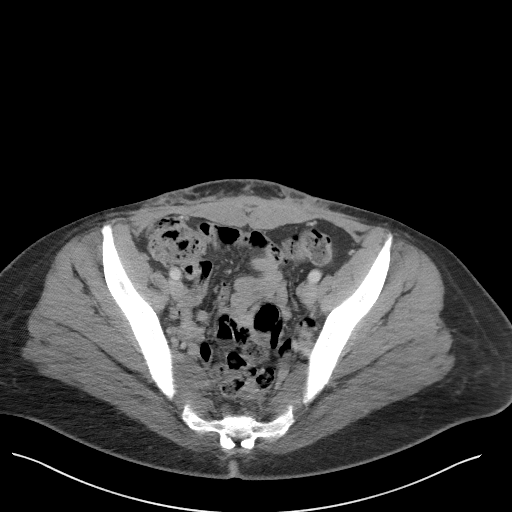
[im 32/94  soft-tissue]
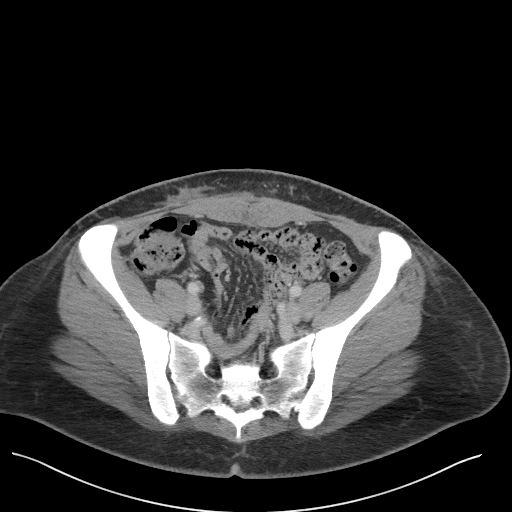
[im 37/94  soft-tissue]
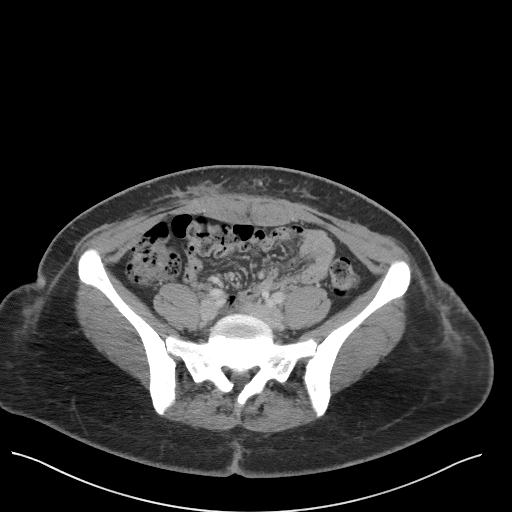
[im 42/94  soft-tissue]
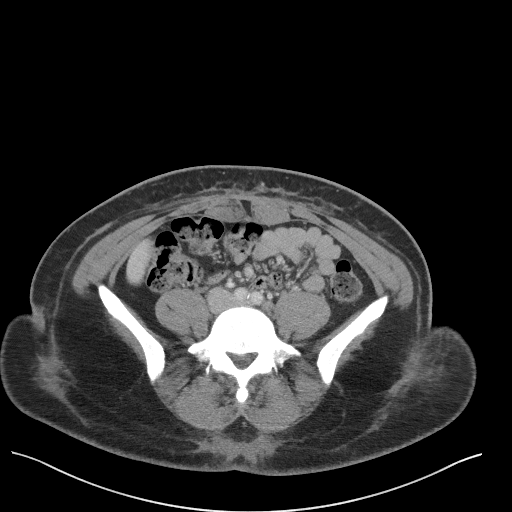
[im 52/94  soft-tissue]
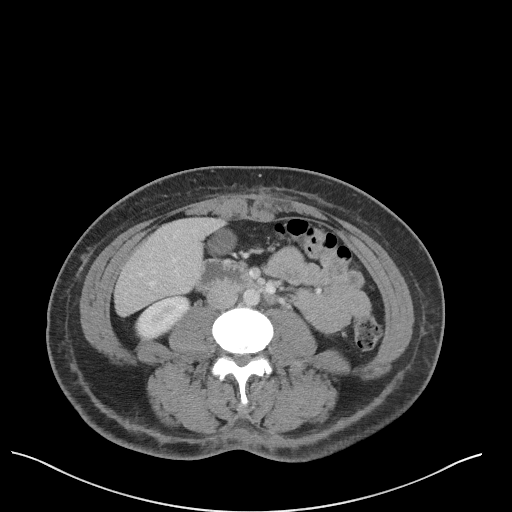
[im 57/94  soft-tissue]
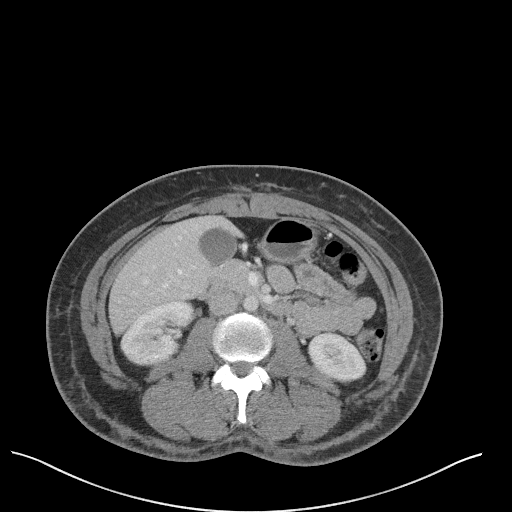
[im 57/94  bone]
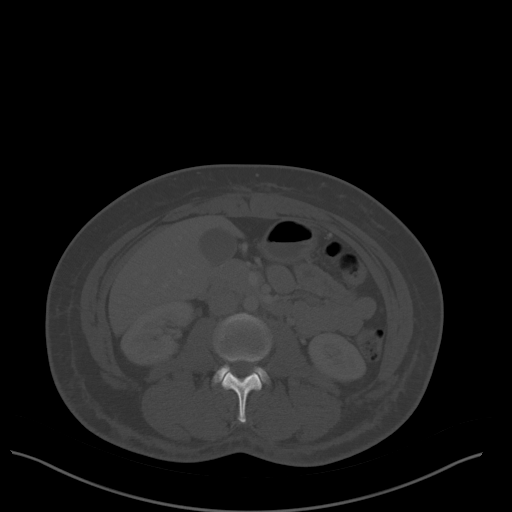
[im 63/94  soft-tissue]
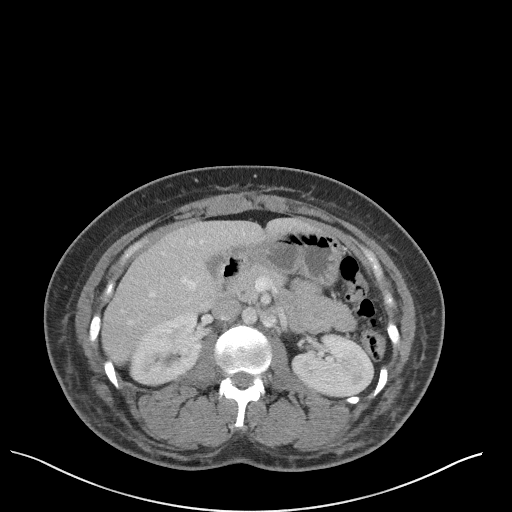
[im 68/94  soft-tissue]
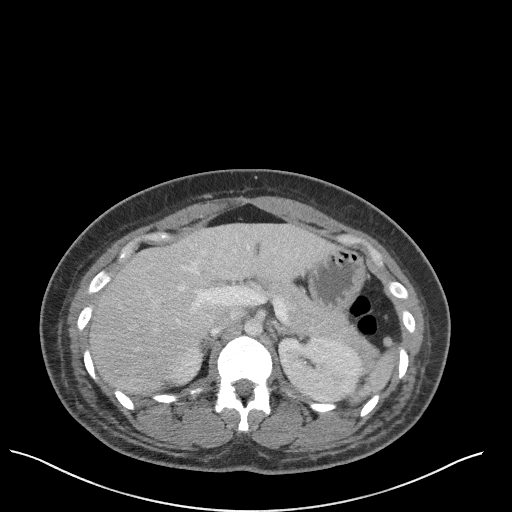
[im 73/94  soft-tissue]
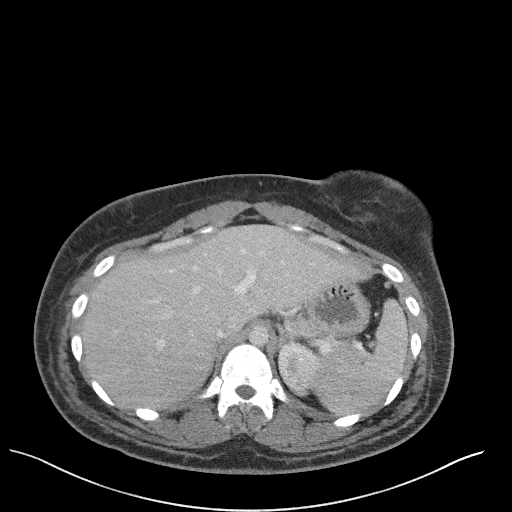
[im 83/94  soft-tissue]
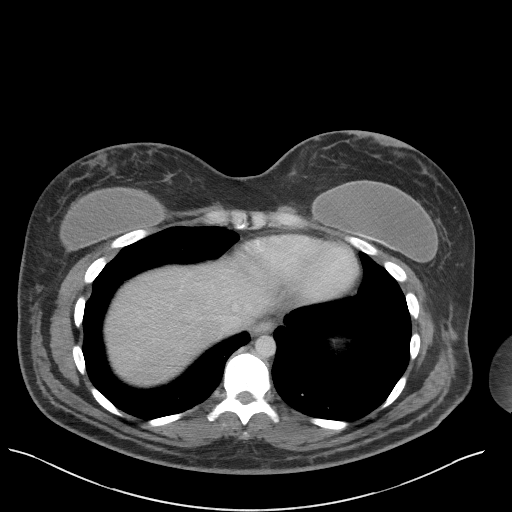
[im 88/94  soft-tissue]
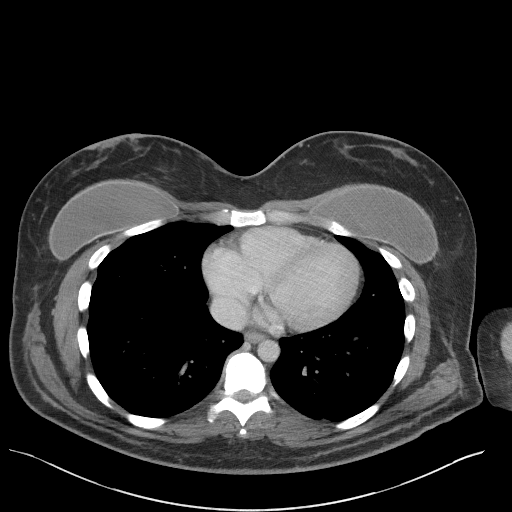

[Series 4: coronal st · coronal · 0.76mm/px · 3 of 88 slices shown]
[im 30/88  soft-tissue]
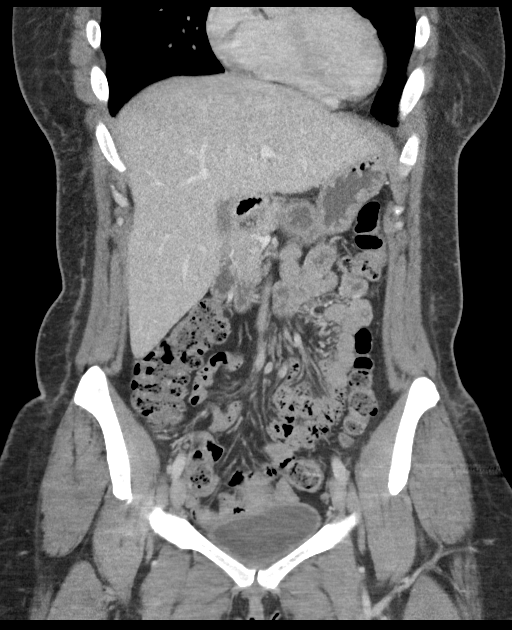
[im 39/88  soft-tissue]
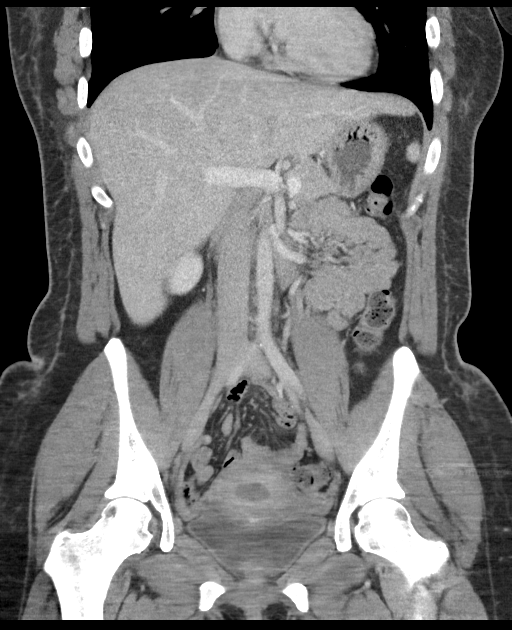
[im 49/88  soft-tissue]
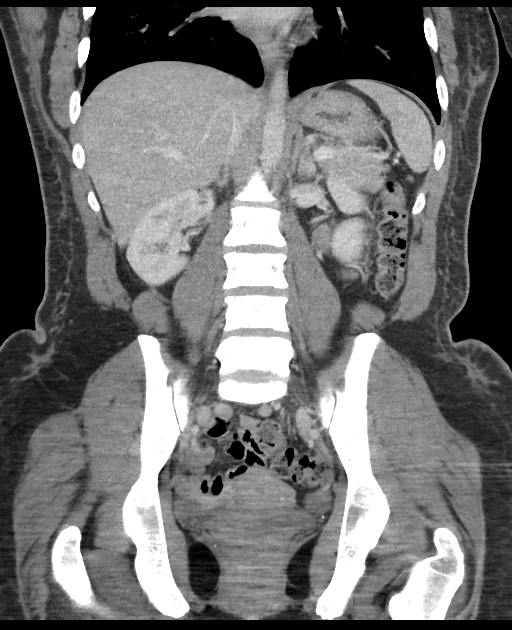

[17 of 46 positions shown; findings below may reference images not displayed]

FINDINGS: Lower chest: Unremarkable bilateral subpectoral breast implant,
incompletely covered on the right. No evidence of injury.

Hepatobiliary: No focal liver abnormality.No evidence of biliary
obstruction or stone.

Pancreas: Unremarkable.

Spleen: Unremarkable.

Adrenals/Urinary Tract: No evidence of adrenal or renal injury.
Negative bladder.

Stomach/Bowel: No evidence of injury. Formed stool throughout most
of the colon.

Vascular/Lymphatic: No acute vascular abnormality. No mass or
adenopathy.

Reproductive:Negative.

Other: No ascites or pneumoperitoneum. Cosmetic surgery with
generalized subcutaneous scar-like appearance. Fluid collections on
prior have resolved.

Musculoskeletal: No acute abnormalities.
IMPRESSION: No evidence of intra-abdominal injury.

## 2023-05-03 ENCOUNTER — Encounter: Payer: Self-pay | Admitting: Emergency Medicine

## 2023-05-03 ENCOUNTER — Ambulatory Visit
Admission: EM | Admit: 2023-05-03 | Discharge: 2023-05-03 | Disposition: A | Discharge status: Home / Self Care | Payer: Self-pay

## 2023-05-03 ENCOUNTER — Encounter: Payer: Self-pay | Admitting: Hematology

## 2023-05-03 DIAGNOSIS — J069 Acute upper respiratory infection, unspecified: Secondary | ICD-10-CM

## 2023-05-03 DIAGNOSIS — H66003 Acute suppurative otitis media without spontaneous rupture of ear drum, bilateral: Secondary | ICD-10-CM

## 2023-05-03 MED ORDER — BENZONATATE 100 MG PO CAPS: 200.0000 mg | ORAL_CAPSULE | Freq: Three times a day (TID) | ORAL | 0 refills | Status: AC

## 2023-05-03 MED ORDER — AMOXICILLIN-POT CLAVULANATE 875-125 MG PO TABS: 1.0000 | ORAL_TABLET | Freq: Two times a day (BID) | ORAL | 0 refills | Status: AC

## 2023-05-03 MED ORDER — PROMETHAZINE-DM 6.25-15 MG/5ML PO SYRP: 5.0000 mL | ORAL_SOLUTION | Freq: Four times a day (QID) | ORAL | 0 refills | Status: AC | PRN

## 2023-05-03 MED ORDER — IPRATROPIUM BROMIDE 0.06 % NA SOLN: 2.0000 | Freq: Four times a day (QID) | NASAL | 12 refills | Status: AC

## 2023-05-03 NOTE — ED Triage Notes (Signed)
Pt presents with a cough, chest congestion and sore throat x 1 week.

## 2023-05-03 NOTE — Discharge Instructions (Signed)
Take the Augmentin twice daily for 10 days with food for treatment of your ear infection.  Take an over-the-counter probiotic 1 hour after each dose of antibiotic to prevent diarrhea.  Use over-the-counter Tylenol and ibuprofen as needed for pain or fever.  Place a hot water bottle, or heating pad, underneath your pillowcase at night to help dilate up your ear and aid in pain relief as well as resolution of the infection.  Use the Atrovent nasal spray, 2 squirts in each nostril every 6 hours, as needed for runny nose and postnasal drip.  Use the Tessalon Perles every 8 hours during the day.  Take them with a small sip of water.  They may give you some numbness to the base of your tongue or a metallic taste in your mouth, this is normal.  Use the Promethazine DM cough syrup at bedtime for cough and congestion.  It will make you drowsy so do not take it during the day.  Return for reevaluation or see your primary care provider for any new or worsening symptoms.

## 2023-05-03 NOTE — ED Provider Notes (Signed)
[facility]    CSN: [id number] Arrival date & time: 05/03/23  1816      History   Chief Complaint Chief Complaint  Patient presents with   Cough   Sore Throat    HPI Erin Boyd is a 37 y.o. female.   HPI  37 year old female with a past medical history significant for headache and DVT presents for evaluation of greater than 1 week worth of respiratory symptoms which include runny nose, nasal congestion, sore throat, bilateral ear pain, productive cough, shortness breath, and wheezing.  She reports that her youngest daughter was sick over a month ago but no one else that she has been around recently has been sick.  Past Medical History:  Diagnosis Date   Cervical abnormality affecting pregnancy, antepartum 08/25/2015   DVT (deep vein thrombosis) in pregnancy    dx clot in right arm in Jan 2017.  Been off Lovenox since Friday 08/23/15    Headache    otc med prn   Heartburn in pregnancy    No pertinent past medical history    Short cervix with cervical cerclage in second trimester, antepartum 09/06/2015    Patient Active Problem List   Diagnosis Date Noted   Syncope    Sepsis (HCC) 01/19/2018   Iron deficiency anemia due to chronic blood loss 12/24/2017   Breech birth 11/06/2015   Preterm premature rupture of membranes (PPROM) with unknown onset of labor 10/09/2015   Short cervix with cervical cerclage in second trimester, antepartum 09/06/2015   Cervical abnormality affecting pregnancy, antepartum 08/25/2015   DVT of upper extremity (deep vein thrombosis) (HCC) 07/06/2015   UTI (urinary tract infection) 12/12/2013   Drug overdose 12/11/2013   Metabolic acidosis 12/11/2013   Wolff-Parkinson-White (WPW) pattern 12/11/2013    Past Surgical History:  Procedure Laterality Date   abdominoplasty, augmentation mammaplasty, liposuction back thighs arms  01/01/2018   [place]   AUGMENTATION MAMMAPLASTY     CERVICAL CERCLAGE N/A  09/06/2015   Procedure: CERCLAGE CERVICAL;  Surgeon: Sherian Rein, MD;  Location: [facility];  Service: Gynecology;  Laterality: N/A;   CESAREAN SECTION N/A 11/06/2015   Procedure: CESAREAN SECTION;  Surgeon: Huel Cote, MD;  Location: [facility];  Service: Obstetrics;  Laterality: N/A;   EXCISION MASS ABDOMINAL N/A 01/19/2018   Procedure: incision and drainage of the abdomen;  Surgeon: Glenna Fellows, MD;  Location: [facility] OR;  Service: Plastics;  Laterality: N/A;   INCISION AND DRAINAGE OF WOUND N/A 01/19/2018   Procedure: icision and drainage of back;  Surgeon: Glenna Fellows, MD;  Location: [facility] OR;  Service: Plastics;  Laterality: N/A;   INDUCED ABORTION     pediatric dental surgery     WISDOM TOOTH EXTRACTION      OB History     Gravida  6   Para  2   Term  1   Preterm  1   AB  3   Living  1      SAB  1   IAB  2   Ectopic  0   Multiple  0   Live Births  1            Home Medications    Prior to Admission medications   Medication Sig Start Date End Date Taking? Authorizing Provider  amoxicillin-clavulanate (AUGMENTIN) 875-125 MG tablet Take 1 tablet by mouth every 12 (twelve) hours for 10 days. 05/03/23 05/13/23 Yes Becky Augusta, NP  benzonatate (TESSALON) 100 MG capsule Take  2 capsules (200 mg total) by mouth every 8 (eight) hours. 05/03/23  Yes Becky Augusta, NP  ipratropium (ATROVENT) 0.06 % nasal spray Place 2 sprays into both nostrils 4 (four) times daily. 05/03/23  Yes Becky Augusta, NP  promethazine-dextromethorphan (PROMETHAZINE-DM) 6.25-15 MG/5ML syrup Take 5 mLs by mouth 4 (four) times daily as needed. 05/03/23  Yes Becky Augusta, NP  methocarbamol (ROBAXIN) 500 MG tablet Take 1 tablet (500 mg total) by mouth 2 (two) times daily. 12/22/20   Gailen Shelter, PA  metroNIDAZOLE (FLAGYL) 500 MG tablet Take 1 tablet (500 mg total) by mouth 2 (two) times daily. 03/31/19   Cristina Gong, PA-C  ondansetron (ZOFRAN ODT) 4 MG disintegrating  tablet Take 1 tablet (4 mg total) by mouth every 8 (eight) hours as needed for nausea or vomiting. 12/22/20   Gailen Shelter, PA  paragard intrauterine copper IUD IUD Take 1 device by intrauterine route. 12/24/15   [provider]  phentermine 30 MG capsule Take 30 mg by mouth every morning.  03/21/19  [provider]    Family History Family History  Problem Relation Age of Onset   Clotting disorder Neg Hx     Social History Social History   Tobacco Use   Smoking status: Former    Current packs/day: 0.00    Average packs/day: 0.5 packs/day for 7.0 years (3.5 ttl pk-yrs)    Types: Cigarettes    Start date: 06/09/2007    Quit date: 06/08/2014    Years since quitting: 8.9   Smokeless tobacco: Never  Vaping Use   Vaping status: Never Used  Substance Use Topics   Alcohol use: Yes    Comment: occ   Drug use: No     Allergies   Other and Latex   Review of Systems Review of Systems  Constitutional:  Positive for fever.  HENT:  Positive for congestion, ear pain, rhinorrhea and sore throat.   Respiratory:  Positive for cough, shortness of breath and wheezing.      Physical Exam Triage Vital Signs ED Triage Vitals  Encounter Vitals Group     BP 05/03/23 1913 120/84     Systolic BP Percentile --      Diastolic BP Percentile --      Pulse Rate 05/03/23 1913 88     Resp 05/03/23 1913 18     Temp 05/03/23 1913 98.4 F (36.9 C)     Temp Source 05/03/23 1913 Oral     SpO2 05/03/23 1913 95 %     Weight --      Height --      Head Circumference --      Peak Flow --      Pain Score 05/03/23 1911 6     Pain Loc --      Pain Education --      Exclude from Growth Chart --    No data found.  Updated Vital Signs BP 120/84 (BP Location: Right Arm)   Pulse 88   Temp 98.4 F (36.9 C) (Oral)   Resp 18   LMP  (LMP Unknown)   SpO2 95%   Visual Acuity Right Eye Distance:   Left Eye Distance:   Bilateral Distance:    Right Eye Near:   Left Eye Near:     Bilateral Near:     Physical Exam Vitals and nursing note reviewed.  Constitutional:      Appearance: Normal appearance. She is not ill-appearing.  HENT:  Head: Normocephalic and atraumatic.     Right Ear: Ear canal and external ear normal. There is no impacted cerumen.     Left Ear: Ear canal and external ear normal. There is no impacted cerumen.     Ears:     Comments: Bilateral tympanic membranes are erythematous and injected with loss of landmarks.  Both EACs are clear.    Nose: Congestion and rhinorrhea present.     Comments: Mucosa is erythematous and edematous with clear discharge in both nares.    Mouth/Throat:     Mouth: Mucous membranes are moist.     Pharynx: Oropharynx is clear. Posterior oropharyngeal erythema present. No oropharyngeal exudate.     Comments: Mild erythema to the posterior oropharynx with clear postnasal drip. Cardiovascular:     Rate and Rhythm: Normal rate and regular rhythm.     Pulses: Normal pulses.     Heart sounds: Normal heart sounds. No murmur heard.    No friction rub. No gallop.  Pulmonary:     Effort: Pulmonary effort is normal.     Breath sounds: Normal breath sounds. No wheezing, rhonchi or rales.  Musculoskeletal:     Cervical back: Normal range of motion and neck supple. No tenderness.  Lymphadenopathy:     Cervical: No cervical adenopathy.  Skin:    General: Skin is warm and dry.     Capillary Refill: Capillary refill takes less than 2 seconds.     Findings: No rash.  Neurological:     General: No focal deficit present.     Mental Status: She is alert and oriented to person, place, and time.      UC Treatments / Results  Labs (all labs ordered are listed, but only abnormal results are displayed) Labs Reviewed - No data to display  EKG   Radiology No results found.  Procedures Procedures (including critical care time)  Medications Ordered in UC Medications - No data to display  Initial Impression / Assessment  and Plan / UC Course  I have reviewed the triage vital signs and the nursing notes.  Pertinent labs & imaging results that were available during my care of the patient were reviewed by me and considered in my medical decision making (see chart for details).   Patient is a nontoxic-appearing 37 year old female presenting for evaluation of greater than 1 week with the respiratory symptoms outlined HPI above.  On exam both of her tympanic membrane's are erythematous and injected indicating otitis media.  She has inflamed nasal mucosa but her rhinorrhea is clear as is her postnasal drip.  No cervical of adenopathy.  Cardiopulmonary dam is benign.  I will discharge patient on the diagnosis of URI and bilateral otitis media and started on Augmentin 875 twice daily for 10 days.  Atrovent nasal spray for the nasal congestion.  Tylenol and/or ibuprofen seen for fever.  Tessalon Perles and Promethazine DM for cough and congestion.  Return precautions reviewed.  Work note provided.   Final Clinical Impressions(s) / UC Diagnoses   Final diagnoses:  Upper respiratory tract infection, unspecified type  Non-recurrent acute suppurative otitis media of both ears without spontaneous rupture of tympanic membranes     Discharge Instructions      Take the Augmentin twice daily for 10 days with food for treatment of your ear infection.  Take an over-the-counter probiotic 1 hour after each dose of antibiotic to prevent diarrhea.  Use over-the-counter Tylenol and ibuprofen as needed for pain or fever.  Place  a hot water bottle, or heating pad, underneath your pillowcase at night to help dilate up your ear and aid in pain relief as well as resolution of the infection.  Use the Atrovent nasal spray, 2 squirts in each nostril every 6 hours, as needed for runny nose and postnasal drip.  Use the Tessalon Perles every 8 hours during the day.  Take them with a small sip of water.  They may give you some numbness to  the base of your tongue or a metallic taste in your mouth, this is normal.  Use the Promethazine DM cough syrup at bedtime for cough and congestion.  It will make you drowsy so do not take it during the day.  Return for reevaluation or see your primary care provider for any new or worsening symptoms.      ED Prescriptions     Medication Sig Dispense Auth. Provider   amoxicillin-clavulanate (AUGMENTIN) 875-125 MG tablet Take 1 tablet by mouth every 12 (twelve) hours for 10 days. 20 tablet Becky Augusta, NP   benzonatate (TESSALON) 100 MG capsule Take 2 capsules (200 mg total) by mouth every 8 (eight) hours. 21 capsule Becky Augusta, NP   ipratropium (ATROVENT) 0.06 % nasal spray Place 2 sprays into both nostrils 4 (four) times daily. 15 mL Becky Augusta, NP   promethazine-dextromethorphan (PROMETHAZINE-DM) 6.25-15 MG/5ML syrup Take 5 mLs by mouth 4 (four) times daily as needed. 118 mL Becky Augusta, NP      PDMP not reviewed this encounter.   Becky Augusta, NP 05/03/23 1941

## 2024-07-11 ENCOUNTER — Other Ambulatory Visit (HOSPITAL_COMMUNITY): Payer: Self-pay
